# Patient Record
Sex: Female | Born: 1980 | Race: White | Hispanic: No | Smoking: Former smoker
Health system: Southern US, Community
[De-identification: ages and names within clinical notes are randomized; demographics above are authoritative.]

## PROBLEM LIST (undated history)

## (undated) DIAGNOSIS — F1911 Other psychoactive substance abuse, in remission: Secondary | ICD-10-CM

## (undated) DIAGNOSIS — F1910 Other psychoactive substance abuse, uncomplicated: Secondary | ICD-10-CM

## (undated) DIAGNOSIS — R001 Bradycardia, unspecified: Secondary | ICD-10-CM

## (undated) DIAGNOSIS — R55 Syncope and collapse: Secondary | ICD-10-CM

## (undated) DIAGNOSIS — F329 Major depressive disorder, single episode, unspecified: Secondary | ICD-10-CM

## (undated) DIAGNOSIS — F32A Depression, unspecified: Secondary | ICD-10-CM

## (undated) DIAGNOSIS — H9190 Unspecified hearing loss, unspecified ear: Secondary | ICD-10-CM

## (undated) DIAGNOSIS — R45851 Suicidal ideations: Secondary | ICD-10-CM

## (undated) DIAGNOSIS — E039 Hypothyroidism, unspecified: Secondary | ICD-10-CM

## (undated) DIAGNOSIS — S0990XA Unspecified injury of head, initial encounter: Secondary | ICD-10-CM

## (undated) DIAGNOSIS — T7840XA Allergy, unspecified, initial encounter: Secondary | ICD-10-CM

## (undated) DIAGNOSIS — N390 Urinary tract infection, site not specified: Secondary | ICD-10-CM

## (undated) DIAGNOSIS — F410 Panic disorder [episodic paroxysmal anxiety] without agoraphobia: Secondary | ICD-10-CM

## (undated) DIAGNOSIS — F319 Bipolar disorder, unspecified: Secondary | ICD-10-CM

## (undated) DIAGNOSIS — A6000 Herpesviral infection of urogenital system, unspecified: Secondary | ICD-10-CM

## (undated) DIAGNOSIS — I619 Nontraumatic intracerebral hemorrhage, unspecified: Secondary | ICD-10-CM

## (undated) DIAGNOSIS — G43909 Migraine, unspecified, not intractable, without status migrainosus: Secondary | ICD-10-CM

## (undated) DIAGNOSIS — F4310 Post-traumatic stress disorder, unspecified: Secondary | ICD-10-CM

## (undated) DIAGNOSIS — R0789 Other chest pain: Secondary | ICD-10-CM

## (undated) DIAGNOSIS — R768 Other specified abnormal immunological findings in serum: Secondary | ICD-10-CM

## (undated) DIAGNOSIS — G40909 Epilepsy, unspecified, not intractable, without status epilepticus: Secondary | ICD-10-CM

## (undated) DIAGNOSIS — K219 Gastro-esophageal reflux disease without esophagitis: Secondary | ICD-10-CM

## (undated) HISTORY — DX: Unspecified hearing loss, unspecified ear: H91.90

## (undated) HISTORY — DX: Unspecified injury of head, initial encounter: S09.90XA

## (undated) HISTORY — DX: Epilepsy, unspecified, not intractable, without status epilepticus: G40.909

## (undated) HISTORY — DX: Migraine, unspecified, not intractable, without status migrainosus: G43.909

## (undated) HISTORY — PX: OTHER SURGICAL HISTORY: SHX169

## (undated) HISTORY — DX: Bipolar disorder, unspecified: F31.9

## (undated) HISTORY — DX: Other chest pain: R07.89

## (undated) HISTORY — DX: Other psychoactive substance abuse, in remission: F19.11

## (undated) HISTORY — DX: Syncope and collapse: R55

## (undated) HISTORY — PX: WISDOM TOOTH EXTRACTION: SHX21

## (undated) HISTORY — DX: Post-traumatic stress disorder, unspecified: F43.10

## (undated) HISTORY — PX: HYSTEROSCOPY W/ ENDOMETRIAL ABLATION: SUR665

## (undated) HISTORY — PX: TONSILLECTOMY: SUR1361

## (undated) HISTORY — PX: REDUCTION MAMMAPLASTY: SUR839

## (undated) HISTORY — DX: Bradycardia, unspecified: R00.1

## (undated) HISTORY — DX: Herpesviral infection of urogenital system, unspecified: A60.00

## (undated) HISTORY — DX: Urinary tract infection, site not specified: N39.0

## (undated) HISTORY — DX: Allergy, unspecified, initial encounter: T78.40XA

---

## 2003-02-02 DIAGNOSIS — S0990XA Unspecified injury of head, initial encounter: Secondary | ICD-10-CM

## 2003-02-02 DIAGNOSIS — H9190 Unspecified hearing loss, unspecified ear: Secondary | ICD-10-CM

## 2003-02-02 HISTORY — DX: Unspecified hearing loss, unspecified ear: H91.90

## 2003-02-02 HISTORY — DX: Unspecified injury of head, initial encounter: S09.90XA

## 2003-11-16 ENCOUNTER — Emergency Department: Payer: Self-pay | Admitting: Emergency Medicine

## 2004-02-18 ENCOUNTER — Emergency Department: Payer: Self-pay | Admitting: Emergency Medicine

## 2005-04-08 ENCOUNTER — Emergency Department: Payer: Self-pay | Admitting: Emergency Medicine

## 2005-08-19 ENCOUNTER — Inpatient Hospital Stay (HOSPITAL_COMMUNITY): Admission: AD | Admit: 2005-08-19 | Discharge: 2005-08-19 | Payer: Self-pay | Admitting: Family Medicine

## 2005-10-21 ENCOUNTER — Ambulatory Visit: Payer: Self-pay | Admitting: Family Medicine

## 2005-10-28 ENCOUNTER — Ambulatory Visit: Admission: RE | Admit: 2005-10-28 | Discharge: 2005-10-28 | Payer: Self-pay | Admitting: Family Medicine

## 2005-11-09 ENCOUNTER — Ambulatory Visit (HOSPITAL_COMMUNITY): Admission: RE | Admit: 2005-11-09 | Discharge: 2005-11-09 | Payer: Self-pay | Admitting: Obstetrics & Gynecology

## 2006-02-08 ENCOUNTER — Ambulatory Visit (HOSPITAL_COMMUNITY): Admission: RE | Admit: 2006-02-08 | Discharge: 2006-02-08 | Payer: Self-pay | Admitting: Obstetrics & Gynecology

## 2006-03-10 ENCOUNTER — Ambulatory Visit: Payer: Self-pay | Admitting: *Deleted

## 2006-03-10 ENCOUNTER — Inpatient Hospital Stay (HOSPITAL_COMMUNITY): Admission: AD | Admit: 2006-03-10 | Discharge: 2006-03-10 | Payer: Self-pay | Admitting: Family Medicine

## 2006-03-23 ENCOUNTER — Inpatient Hospital Stay (HOSPITAL_COMMUNITY): Admission: RE | Admit: 2006-03-23 | Discharge: 2006-03-26 | Payer: Self-pay | Admitting: Obstetrics & Gynecology

## 2006-03-23 ENCOUNTER — Ambulatory Visit: Payer: Self-pay | Admitting: Obstetrics & Gynecology

## 2007-06-02 ENCOUNTER — Ambulatory Visit: Payer: Self-pay

## 2008-12-04 ENCOUNTER — Ambulatory Visit: Payer: Self-pay | Admitting: Family Medicine

## 2008-12-19 ENCOUNTER — Ambulatory Visit: Payer: Self-pay | Admitting: Obstetrics & Gynecology

## 2008-12-19 LAB — CONVERTED CEMR LAB: GC Probe Amp, Genital: NEGATIVE

## 2009-02-06 ENCOUNTER — Other Ambulatory Visit
Admission: RE | Admit: 2009-02-06 | Discharge: 2009-02-06 | Payer: Self-pay | Source: Home / Self Care | Admitting: Obstetrics & Gynecology

## 2009-02-06 ENCOUNTER — Ambulatory Visit: Payer: Self-pay | Admitting: Obstetrics and Gynecology

## 2009-02-14 ENCOUNTER — Emergency Department: Payer: Self-pay | Admitting: Emergency Medicine

## 2009-02-18 ENCOUNTER — Ambulatory Visit: Payer: Self-pay | Admitting: Obstetrics & Gynecology

## 2009-05-20 ENCOUNTER — Ambulatory Visit: Payer: Self-pay | Admitting: Obstetrics & Gynecology

## 2009-07-03 ENCOUNTER — Inpatient Hospital Stay (HOSPITAL_COMMUNITY): Admission: EM | Admit: 2009-07-03 | Discharge: 2009-07-05 | Payer: Self-pay

## 2009-07-04 ENCOUNTER — Encounter (INDEPENDENT_AMBULATORY_CARE_PROVIDER_SITE_OTHER): Payer: Self-pay | Admitting: Interventional Cardiology

## 2009-09-09 ENCOUNTER — Ambulatory Visit: Payer: Self-pay | Admitting: Family Medicine

## 2009-09-09 LAB — CONVERTED CEMR LAB
Chlamydia, DNA Probe: NEGATIVE
GC Probe Amp, Genital: NEGATIVE
WBC, Wet Prep HPF POC: NONE SEEN

## 2010-01-12 ENCOUNTER — Encounter: Payer: Self-pay | Admitting: Obstetrics and Gynecology

## 2010-01-12 ENCOUNTER — Ambulatory Visit: Payer: Self-pay | Admitting: Family Medicine

## 2010-01-12 LAB — CONVERTED CEMR LAB
Antibody Screen: NEGATIVE
Basophils Relative: 0 % (ref 0–1)
HCT: 44.4 % (ref 36.0–46.0)
HIV: NONREACTIVE
Hepatitis B Surface Ag: NEGATIVE
Lymphocytes Relative: 27 % (ref 12–46)
Lymphs Abs: 2.6 10*3/uL (ref 0.7–4.0)
Monocytes Absolute: 0.5 10*3/uL (ref 0.1–1.0)
Monocytes Relative: 5 % (ref 3–12)
RBC: 4.77 M/uL (ref 3.87–5.11)
RDW: 13.5 % (ref 11.5–15.5)
Rubella: 61.7 intl units/mL — ABNORMAL HIGH
WBC: 9.8 10*3/uL (ref 4.0–10.5)

## 2010-01-13 ENCOUNTER — Ambulatory Visit: Payer: Self-pay | Admitting: Nurse Practitioner

## 2010-01-20 ENCOUNTER — Ambulatory Visit: Payer: Self-pay | Admitting: Nurse Practitioner

## 2010-01-27 ENCOUNTER — Ambulatory Visit (HOSPITAL_COMMUNITY)
Admission: RE | Admit: 2010-01-27 | Discharge: 2010-01-27 | Payer: Self-pay | Source: Home / Self Care | Attending: Family Medicine | Admitting: Family Medicine

## 2010-01-27 ENCOUNTER — Ambulatory Visit: Payer: Self-pay | Admitting: Nurse Practitioner

## 2010-02-10 ENCOUNTER — Ambulatory Visit
Admission: RE | Admit: 2010-02-10 | Discharge: 2010-02-10 | Payer: Self-pay | Source: Home / Self Care | Attending: Family Medicine | Admitting: Family Medicine

## 2010-02-10 ENCOUNTER — Other Ambulatory Visit
Admission: RE | Admit: 2010-02-10 | Discharge: 2010-02-10 | Payer: Self-pay | Source: Home / Self Care | Admitting: Family Medicine

## 2010-02-20 ENCOUNTER — Encounter
Admission: RE | Admit: 2010-02-20 | Discharge: 2010-03-03 | Payer: Self-pay | Source: Home / Self Care | Attending: Family Medicine | Admitting: Family Medicine

## 2010-02-21 ENCOUNTER — Other Ambulatory Visit: Payer: Self-pay | Admitting: Family Medicine

## 2010-02-21 DIAGNOSIS — Z3682 Encounter for antenatal screening for nuchal translucency: Secondary | ICD-10-CM

## 2010-03-05 ENCOUNTER — Ambulatory Visit (HOSPITAL_COMMUNITY)
Admission: RE | Admit: 2010-03-05 | Discharge: 2010-03-05 | Disposition: A | Discharge status: Home / Self Care | Payer: Self-pay | Source: Ambulatory Visit | Attending: Family Medicine | Admitting: Family Medicine

## 2010-03-05 ENCOUNTER — Ambulatory Visit (HOSPITAL_COMMUNITY)
Admission: RE | Admit: 2010-03-05 | Discharge: 2010-03-05 | Disposition: A | Discharge status: Home / Self Care | Payer: Medicaid Other | Source: Ambulatory Visit | Attending: Family Medicine | Admitting: Family Medicine

## 2010-03-05 ENCOUNTER — Other Ambulatory Visit (HOSPITAL_COMMUNITY): Payer: Self-pay | Admitting: Maternal and Fetal Medicine

## 2010-03-05 DIAGNOSIS — Z3689 Encounter for other specified antenatal screening: Secondary | ICD-10-CM | POA: Insufficient documentation

## 2010-03-05 DIAGNOSIS — O3510X0 Maternal care for (suspected) chromosomal abnormality in fetus, unspecified, not applicable or unspecified: Secondary | ICD-10-CM | POA: Insufficient documentation

## 2010-03-05 DIAGNOSIS — O34219 Maternal care for unspecified type scar from previous cesarean delivery: Secondary | ICD-10-CM

## 2010-03-05 DIAGNOSIS — O351XX0 Maternal care for (suspected) chromosomal abnormality in fetus, not applicable or unspecified: Secondary | ICD-10-CM | POA: Insufficient documentation

## 2010-03-05 DIAGNOSIS — Z3682 Encounter for antenatal screening for nuchal translucency: Secondary | ICD-10-CM

## 2010-03-10 ENCOUNTER — Other Ambulatory Visit: Payer: Self-pay | Admitting: Family Medicine

## 2010-03-10 ENCOUNTER — Encounter: Payer: Medicaid Other | Admitting: Obstetrics and Gynecology

## 2010-03-10 DIAGNOSIS — Z3480 Encounter for supervision of other normal pregnancy, unspecified trimester: Secondary | ICD-10-CM

## 2010-03-10 DIAGNOSIS — Z3689 Encounter for other specified antenatal screening: Secondary | ICD-10-CM

## 2010-03-26 ENCOUNTER — Ambulatory Visit (HOSPITAL_COMMUNITY)
Admission: RE | Admit: 2010-03-26 | Discharge: 2010-03-26 | Disposition: A | Discharge status: Home / Self Care | Payer: Medicaid Other | Source: Ambulatory Visit | Attending: Family Medicine | Admitting: Family Medicine

## 2010-03-26 DIAGNOSIS — O351XX0 Maternal care for (suspected) chromosomal abnormality in fetus, not applicable or unspecified: Secondary | ICD-10-CM | POA: Insufficient documentation

## 2010-03-26 DIAGNOSIS — O3510X0 Maternal care for (suspected) chromosomal abnormality in fetus, unspecified, not applicable or unspecified: Secondary | ICD-10-CM | POA: Insufficient documentation

## 2010-03-26 DIAGNOSIS — Z3689 Encounter for other specified antenatal screening: Secondary | ICD-10-CM | POA: Insufficient documentation

## 2010-04-06 ENCOUNTER — Emergency Department (HOSPITAL_COMMUNITY)
Admission: EM | Admit: 2010-04-06 | Discharge: 2010-04-06 | Disposition: A | Discharge status: Home / Self Care | Payer: Medicaid Other | Attending: Emergency Medicine | Admitting: Emergency Medicine

## 2010-04-06 ENCOUNTER — Inpatient Hospital Stay (HOSPITAL_COMMUNITY)
Admission: AD | Admit: 2010-04-06 | Discharge: 2010-04-06 | Disposition: A | Discharge status: Home / Self Care | Payer: Medicaid Other | Source: Ambulatory Visit | Attending: Obstetrics & Gynecology | Admitting: Obstetrics & Gynecology

## 2010-04-06 DIAGNOSIS — O99340 Other mental disorders complicating pregnancy, unspecified trimester: Secondary | ICD-10-CM | POA: Insufficient documentation

## 2010-04-06 DIAGNOSIS — F3130 Bipolar disorder, current episode depressed, mild or moderate severity, unspecified: Secondary | ICD-10-CM | POA: Insufficient documentation

## 2010-04-06 DIAGNOSIS — E039 Hypothyroidism, unspecified: Secondary | ICD-10-CM | POA: Insufficient documentation

## 2010-04-07 ENCOUNTER — Encounter: Payer: Medicaid Other | Admitting: Obstetrics and Gynecology

## 2010-04-07 DIAGNOSIS — O34219 Maternal care for unspecified type scar from previous cesarean delivery: Secondary | ICD-10-CM

## 2010-04-07 DIAGNOSIS — Z3480 Encounter for supervision of other normal pregnancy, unspecified trimester: Secondary | ICD-10-CM

## 2010-04-13 ENCOUNTER — Other Ambulatory Visit: Payer: Self-pay | Admitting: Family Medicine

## 2010-04-13 ENCOUNTER — Other Ambulatory Visit (HOSPITAL_COMMUNITY): Payer: Self-pay | Admitting: Maternal and Fetal Medicine

## 2010-04-13 ENCOUNTER — Encounter (HOSPITAL_COMMUNITY): Payer: Self-pay

## 2010-04-13 ENCOUNTER — Ambulatory Visit (HOSPITAL_COMMUNITY)
Admission: RE | Admit: 2010-04-13 | Payer: Medicaid Other | Source: Ambulatory Visit | Attending: Maternal and Fetal Medicine | Admitting: Maternal and Fetal Medicine

## 2010-04-13 ENCOUNTER — Ambulatory Visit (HOSPITAL_COMMUNITY)
Admission: RE | Admit: 2010-04-13 | Discharge: 2010-04-13 | Disposition: A | Discharge status: Home / Self Care | Payer: Medicaid Other | Source: Ambulatory Visit | Attending: Family Medicine | Admitting: Family Medicine

## 2010-04-13 ENCOUNTER — Ambulatory Visit (HOSPITAL_COMMUNITY): Payer: Medicaid Other

## 2010-04-13 DIAGNOSIS — Z3689 Encounter for other specified antenatal screening: Secondary | ICD-10-CM

## 2010-04-13 DIAGNOSIS — O34219 Maternal care for unspecified type scar from previous cesarean delivery: Secondary | ICD-10-CM

## 2010-04-13 DIAGNOSIS — O99330 Smoking (tobacco) complicating pregnancy, unspecified trimester: Secondary | ICD-10-CM | POA: Insufficient documentation

## 2010-04-13 DIAGNOSIS — E079 Disorder of thyroid, unspecified: Secondary | ICD-10-CM | POA: Insufficient documentation

## 2010-04-13 DIAGNOSIS — IMO0002 Reserved for concepts with insufficient information to code with codable children: Secondary | ICD-10-CM

## 2010-04-13 DIAGNOSIS — O99340 Other mental disorders complicating pregnancy, unspecified trimester: Secondary | ICD-10-CM

## 2010-04-13 DIAGNOSIS — E039 Hypothyroidism, unspecified: Secondary | ICD-10-CM | POA: Insufficient documentation

## 2010-04-13 DIAGNOSIS — O358XX0 Maternal care for other (suspected) fetal abnormality and damage, not applicable or unspecified: Secondary | ICD-10-CM | POA: Insufficient documentation

## 2010-04-13 DIAGNOSIS — Z0489 Encounter for examination and observation for other specified reasons: Secondary | ICD-10-CM

## 2010-04-13 DIAGNOSIS — O99280 Endocrine, nutritional and metabolic diseases complicating pregnancy, unspecified trimester: Secondary | ICD-10-CM | POA: Insufficient documentation

## 2010-04-14 ENCOUNTER — Ambulatory Visit (HOSPITAL_COMMUNITY): Payer: Medicaid Other

## 2010-04-20 LAB — DIFFERENTIAL
Basophils Relative: 0 % (ref 0–1)
Eosinophils Relative: 1 % (ref 0–5)
Lymphocytes Relative: 22 % (ref 12–46)
Neutro Abs: 7.7 10*3/uL (ref 1.7–7.7)

## 2010-04-20 LAB — CBC
Hemoglobin: 13.6 g/dL (ref 12.0–15.0)
Hemoglobin: 14.7 g/dL (ref 12.0–15.0)
MCHC: 35.6 g/dL (ref 30.0–36.0)
MCV: 93.5 fL (ref 78.0–100.0)
MCV: 94.1 fL (ref 78.0–100.0)
Platelets: 186 10*3/uL (ref 150–400)
RBC: 4.5 MIL/uL (ref 3.87–5.11)
RDW: 12.8 % (ref 11.5–15.5)

## 2010-04-20 LAB — URINE MICROSCOPIC-ADD ON

## 2010-04-20 LAB — URINALYSIS, ROUTINE W REFLEX MICROSCOPIC
Leukocytes, UA: NEGATIVE
Nitrite: NEGATIVE
Urobilinogen, UA: 1 mg/dL (ref 0.0–1.0)

## 2010-04-20 LAB — PROTIME-INR: Prothrombin Time: 14 seconds (ref 11.6–15.2)

## 2010-04-20 LAB — SAMPLE TO BLOOD BANK

## 2010-04-20 LAB — POCT I-STAT, CHEM 8
Calcium, Ion: 1.04 mmol/L — ABNORMAL LOW (ref 1.12–1.32)
Glucose, Bld: 170 mg/dL — ABNORMAL HIGH (ref 70–99)
HCT: 44 % (ref 36.0–46.0)
TCO2: 21 mmol/L (ref 0–100)

## 2010-04-20 LAB — TSH: TSH: 0.859 u[IU]/mL (ref 0.350–4.500)

## 2010-04-20 LAB — RAPID URINE DRUG SCREEN, HOSP PERFORMED
Barbiturates: NOT DETECTED
Cocaine: POSITIVE — AB
Tetrahydrocannabinol: POSITIVE — AB

## 2010-04-20 LAB — ETHANOL: Alcohol, Ethyl (B): <5 mg/dL (ref 0–10)

## 2010-04-20 LAB — BASIC METABOLIC PANEL
CO2: 26 mEq/L (ref 19–32)
Calcium: 7.9 mg/dL — ABNORMAL LOW (ref 8.4–10.5)
Chloride: 113 mEq/L — ABNORMAL HIGH (ref 96–112)
Creatinine, Ser: 0.57 mg/dL (ref 0.4–1.2)
Glucose, Bld: 119 mg/dL — ABNORMAL HIGH (ref 70–99)
Sodium: 143 mEq/L (ref 135–145)

## 2010-04-20 LAB — CARDIAC PANEL(CRET KIN+CKTOT+MB+TROPI): Total CK: 2100 U/L — ABNORMAL HIGH (ref 7–177)

## 2010-04-21 ENCOUNTER — Encounter: Payer: Medicaid Other | Admitting: Obstetrics and Gynecology

## 2010-04-21 ENCOUNTER — Encounter: Payer: Self-pay | Admitting: Obstetrics & Gynecology

## 2010-04-21 DIAGNOSIS — E079 Disorder of thyroid, unspecified: Secondary | ICD-10-CM

## 2010-04-21 DIAGNOSIS — Z3480 Encounter for supervision of other normal pregnancy, unspecified trimester: Secondary | ICD-10-CM

## 2010-04-21 DIAGNOSIS — O99280 Endocrine, nutritional and metabolic diseases complicating pregnancy, unspecified trimester: Secondary | ICD-10-CM

## 2010-04-21 DIAGNOSIS — O99340 Other mental disorders complicating pregnancy, unspecified trimester: Secondary | ICD-10-CM

## 2010-04-21 DIAGNOSIS — O34219 Maternal care for unspecified type scar from previous cesarean delivery: Secondary | ICD-10-CM

## 2010-04-21 LAB — CONVERTED CEMR LAB
T3, Free: 2.9 pg/mL (ref 2.3–4.2)
TSH: 0.748 microintl units/mL (ref 0.350–4.500)

## 2010-05-11 ENCOUNTER — Ambulatory Visit (HOSPITAL_COMMUNITY)
Admission: RE | Admit: 2010-05-11 | Discharge: 2010-05-11 | Disposition: A | Discharge status: Home / Self Care | Payer: Medicaid Other | Source: Ambulatory Visit | Attending: Family Medicine | Admitting: Family Medicine

## 2010-05-11 ENCOUNTER — Other Ambulatory Visit: Payer: Self-pay | Admitting: Family Medicine

## 2010-05-11 DIAGNOSIS — Z3689 Encounter for other specified antenatal screening: Secondary | ICD-10-CM

## 2010-05-11 DIAGNOSIS — Z1389 Encounter for screening for other disorder: Secondary | ICD-10-CM | POA: Insufficient documentation

## 2010-05-11 DIAGNOSIS — Z363 Encounter for antenatal screening for malformations: Secondary | ICD-10-CM | POA: Insufficient documentation

## 2010-05-11 DIAGNOSIS — E079 Disorder of thyroid, unspecified: Secondary | ICD-10-CM

## 2010-05-11 DIAGNOSIS — O99280 Endocrine, nutritional and metabolic diseases complicating pregnancy, unspecified trimester: Secondary | ICD-10-CM | POA: Insufficient documentation

## 2010-05-11 DIAGNOSIS — O99340 Other mental disorders complicating pregnancy, unspecified trimester: Secondary | ICD-10-CM

## 2010-05-11 DIAGNOSIS — O2690 Pregnancy related conditions, unspecified, unspecified trimester: Secondary | ICD-10-CM

## 2010-05-11 DIAGNOSIS — E039 Hypothyroidism, unspecified: Secondary | ICD-10-CM | POA: Insufficient documentation

## 2010-05-11 DIAGNOSIS — O99330 Smoking (tobacco) complicating pregnancy, unspecified trimester: Secondary | ICD-10-CM | POA: Insufficient documentation

## 2010-05-11 DIAGNOSIS — O358XX0 Maternal care for other (suspected) fetal abnormality and damage, not applicable or unspecified: Secondary | ICD-10-CM | POA: Insufficient documentation

## 2010-05-19 ENCOUNTER — Encounter: Payer: Medicaid Other | Admitting: Obstetrics and Gynecology

## 2010-05-19 DIAGNOSIS — Z3480 Encounter for supervision of other normal pregnancy, unspecified trimester: Secondary | ICD-10-CM

## 2010-06-16 ENCOUNTER — Encounter (INDEPENDENT_AMBULATORY_CARE_PROVIDER_SITE_OTHER): Payer: Medicaid Other | Admitting: Obstetrics & Gynecology

## 2010-06-16 DIAGNOSIS — E079 Disorder of thyroid, unspecified: Secondary | ICD-10-CM

## 2010-06-16 DIAGNOSIS — Z3480 Encounter for supervision of other normal pregnancy, unspecified trimester: Secondary | ICD-10-CM

## 2010-06-16 DIAGNOSIS — O99340 Other mental disorders complicating pregnancy, unspecified trimester: Secondary | ICD-10-CM

## 2010-06-16 DIAGNOSIS — O34219 Maternal care for unspecified type scar from previous cesarean delivery: Secondary | ICD-10-CM

## 2010-06-16 NOTE — Assessment & Plan Note (Signed)
NAME:  Lindsey Davenport, Lindsey Davenport NO.:  [id number]   MEDICAL RECORD NO.:  [id number]          PATIENT TYPE:  POB   LOCATION:  [facility] at [facility]         FACILITY:  [facility]   PHYSICIAN:  Catalina Antigua, MD     DATE OF BIRTH:  20-May-1980   DATE OF SERVICE:  02/06/2009                                  CLINIC NOTE   This is a 30 year old para 3 who presents for annual exam.  The patient  was without any complaints.  She denies any abnormal bleeding or  discharge or pelvic pain since IUD placement in November 2010.  She  reports that her menses are now reduced to occasional spotting.  The  patient is satisfied with her current birth control method.   PAST MEDICAL HISTORY:  Migraines, hypothyroidism, bipolar disorder, and  attention deficit disorder.   PAST SURGICAL HISTORY:  She is C-section x3 and tonsillectomy.   PAST OBSTETRIC HISTORY:  She has had C-section x3 as well as 3  miscarriages.   PAST GYNECOLOGIC HISTORY:  She denies any history of cyst or fibroid or  any sexually transmitted disease.  She does have a history of abnormal  Pap smear followed by colposcopy and has been normal since.   ALLERGIES:  She denies any drug allergies.   MEDICATIONS:  Abilify, levothyroxine, alprazolam, topiramate, and  Vyvanse.   FAMILY HISTORY:  Significant for her grandfather with colon cancer.  Both grandmothers with breast cancer, heart disease, hypertension and  diabetes.   SOCIAL HISTORY:  She is a current smoker, she denies any alcohol abuse,  and she denies any use of illicit drugs over the past 6 years.  She has  a previous cocaine abuse.   REVIEW OF SYSTEMS:  Otherwise within normal limits.   PHYSICAL EXAMINATION:  VITAL SIGNS:  Blood pressure 111/76, pulse of 88,  weight of 137 pounds, height of 5 feet 3 inches.  LUNGS:  Clear to auscultation bilaterally.  HEART:  Regular rate rhythm.  BREASTS:  Equal in size.  No nipple discharge.  No palpable masses.  No  palpable  lymphadenopathy.  No skin dimpling.  ABDOMEN:  Soft, nontender, nondistended.  PELVIC:  She had normal vaginal mucosa.  No abnormal discharge or  bleeding.  Normal-appearing cervix with IUD strings visualized at the  external os.  Bimanual exam, she has small anteverted uterus.  No  palpable adnexal masses or tenderness.   ASSESSMENT AND PLAN:  This is a 30 year old para 3 who is here for  annual exam.  Pap smear was performed.  The patient was advised to  continue monthly string checks.  The patient will return in a year for  repeat annual exam or p.r.n.  The patient will be contacted with any  abnormal results.            ______________________________  Catalina Antigua, MD     PC/MEDQ  D:  02/06/2009  T:  02/07/2009  Job:  [id number]

## 2010-06-16 NOTE — Assessment & Plan Note (Signed)
NAME:  Lindsey Davenport, Lindsey Davenport NO.:  [id number]   MEDICAL RECORD NO.:  [id number]          PATIENT TYPE:  POB   LOCATION:  [facility]         FACILITY:  [facility]   PHYSICIAN:  Tinnie Gens, MD        DATE OF BIRTH:  [birth year]   DATE OF SERVICE:  09/09/2009                                  CLINIC NOTE   CHIEF COMPLAINT:  Vaginal discharge.   HISTORY OF PRESENT ILLNESS:  The patient is a 30 year old para 3 who has  a new partner.  She complains of a 1- to 2-week history of a white  vaginal discharge.  She has no dysuria.  She complains of abdominal pain  and tenderness.  She denies fevers, chills, nausea or vomiting.  She is  not currently using birth control and is not using consistent condoms.   PHYSICAL EXAMINATION:  VITAL SIGNS:  Blood pressure is 106/71, pulse 71.  GENERAL:  She is a well-developed, well-nourished female in no acute  distress.  ABDOMEN:  Soft, nontender, nondistended.  BACK:  No CVA tenderness.  GU:  Normal external female genitalia.  BUS is normal.  Vagina is pink  and rugated.  There is white discharge noted.  Cervix is nulliparous  without lesions.  There is no specific cervical motion tenderness, but  she has tenderness of the uterus and adnexa bilaterally.  No significant  masses are noted.   IMPRESSION:  Vaginal discharge, unclear etiology.  The patient denies  irritation or signs and symptoms of yeast.   PLAN:  Wet prep and GC and Chlamydia today.  We will call and treat her  based on the results.           ______________________________  Tinnie Gens, MD     TP/MEDQ  D:  09/09/2009  T:  09/10/2009  Job:  [id number]
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
## 2010-06-16 NOTE — Assessment & Plan Note (Signed)
NAME:  Lindsey Davenport, GAGAN NO.:  [id number]   MEDICAL RECORD NO.:  [id number]          PATIENT TYPE:  POB   LOCATION:  [facility]         FACILITY:  [facility]   PHYSICIAN:  Remonia Richter, NP   DATE OF BIRTH:  [birth year]   DATE OF SERVICE:  01/20/2010                                  CLINIC NOTE   The patient comes to office today for followup on her migraine  headaches.  The patient was seen 1 week ago for a new migraine headache  consultation.  She was 4 weeks' pregnant at that time.  She had come off  all of her medications including Topamax, Abilify, Xanax, and Vyvanse.  She had developed a headache that had not ended.  We did trigger point  injections.  Following the trigger point injection, she had been  headache free up until yesterday.  As of yesterday, the headache came  back, it was about a 9 on a scale of 1-10.  She took 2 doses of Flexeril  and 2 doses of Phenergan.  She would like to have a repeat of the  trigger point injections today.   Procedure note for trigger point injections.   ASSESSMENT:  Migraine in pregnancy.   PLAN:  The patient is given trigger point injections and occipital nerve  blocks.  She is given a prescription for a TENS unit for her neck and  back.  She will return in 1 week or sooner as need be.      Remonia Richter, NP     LR/MEDQ  D:  01/20/2010  T:  01/21/2010  Job:  [id number]
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
## 2010-06-16 NOTE — Assessment & Plan Note (Signed)
NAME:  Lindsey Davenport, KISER NO.:  [id number]   MEDICAL RECORD NO.:  [id number]          PATIENT TYPE:  POB   LOCATION:  [facility]         FACILITY:  [facility]   PHYSICIAN:  Allie Bossier, MD        DATE OF BIRTH:  10-24-1980   DATE OF SERVICE:  01/27/2010                                  CLINIC NOTE   The patient comes to the office today for followup on her migraine  headaches.  The patient is currently 7 weeks and 1 day pregnant by her  ultrasound.  She has a due date of August 14th.  She was last seen for  headaches on January 20, 2010.  She had trigger point injections on  that day.  Since then, she has not had any headache.  She does have some  muscle injection site pain only.  She was unable to get her TENS unit,  Medicaid is stating that she will have to have some 1 month delay and  some paperwork filled out.  She has sent that paperwork to this office.  She did take some Flexeril yesterday and that was helpful with pressure  sensation that she had in her head, but most importantly she has not had  any headache since her trigger point injections.   PHYSICAL EXAMINATION:  VITAL SIGNS:  Blood pressure is 118/71, pulse is  91, weight is 162, height is 5 feet 3 inches.  NECK:  The patient does have some tenderness bilaterally in her traps.   ASSESSMENT:  Migraine.  The patient is clearly improved.  We will give  her some lidocaine patch 12 hours on, 12 hours off, one box with two  refills to use on an as-needed basis.  She will also consider  integrative therapy for discomfort throughout her pregnancy, this may be  very good alternative for her.  She will return on an as-needed basis.      Remonia Richter, NP    ______________________________  Allie Bossier, MD    LR/MEDQ  D:  01/27/2010  T:  01/28/2010  Job:  [id number]

## 2010-06-16 NOTE — Assessment & Plan Note (Signed)
NAME:  Lindsey Davenport, Lindsey Davenport NO.:  [id number]   MEDICAL RECORD NO.:  [id number]          PATIENT TYPE:  POB   LOCATION:  [facility]         FACILITY:  [facility]   PHYSICIAN:  Elsie Lincoln, MD      DATE OF BIRTH:  Jan 25, 1981   DATE OF SERVICE:  12/19/2008                                  CLINIC NOTE   The patient is a 30 year old, para 3 female, who presents for IUD  insertion.  The patient has had 3 prior C-sections would like long-term  birth control.  The patient understands the risk of this procedure is  bleeding, infection, and damage to the back of the uterus, and  the IUD  to be placed into her intraperitoneal cavity.  The patient understands  that the present pregnancy is greater than 99% of the time, but then  will occur if that does not occur that it could be an ectopic pregnancy.   PAST MEDICAL HISTORY:  Migraines, hypothyroid, ADD, and bipolar.   PAST SURGICAL HISTORY:  C-section x3 and tonsillectomy.   GYNECOLOGIC HISTORY:  No history of ovarian cyst, fibroid tumor, and  sexually-transmitted diseases.  The patient has had abnormal Pap smear  in the past and all she had done was a rePap.  She does not know exactly  when her last Pap smear was done, but we are getting records from  [facility] and also [facility] of  her last Pap smears, so we can determine when she is due for her next  Pap smear and what is the interval.   MEDICATIONS:  Abilify, levothyroxine, alprazolam, Topiramate, vitamin A.   ALLERGIES:  Denies.   FAMILY HISTORY:  Both grandmothers have had clots one in the leg, one in  the lung.  Grandfather has had colon cancer.  Both grandmothers have  breast cancer, but first-degree relatives has had breast cancer.  Grandfathers have had heart disease and high blood pressure and  grandmother has diabetes.   REVIEW OF SYSTEMS:  Today is negative for any problems.   SOCIAL HISTORY:  She does smoke.   She does not drink alcohol.  She had  been addicted to cocaine in the past, but has been clean for 6 years.   PHYSICAL EXAMINATION:  GENERAL:  Well-nourished, well-developed, in no  apparent distress.  VITAL SIGNS:  Pulse 93, blood pressure 114/73, weight 133.5, height 5  feet 3 inches.  ABDOMEN:  Soft, nontender.  No rebound or guarding.  GENITALIA:  Tanner V.  Vagina pink, normal rugae.  Uterus anteverted.  Cervix nulliparous, small amount of blood in the vault.  The patient is  on her menses.   PROCEDURE:  Informed consent was obtained as dictated above.  Bivalve  speculum was placed into the patient's vagina and the anterior lip of  the cervix was grasped with single-tooth tenaculum.  The cervix clean  with Betadine.  The uterus sounded to approximately 8-1/2 cm and that  Mirena IUD was placed successfully.  Strings were cut to 3 cm.   ASSESSMENT AND PLAN:  A 30 year old para 3 female for intrauterine  device insertion.  1. Intrauterine device inserted without problem.  2.  The patient needs yearly exam and Pap smear in 6 weeks.  3. String track in 6 weeks.           ______________________________  Elsie Lincoln, MD     KL/MEDQ  D:  12/19/2008  T:  12/20/2008  Job:  [id number]

## 2010-06-16 NOTE — Assessment & Plan Note (Signed)
NAME:  Lindsey Davenport, Lindsey Davenport NO.:  [id number]   MEDICAL RECORD NO.:  [id number]          PATIENT TYPE:  POB   LOCATION:  [facility]         FACILITY:  [facility]   PHYSICIAN:  Scheryl Darter, MD       DATE OF BIRTH:  Jun 07, 1980   DATE OF SERVICE:                                  CLINIC NOTE   The patient returns today to discuss removal of her Mirena, which was  placed in November.  She has had very frequent bleeding since the  insertion, but her concern is that she felt quite irritable since it was  placed and she thinks that this symptom is being caused by the Mirena.  She discussed this with her psychiatrist who suggested that she should  try to have it removed to see if her symptoms resolve.  She understands  that the side effects she describes would not be common, and I cannot  guarantee her that removal would alleviate symptoms.  Her Abilify dose  was increased in the last week and she says that she has not noticed any  improvement.  She says that she would consider going back on oral  contraceptives, which she has tried before and did not cause this  particular side effect.  She cannot recall what type of pill she has  taken before.   PHYSICAL EXAMINATION:  GENERAL:  The patient is in no acute distress.  Her affect appears normal.  PELVIC:  External genitalia, vagina, and cervix showed some dark blood  and the string is visible about 2 cm.   She gave consent for removal and I grasped the string and removed the  IUD intact without any difficulty.  She has tolerated this well.  I gave  a prescription for Zarah, which is drospirenone and ethinyl estradiol  oral contraceptive pill.  She will notify us if she has any problems.  Recommend that she will return in 3 months to review her progress.      Scheryl Darter, MD     JA/MEDQ  D:  02/18/2009  T:  02/19/2009  Job:  [id number]
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
## 2010-06-19 NOTE — Op Note (Signed)
NAMESHARRONDA, Davenport NO.:  [id number]   MEDICAL RECORD NO.:  [id number]          PATIENT TYPE:  INP   LOCATION:  9141                          FACILITY:  [facility]   PHYSICIAN:  Lesly Dukes, M.D. DATE OF BIRTH:  1980-12-15   DATE OF PROCEDURE:  03/23/2006  DATE OF DISCHARGE:                               OPERATIVE REPORT   PREOPERATIVE DIAGNOSES:  1. Intrauterine pregnancy at term.  2. Previous cesarean sections x2.  3. History of herpes simplex virus.   POSTOPERATIVE DIAGNOSES:  1. Intrauterine pregnancy at term.  2. Previous cesarean sections x2.  3. History of herpes simplex virus.   PROCEDURE:  Repeat low transverse cesarean section.   SURGEON:  Lesly Dukes, M.D.   ASSISTANT:  Paticia Stack, MD.   ANESTHESIA:  Regional.   SPECIMEN:  Placenta sent to L&D.   ESTIMATED BLOOD LOSS:  500 mL.   COMPLICATIONS:  None.   FINDINGS:  Viable female infant, Apgars 9 and 9 at 1 and 5 minutes,  respectively.  Birth weight 9 pounds 2 ounces.   PROCEDURE:  The patient was taken to the operating room where her  regional anesthesia was found to be adequate.  She was then prepped and  draped in the normal sterile fashion in dorsal supine position with a  leftward tilt.  The Pfannenstiel skin incision was then made with a  scalpel and carried through to the underlying layer of fascia.  The  fascia was incised in the midline and the incision extended laterally  with the Mayo scissors.  The superior aspect of the fascial incision was  then grasped with the Kocher clamps, elevated and the underlying rectus  muscles dissected off bluntly.  Attention was then turned to the  inferior aspect of the incision which in similar fashion was grasped,  tented up with Kocher clamps and the rectus muscles dissected off  bluntly.  The rectus muscles were then separated in midline and the  peritoneum tented up and entered sharply with the Metzenbaum scissors.  The peritoneal  incision was then extended with good visualization of the  bladder.  The bladder blade was inserted and the sacrouterine peritoneum  identified, grasped with the pickups and entered sharply with Metzenbaum  scissors.  This incision was then extended laterally and a bladder flap  created digitally.  Once the bladder flap was created,  it was noted  there was a large window in the lower uterine segment.  It appears that  the bladder was holding the baby in place.  The bladder blade was then  reinserted and the lower uterine segment incised in a transverse fashion  with a scalpel.  The bladder blade was removed and the infant's head  delivered atraumatically.  The nose and mouth were suctioned and the  cord clamped and cut.  The infant was handed off to the awaiting  pediatricians.  The placenta was then removed manually.  The uterus  cleared of all clots and debris.  The uterine incision was repaired with  1-0 chromic in a running locked fashion with excellent hemostasis noted.  The  incision was reinspected multiple times with excellent hemostasis  noted.  The gutters were cleared of all clots.  The abdomen irrigated  and the uterine incision once again reinspected with good hemostasis  noted. The fascia was reapproximated with 0 Vicryl in a running fashion.  The skin was closed with staples.  The patient tolerated the procedure  well.  Sponge, lap and needle counts were correct x2.  We gave 1 gram of  Ancef at cord clamp.  The patient was taken to the recovery room in  stable condition.     ______________________________  Paticia Stack, MD    ______________________________  Lesly Dukes, M.D.    LNJ/MEDQ  D:  03/23/2006  T:  03/23/2006  Job:  [id number]

## 2010-06-19 NOTE — Discharge Summary (Signed)
NAMEMOLLEY, HOUSER NO.:  [id number]   MEDICAL RECORD NO.:  [id number]          PATIENT TYPE:  INP   LOCATION:  9141                          [facility]ACILITY:  [facility]   PHYSICIAN:  Lesly Dukes, M.D. DATE OF BIRTH:  April 21, 1980   DATE OF ADMISSION:  03/23/2006  DATE OF DISCHARGE:  03/26/2006                               DISCHARGE SUMMARY   DISCHARGE DIAGNOSES:  1. Status post repeat lower transverse cesarean section.  2. Term pregnancy, delivered.  3. Rubella nonimmune status, status post repeat rubella vaccine prior      to discharge.   DISCHARGE MEDICATIONS:  1. Ibuprofen 600 mg p.o. every six hours as needed for pain.  2. Percocet 5/325 mg, one to two tabs every four to six hours as      needed for pain.  3. Micronor one tab p.o. at the same time daily.  4. Colace 100 mg p.o. b.i.d. as needed for constipation.  5. Prenatal vitamins p.o. daily for six weeks postpartum or while      breastfeeding.   BRIEF HOSPITAL COURSE:  This is a 30 year old, G6, P3-0-3-3, who  presented at 39-3/7th's weeks' gestational age for a scheduled elective  repeat lower transverse cesarean section with a history of HSV and two  prior lower transverse cesarean sections.  The patient underwent repeat  lower transverse cesarean section without complication and delivered a  viable female infant with Apgar's of 9 at one minute and 9 at five  minutes.  The infant was circumcised prior to discharge.  Three-vessel  cord placenta was also delivered intact.   The patient is breast feeding at the time of discharge and desires a  postpartum tubal ligation.  She will be discharged on Micronor for the  interim.   PERTINENT LABORATORY DATA:  The patient's OB panel revealed O positive  blood type, antibody negative, RPR and HIV nonreactive, Hep B surface  antigen negative, Group B strep negative, and rubella nonimmune.  The  patient was given a rubella vaccine prior to discharge.  Hemoglobin is  11 on post-op day, March 24, 2006.   NEWBORN DATA:  Female infant, circumcised prior to discharge, weighing 9  pounds 2 ounces at birth, and measuring 19 and 1/2 inches in length.  Discharged home with mother with no complications.   DISCHARGE INSTRUCTIONS:  1. The patient is to avoid heavy lifting.  2. Nothing per vagina for 6 weeks.  3. The patient may follow a routine diet.  4. The patient is to follow up with the health department in 6 weeks      for her postpartum check.  5. The patient's Baby Love Nurse is to remove her staples on Monday or      Tuesday next week, which would be post-op day 6 or 7.   The patient was discharged home in stable condition with her husband and  infant.     ______________________________  Drue Dun, M.D.    ______________________________  Lesly Dukes, M.D.    EE/MEDQ  D:  03/26/2006  T:  03/26/2006  Job:  [id number]

## 2010-06-22 ENCOUNTER — Ambulatory Visit (HOSPITAL_COMMUNITY)
Admission: RE | Admit: 2010-06-22 | Discharge: 2010-06-22 | Disposition: A | Discharge status: Home / Self Care | Payer: Medicaid Other | Source: Ambulatory Visit | Attending: Family Medicine | Admitting: Family Medicine

## 2010-06-22 DIAGNOSIS — E039 Hypothyroidism, unspecified: Secondary | ICD-10-CM | POA: Insufficient documentation

## 2010-06-22 DIAGNOSIS — E079 Disorder of thyroid, unspecified: Secondary | ICD-10-CM | POA: Insufficient documentation

## 2010-06-22 DIAGNOSIS — O99330 Smoking (tobacco) complicating pregnancy, unspecified trimester: Secondary | ICD-10-CM | POA: Insufficient documentation

## 2010-06-22 DIAGNOSIS — O99340 Other mental disorders complicating pregnancy, unspecified trimester: Secondary | ICD-10-CM

## 2010-06-22 DIAGNOSIS — O99280 Endocrine, nutritional and metabolic diseases complicating pregnancy, unspecified trimester: Secondary | ICD-10-CM | POA: Insufficient documentation

## 2010-06-22 DIAGNOSIS — O2690 Pregnancy related conditions, unspecified, unspecified trimester: Secondary | ICD-10-CM

## 2010-06-30 ENCOUNTER — Encounter (INDEPENDENT_AMBULATORY_CARE_PROVIDER_SITE_OTHER): Payer: Medicaid Other | Admitting: Obstetrics & Gynecology

## 2010-06-30 DIAGNOSIS — E079 Disorder of thyroid, unspecified: Secondary | ICD-10-CM

## 2010-06-30 DIAGNOSIS — O99280 Endocrine, nutritional and metabolic diseases complicating pregnancy, unspecified trimester: Secondary | ICD-10-CM

## 2010-06-30 DIAGNOSIS — O34219 Maternal care for unspecified type scar from previous cesarean delivery: Secondary | ICD-10-CM

## 2010-06-30 DIAGNOSIS — Z3480 Encounter for supervision of other normal pregnancy, unspecified trimester: Secondary | ICD-10-CM

## 2010-06-30 DIAGNOSIS — O99340 Other mental disorders complicating pregnancy, unspecified trimester: Secondary | ICD-10-CM

## 2010-07-14 ENCOUNTER — Encounter (INDEPENDENT_AMBULATORY_CARE_PROVIDER_SITE_OTHER): Payer: Medicaid Other | Admitting: Family Medicine

## 2010-07-14 ENCOUNTER — Encounter: Payer: Medicaid Other | Admitting: Family Medicine

## 2010-07-14 DIAGNOSIS — O34219 Maternal care for unspecified type scar from previous cesarean delivery: Secondary | ICD-10-CM

## 2010-07-14 DIAGNOSIS — Z3480 Encounter for supervision of other normal pregnancy, unspecified trimester: Secondary | ICD-10-CM

## 2010-07-28 ENCOUNTER — Encounter (INDEPENDENT_AMBULATORY_CARE_PROVIDER_SITE_OTHER): Payer: Medicaid Other | Admitting: Family Medicine

## 2010-07-28 DIAGNOSIS — Z3480 Encounter for supervision of other normal pregnancy, unspecified trimester: Secondary | ICD-10-CM

## 2010-08-10 ENCOUNTER — Ambulatory Visit (INDEPENDENT_AMBULATORY_CARE_PROVIDER_SITE_OTHER): Payer: Medicaid Other | Admitting: Obstetrics and Gynecology

## 2010-08-10 ENCOUNTER — Encounter: Payer: Self-pay | Admitting: Obstetrics and Gynecology

## 2010-08-10 VITALS — BP 116/78 | Wt 185.0 lb

## 2010-08-10 DIAGNOSIS — Z0000 Encounter for general adult medical examination without abnormal findings: Secondary | ICD-10-CM

## 2010-08-10 NOTE — Progress Notes (Signed)
Pelvic pressure. Vag. Cultures next visit.

## 2010-08-11 ENCOUNTER — Encounter: Payer: Medicaid Other | Admitting: Obstetrics & Gynecology

## 2010-08-17 ENCOUNTER — Encounter (INDEPENDENT_AMBULATORY_CARE_PROVIDER_SITE_OTHER): Payer: Medicaid Other | Admitting: Obstetrics and Gynecology

## 2010-08-17 ENCOUNTER — Other Ambulatory Visit: Payer: Self-pay | Admitting: Obstetrics and Gynecology

## 2010-08-17 DIAGNOSIS — Z3480 Encounter for supervision of other normal pregnancy, unspecified trimester: Secondary | ICD-10-CM

## 2010-08-17 DIAGNOSIS — O34219 Maternal care for unspecified type scar from previous cesarean delivery: Secondary | ICD-10-CM

## 2010-08-18 LAB — GC/CHLAMYDIA PROBE AMP, GENITAL
Chlamydia, DNA Probe: NEGATIVE
GC Probe Amp, Genital: NEGATIVE

## 2010-08-24 ENCOUNTER — Other Ambulatory Visit: Payer: Self-pay | Admitting: Obstetrics and Gynecology

## 2010-08-24 ENCOUNTER — Encounter: Payer: Self-pay | Admitting: Obstetrics and Gynecology

## 2010-08-24 ENCOUNTER — Ambulatory Visit (INDEPENDENT_AMBULATORY_CARE_PROVIDER_SITE_OTHER): Payer: Medicaid Other | Admitting: Obstetrics and Gynecology

## 2010-08-24 DIAGNOSIS — F1920 Other psychoactive substance dependence, uncomplicated: Secondary | ICD-10-CM

## 2010-08-24 DIAGNOSIS — E079 Disorder of thyroid, unspecified: Secondary | ICD-10-CM

## 2010-08-24 DIAGNOSIS — Z3480 Encounter for supervision of other normal pregnancy, unspecified trimester: Secondary | ICD-10-CM

## 2010-08-24 DIAGNOSIS — O34219 Maternal care for unspecified type scar from previous cesarean delivery: Secondary | ICD-10-CM

## 2010-08-30 ENCOUNTER — Encounter (HOSPITAL_COMMUNITY)
Admission: AD | Disposition: A | Discharge status: Home / Self Care | Payer: Self-pay | Source: Ambulatory Visit | Attending: Obstetrics & Gynecology

## 2010-08-30 ENCOUNTER — Encounter (HOSPITAL_COMMUNITY): Payer: Self-pay | Admitting: Anesthesiology

## 2010-08-30 ENCOUNTER — Inpatient Hospital Stay (HOSPITAL_COMMUNITY)
Admission: AD | Admit: 2010-08-30 | Discharge: 2010-09-01 | DRG: 766 | Disposition: A | Discharge status: Home / Self Care | Payer: Medicaid Other | Source: Ambulatory Visit | Attending: Obstetrics & Gynecology | Admitting: Obstetrics & Gynecology

## 2010-08-30 ENCOUNTER — Other Ambulatory Visit: Payer: Self-pay | Admitting: Obstetrics & Gynecology

## 2010-08-30 ENCOUNTER — Encounter (HOSPITAL_COMMUNITY): Payer: Self-pay | Admitting: *Deleted

## 2010-08-30 ENCOUNTER — Inpatient Hospital Stay (HOSPITAL_COMMUNITY): Payer: Medicaid Other | Admitting: Anesthesiology

## 2010-08-30 DIAGNOSIS — Z302 Encounter for sterilization: Secondary | ICD-10-CM

## 2010-08-30 DIAGNOSIS — O34219 Maternal care for unspecified type scar from previous cesarean delivery: Secondary | ICD-10-CM

## 2010-08-30 DIAGNOSIS — F32A Depression, unspecified: Secondary | ICD-10-CM

## 2010-08-30 DIAGNOSIS — F329 Major depressive disorder, single episode, unspecified: Secondary | ICD-10-CM

## 2010-08-30 HISTORY — DX: Other psychoactive substance abuse, uncomplicated: F19.10

## 2010-08-30 LAB — CBC
MCH: 31.8 pg (ref 26.0–34.0)
MCHC: 34.3 g/dL (ref 30.0–36.0)
MCV: 92.7 fL (ref 78.0–100.0)
Platelets: 172 10*3/uL (ref 150–400)
RBC: 3.96 MIL/uL (ref 3.87–5.11)
RDW: 14.2 % (ref 11.5–15.5)

## 2010-08-30 LAB — TYPE AND SCREEN

## 2010-08-30 LAB — ABO/RH: ABO/RH(D): O POS

## 2010-08-30 SURGERY — Surgical Case
Anesthesia: Regional

## 2010-08-30 SURGERY — Surgical Case
Anesthesia: Spinal | Site: Abdomen | Laterality: Bilateral | Wound class: Clean Contaminated

## 2010-08-30 MED ORDER — LACTATED RINGERS IV SOLN
INTRAVENOUS | Status: DC
  Administered 2010-08-30 (×3): via INTRAVENOUS

## 2010-08-30 MED ORDER — FAMOTIDINE IN NACL 20-0.9 MG/50ML-% IV SOLN
INTRAVENOUS | Status: AC
  Administered 2010-08-30: 23:00:00
  Filled 2010-08-30: qty 50

## 2010-08-30 MED ORDER — BUPIVACAINE HCL 0.75 % IJ SOLN
INTRAMUSCULAR | Status: DC | PRN
  Administered 2010-08-30: 1.5 mL via INTRATHECAL

## 2010-08-30 MED ORDER — CITRIC ACID-SODIUM CITRATE 334-500 MG/5ML PO SOLN
ORAL | Status: AC
  Administered 2010-08-30: 30 mL via GASTROSTOMY
  Filled 2010-08-30: qty 15

## 2010-08-30 MED ORDER — CEFAZOLIN SODIUM 1-5 GM-% IV SOLN
INTRAVENOUS | Status: DC | PRN
  Administered 2010-08-30: 1 g via INTRAVENOUS

## 2010-08-30 MED ORDER — FENTANYL CITRATE 0.05 MG/ML IJ SOLN
INTRAMUSCULAR | Status: DC | PRN
  Administered 2010-08-30: 15 ug via INTRATHECAL

## 2010-08-30 MED ORDER — FENTANYL CITRATE 0.05 MG/ML IJ SOLN
INTRAMUSCULAR | Status: DC | PRN
  Administered 2010-08-30: 85 ug via INTRAVENOUS

## 2010-08-30 MED ORDER — ONDANSETRON HCL 4 MG/2ML IJ SOLN
INTRAMUSCULAR | Status: DC | PRN
  Administered 2010-08-30: 4 mg via INTRAVENOUS

## 2010-08-30 MED ORDER — SODIUM CHLORIDE 0.9 % IR SOLN
Status: DC | PRN
  Administered 2010-08-30: 1000 mL

## 2010-08-30 MED ORDER — OXYTOCIN 10 UNIT/ML IJ SOLN: INTRAMUSCULAR | Status: DC | PRN

## 2010-08-30 MED ORDER — MORPHINE SULFATE (PF) 0.5 MG/ML IJ SOLN
INTRAMUSCULAR | Status: DC | PRN
  Administered 2010-08-30 (×2): 1000 ug via INTRAVENOUS
  Administered 2010-08-30: 900 ug via INTRAVENOUS
  Administered 2010-08-30 (×2): 1000 ug via INTRAVENOUS

## 2010-08-30 MED ORDER — EPHEDRINE SULFATE 50 MG/ML IJ SOLN
INTRAMUSCULAR | Status: DC | PRN
  Administered 2010-08-30: 5 mg via INTRAVENOUS
  Administered 2010-08-30 (×2): 10 mg via INTRAVENOUS
  Administered 2010-08-30: 5 mg via INTRAVENOUS

## 2010-08-30 MED ORDER — MORPHINE SULFATE (PF) 0.5 MG/ML IJ SOLN
INTRAMUSCULAR | Status: DC | PRN
  Administered 2010-08-30: 100 ug via INTRATHECAL

## 2010-08-30 SURGICAL SUPPLY — 34 items
BLADE SURG CLIPPER 3M 9600 (MISCELLANEOUS) ×2 IMPLANT
CLOTH BEACON ORANGE TIMEOUT ST (SAFETY) ×2 IMPLANT
DERMABOND ADVANCED (GAUZE/BANDAGES/DRESSINGS) ×4 IMPLANT
DRAPE UTILITY XL STRL (DRAPES) ×2 IMPLANT
DURAPREP 26ML APPLICATOR (WOUND CARE) ×4 IMPLANT
ELECT REM PT RETURN 9FT ADLT (ELECTROSURGICAL) ×2
ELECTRODE REM PT RTRN 9FT ADLT (ELECTROSURGICAL) ×1 IMPLANT
EXTRACTOR VACUUM BELL STYLE (SUCTIONS) IMPLANT
GLOVE BIO SURGEON STRL SZ7 (GLOVE) ×2 IMPLANT
GLOVE BIOGEL PI IND STRL 7.5 (GLOVE) ×1 IMPLANT
GLOVE BIOGEL PI IND STRL 8 (GLOVE) ×2 IMPLANT
GLOVE BIOGEL PI INDICATOR 7.5 (GLOVE) ×1
GLOVE BIOGEL PI INDICATOR 8 (GLOVE) ×2
GLOVE ECLIPSE 8.0 STRL XLNG CF (GLOVE) ×2 IMPLANT
GOWN STRL REIN XL XLG (GOWN DISPOSABLE) ×4 IMPLANT
KIT ABG SYR 3ML LUER SLIP (SYRINGE) ×2 IMPLANT
NEEDLE HYPO 25X5/8 SAFETYGLIDE (NEEDLE) ×2 IMPLANT
NS IRRIG 1000ML POUR BTL (IV SOLUTION) ×4 IMPLANT
PACK C SECTION WH (CUSTOM PROCEDURE TRAY) ×2 IMPLANT
RTRCTR C-SECT PINK 25CM LRG (MISCELLANEOUS) IMPLANT
SLEEVE SCD COMPRESS KNEE MED (MISCELLANEOUS) ×2 IMPLANT
STAPLER VISISTAT 35W (STAPLE) IMPLANT
SUT CHROMIC 0 CT 1 (SUTURE) ×2 IMPLANT
SUT MNCRL 0 VIOLET CTX 36 (SUTURE) ×4 IMPLANT
SUT MONOCRYL 0 CTX 36 (SUTURE) ×4
SUT PLAIN 2 0 (SUTURE) ×2
SUT PLAIN 2 0 XLH (SUTURE) IMPLANT
SUT PLAIN ABS 2-0 CT1 27XMFL (SUTURE) ×2 IMPLANT
SUT VIC AB 0 CTX 36 (SUTURE) ×1
SUT VIC AB 0 CTX36XBRD ANBCTRL (SUTURE) ×1 IMPLANT
SUT VIC AB 4-0 KS 27 (SUTURE) ×2 IMPLANT
TOWEL OR 17X24 6PK STRL BLUE (TOWEL DISPOSABLE) ×4 IMPLANT
TRAY FOLEY CATH 14FR (SET/KITS/TRAYS/PACK) ×2 IMPLANT
WATER STERILE IRR 1000ML POUR (IV SOLUTION) ×2 IMPLANT

## 2010-08-30 SURGICAL SUPPLY — 29 items
CLOTH BEACON ORANGE TIMEOUT ST (SAFETY) ×2 IMPLANT
DERMABOND ADVANCED (GAUZE/BANDAGES/DRESSINGS) ×4 IMPLANT
DURAPREP 26ML APPLICATOR (WOUND CARE) ×4 IMPLANT
ELECT REM PT RETURN 9FT ADLT (ELECTROSURGICAL) ×2
ELECTRODE REM PT RTRN 9FT ADLT (ELECTROSURGICAL) ×1 IMPLANT
EXTRACTOR VACUUM BELL STYLE (SUCTIONS) IMPLANT
GLOVE BIOGEL PI IND STRL 8 (GLOVE) ×2 IMPLANT
GLOVE BIOGEL PI INDICATOR 8 (GLOVE) ×2
GLOVE ECLIPSE 8.0 STRL XLNG CF (GLOVE) ×2 IMPLANT
GOWN STRL REIN XL XLG (GOWN DISPOSABLE) ×6 IMPLANT
KIT ABG SYR 3ML LUER SLIP (SYRINGE) ×2 IMPLANT
NEEDLE HYPO 25X5/8 SAFETYGLIDE (NEEDLE) ×2 IMPLANT
NS IRRIG 1000ML POUR BTL (IV SOLUTION) ×2 IMPLANT
PACK C SECTION WH (CUSTOM PROCEDURE TRAY) ×2 IMPLANT
RTRCTR C-SECT PINK 25CM LRG (MISCELLANEOUS) IMPLANT
SLEEVE SCD COMPRESS KNEE MED (MISCELLANEOUS) IMPLANT
STAPLER VISISTAT 35W (STAPLE) IMPLANT
SUT CHROMIC 0 CT 1 (SUTURE) ×2 IMPLANT
SUT MNCRL 0 VIOLET CTX 36 (SUTURE) ×2 IMPLANT
SUT MONOCRYL 0 CTX 36 (SUTURE) ×2
SUT PLAIN 2 0 (SUTURE)
SUT PLAIN 2 0 XLH (SUTURE) IMPLANT
SUT PLAIN ABS 2-0 CT1 27XMFL (SUTURE) IMPLANT
SUT VIC AB 0 CTX 36 (SUTURE) ×1
SUT VIC AB 0 CTX36XBRD ANBCTRL (SUTURE) ×1 IMPLANT
SUT VIC AB 4-0 KS 27 (SUTURE) IMPLANT
TOWEL OR 17X24 6PK STRL BLUE (TOWEL DISPOSABLE) ×4 IMPLANT
TRAY FOLEY CATH 14FR (SET/KITS/TRAYS/PACK) IMPLANT
WATER STERILE IRR 1000ML POUR (IV SOLUTION) ×2 IMPLANT

## 2010-08-30 NOTE — Anesthesia Preprocedure Evaluation (Addendum)
Anesthesia Evaluation  Name, MR# and DOB Patient awake  General Assessment Comment  Reviewed: Allergy & Precautions, H&P  and Patient's Chart, lab work & pertinent test results  History of Anesthesia Complications Negative for: history of anesthetic complications  Airway Mallampati: II TM Distance: <3 FB Neck ROM: full    Dental No notable dental hx    Pulmonary  pneumonia  clear to auscultation    Cardiovascular Exercise Tolerance: Good regular Normal   Neuro/Psych (+) {AN ROS/MED HX NEURO HEADACHES (+) PSYCHIATRIC DISORDERS, Bipolar Disorder,  History of cocaine abuse - reports last use 2/12 GI/Hepatic/Renal negative Liver ROS, and negative Renal ROS (+)  GERD Medicated     Endo/Other   (+)  Hypothyroidism,  Abdominal   Musculoskeletal negative musculoskeletal ROS (+)  Hematology negative hematology ROS (+)   Peds  Reproductive/Obstetrics (+) Pregnancy   Anesthesia Other Findings Smoker/ cocaine use- last time in Feb.            Anesthesia Physical Anesthesia Plan  ASA: III and Emergent  Anesthesia Plan: Spinal   Post-op Pain Management:    Induction:   Airway Management Planned:   Additional Equipment:   Intra-op Plan:   Post-operative Plan:   Informed Consent: I have reviewed the patients History and Physical, chart, labs and discussed the procedure including the risks, benefits and alternatives for the proposed anesthesia with the patient or authorized representative who has indicated his/her understanding and acceptance.     Plan Discussed with: Surgeon and CRNA  Anesthesia Plan Comments:         Anesthesia Quick Evaluation

## 2010-08-30 NOTE — Progress Notes (Signed)
Pt taken to OR at this time.

## 2010-08-30 NOTE — H&P (Signed)
Lindsey Davenport is a 30 y.o. [id number] at  [redacted]w[redacted]d   presenting for rupture of membranes this evening with regular uterine contractions.  [id number]  [redacted]w[redacted]d  She has had 3 previous caesarean sections and also desires a tubal ligation.  EFM is reassuring, reactive.  She is having regular painful contractions.   History OB History    Grav Para Term Preterm Abortions TAB SAB Ect Mult Living   8 3 3  0 3  2 1  3      Past Medical History  Diagnosis Date  . Pneumonia   . Thyroid disorder   . Bipolar 1 disorder   . Hyperthyroidism   . Drug abuse     history of   Past Surgical History  Procedure Date  . Cesarean section     3 TIMES  . Tonsillectomy    Family History: family history is not on file. Social History:  reports that she has been smoking Cigarettes.  She does not have any smokeless tobacco history on file. She reports that she uses illicit drugs (Cocaine). She reports that she does not drink alcohol.  Review of Systems  Constitutional: Negative.   HENT: Negative.   Eyes: Negative.   Respiratory: Negative.   Cardiovascular: Negative.   Gastrointestinal: Negative.   Musculoskeletal: Negative.   Skin: Negative.   Neurological: Negative.   Endo/Heme/Allergies: Negative.     Dilation: 2 Effacement (%): 50 Station: -3 Exam by:: Dr Despina Hidden Blood pressure 125/73, pulse 67, temperature 98.3 F (36.8 C), temperature source Oral, resp. rate 20, height 5\' 3"  (1.6 m), weight 84.823 kg (187 lb), unknown if currently breastfeeding. Maternal Exam:  Uterine Assessment: Contraction strength is moderate.  Contraction duration is 45 seconds. Contraction frequency is regular.   Abdomen: Patient reports no abdominal tenderness. Surgical scars: low transverse.   Fundal height is 37 cm.   Estimated fetal weight is 7 lbs.   Fetal presentation: vertex  Introitus: Ferning test: positive.  Amniotic fluid character: clear.  Cervix: Cervix evaluated by digital exam.     Physical Exam    Constitutional: She is oriented to person, place, and time. She appears well-developed and well-nourished.  Neck: Normal range of motion.  Cardiovascular: Normal rate and regular rhythm.   Respiratory: Effort normal and breath sounds normal.  GI: Soft.  Neurological: She is alert and oriented to person, place, and time.  Skin: Skin is warm and dry.  Psychiatric: She has a normal mood and affect. Her behavior is normal.    Prenatal labs: ABO, Rh:   Antibody: NEG (12/12 2110) Rubella:   RPR: NON REAC (12/12 2110)  HBsAg: NEGATIVE (12/12 2110)  HIV: NON REACTIVE (12/12 2110)  GBS: NEGATIVE (07/23 1721)   Results for orders placed during the hospital encounter of 08/30/10 (from the past 24 hour(s))  CBC     Status: Normal   Collection Time   08/30/10  9:45 PM      Component Value Range   WBC 8.5  4.0 - 10.5 (K/uL)   RBC 3.96  3.87 - 5.11 (MIL/uL)   Hemoglobin 12.6  12.0 - 15.0 (g/dL)   HCT 65.7  84.6 - 96.2 (%)   MCV 92.7  78.0 - 100.0 (fL)   MCH 31.8  26.0 - 34.0 (pg)   MCHC 34.3  30.0 - 36.0 (g/dL)   RDW 95.2  84.1 - 32.4 (%)   Platelets 172  150 - 400 (K/uL)   Assessment/Plan: 1.. 37 weeks 5 days gestation 2.  SROM clear 3.  Early labor 4.  Previous caesarean section x 3  5.  Desires permanent sterilization  Plan:  Proceed with repeat caesarean section and btl. Pt understands the risks of surgery including but not limited t  excessive bleeding requiring transfusion or reoperation, post-operative infection requiring prolonged hospitalization or re-hospitalization and antibiotic therapy, and damage to other organs including bladder, bowel, ureters and major vessels.  The patient also understands the alternative treatment options which were discussed in full.  All questions were answered.   Lazaro Arms 08/30/2010 10:47 PM

## 2010-08-30 NOTE — Anesthesia Procedure Notes (Signed)
Spinal Block  Patient location during procedure: OR Start time: 08/30/2010 11:11 PM Staffing Anesthesiologist: CASSIDY, AMY L. Performed by: anesthesiologist  Preanesthetic Checklist Completed: patient identified, site marked, surgical consent, pre-op evaluation, timeout performed, IV checked, risks and benefits discussed and monitors and equipment checked Spinal Block Patient position: sitting Prep: DuraPrep Patient monitoring: heart rate, cardiac monitor, continuous pulse ox and blood pressure Approach: midline Location: L3-4 Injection technique: single-shot Needle Needle type: Sprotte  Needle gauge: 24 G Needle length: 5 cm Assessment Sensory level: T4 Additional Notes Patient crying out in pain with palpation of landmarks, very emotional/crying with injection of skin local.  Denied pain or paresthesia on insertion of spinal needle, aspiration or dosing.  Jasmine December, MD

## 2010-08-30 NOTE — Progress Notes (Signed)
Off monitor to move to OR-FHT 150 and reactive

## 2010-08-30 NOTE — Progress Notes (Signed)
?  ROM @ 1945, clear fluid.  Scheduled for repeat c/s 09-08-10, G7p3

## 2010-08-31 ENCOUNTER — Encounter (HOSPITAL_COMMUNITY): Payer: Self-pay | Admitting: *Deleted

## 2010-08-31 ENCOUNTER — Encounter: Payer: Medicaid Other | Admitting: Obstetrics and Gynecology

## 2010-08-31 LAB — HEMOGLOBIN: Hemoglobin: 11.6 g/dL — ABNORMAL LOW (ref 12.0–15.0)

## 2010-08-31 LAB — RPR: RPR Ser Ql: NONREACTIVE

## 2010-08-31 MED ORDER — SENNOSIDES-DOCUSATE SODIUM 8.6-50 MG PO TABS
1.0000 | ORAL_TABLET | Freq: Every day | ORAL | Status: DC
  Administered 2010-08-31: 2 via ORAL

## 2010-08-31 MED ORDER — KETOROLAC TROMETHAMINE 60 MG/2ML IM SOLN
INTRAMUSCULAR | Status: AC
  Administered 2010-08-31: 60 mg via INTRAMUSCULAR
  Filled 2010-08-31: qty 2

## 2010-08-31 MED ORDER — ZOLPIDEM TARTRATE 5 MG PO TABS: 5.0000 mg | ORAL_TABLET | Freq: Every evening | ORAL | Status: DC | PRN

## 2010-08-31 MED ORDER — DIPHENHYDRAMINE HCL 25 MG PO CAPS: 25.0000 mg | ORAL_CAPSULE | ORAL | Status: DC | PRN

## 2010-08-31 MED ORDER — MENTHOL 3 MG MT LOZG: 1.0000 | LOZENGE | OROMUCOSAL | Status: DC | PRN

## 2010-08-31 MED ORDER — LANOLIN HYDROUS EX OINT: 1.0000 "application " | TOPICAL_OINTMENT | CUTANEOUS | Status: DC | PRN

## 2010-08-31 MED ORDER — LEVOTHYROXINE SODIUM 75 MCG PO TABS
75.0000 ug | ORAL_TABLET | Freq: Every day | ORAL | Status: DC
  Administered 2010-08-31 – 2010-09-01 (×2): 75 ug via ORAL
  Filled 2010-08-31 (×3): qty 1

## 2010-08-31 MED ORDER — SERTRALINE HCL 50 MG PO TABS
50.0000 mg | ORAL_TABLET | Freq: Every day | ORAL | Status: DC
  Filled 2010-08-31: qty 1

## 2010-08-31 MED ORDER — SCOPOLAMINE 1 MG/3DAYS TD PT72
MEDICATED_PATCH | TRANSDERMAL | Status: AC
  Administered 2010-08-31: 1.5 mg via TRANSDERMAL
  Filled 2010-08-31: qty 1

## 2010-08-31 MED ORDER — NALBUPHINE HCL 10 MG/ML IJ SOLN
5.0000 mg | INTRAMUSCULAR | Status: DC | PRN
  Filled 2010-08-31: qty 1

## 2010-08-31 MED ORDER — SIMETHICONE 80 MG PO CHEW: 80.0000 mg | CHEWABLE_TABLET | ORAL | Status: DC | PRN

## 2010-08-31 MED ORDER — HYDROMORPHONE HCL 1 MG/ML IJ SOLN
0.5000 mg | INTRAMUSCULAR | Status: DC | PRN
  Administered 2010-08-31: 0.5 mg via INTRAVENOUS

## 2010-08-31 MED ORDER — DIPHENHYDRAMINE HCL 25 MG PO CAPS: 25.0000 mg | ORAL_CAPSULE | Freq: Four times a day (QID) | ORAL | Status: DC | PRN

## 2010-08-31 MED ORDER — SERTRALINE HCL 25 MG PO TABS
25.0000 mg | ORAL_TABLET | Freq: Once | ORAL | Status: AC
  Administered 2010-08-31: 25 mg via ORAL
  Filled 2010-08-31: qty 1

## 2010-08-31 MED ORDER — ONDANSETRON HCL 4 MG PO TABS: 4.0000 mg | ORAL_TABLET | ORAL | Status: DC | PRN

## 2010-08-31 MED ORDER — HYDROMORPHONE HCL 1 MG/ML IJ SOLN
1.0000 mg | Freq: Once | INTRAMUSCULAR | Status: AC
  Administered 2010-08-31: 1 mg via INTRAVENOUS
  Filled 2010-08-31: qty 1

## 2010-08-31 MED ORDER — PRENATAL PLUS 27-1 MG PO TABS
1.0000 | ORAL_TABLET | Freq: Every day | ORAL | Status: DC
  Administered 2010-08-31 – 2010-09-01 (×2): 1 via ORAL
  Filled 2010-08-31 (×2): qty 1

## 2010-08-31 MED ORDER — METOCLOPRAMIDE HCL 5 MG/ML IJ SOLN: 10.0000 mg | Freq: Three times a day (TID) | INTRAMUSCULAR | Status: DC | PRN

## 2010-08-31 MED ORDER — SCOPOLAMINE 1 MG/3DAYS TD PT72
1.0000 | MEDICATED_PATCH | Freq: Once | TRANSDERMAL | Status: DC
  Administered 2010-08-31: 1.5 mg via TRANSDERMAL

## 2010-08-31 MED ORDER — PRENATAL PLUS 27-1 MG PO TABS: 1.0000 | ORAL_TABLET | Freq: Every day | ORAL | Status: DC

## 2010-08-31 MED ORDER — KETOROLAC TROMETHAMINE 30 MG/ML IJ SOLN: 30.0000 mg | Freq: Four times a day (QID) | INTRAMUSCULAR | Status: AC | PRN

## 2010-08-31 MED ORDER — ONDANSETRON HCL 4 MG/2ML IJ SOLN: 4.0000 mg | Freq: Three times a day (TID) | INTRAMUSCULAR | Status: DC | PRN

## 2010-08-31 MED ORDER — ONDANSETRON HCL 4 MG/2ML IJ SOLN: 4.0000 mg | INTRAMUSCULAR | Status: DC | PRN

## 2010-08-31 MED ORDER — HYDROMORPHONE BOLUS VIA INFUSION: 1.0000 mg | Freq: Once | INTRAVENOUS | Status: DC

## 2010-08-31 MED ORDER — SODIUM CHLORIDE 0.9 % IJ SOLN: 3.0000 mL | INTRAMUSCULAR | Status: DC | PRN

## 2010-08-31 MED ORDER — LAMOTRIGINE 25 MG PO TABS
25.0000 mg | ORAL_TABLET | Freq: Every day | ORAL | Status: DC
  Filled 2010-08-31 (×3): qty 1

## 2010-08-31 MED ORDER — SIMETHICONE 80 MG PO CHEW
80.0000 mg | CHEWABLE_TABLET | Freq: Three times a day (TID) | ORAL | Status: DC
  Administered 2010-08-31 – 2010-09-01 (×6): 80 mg via ORAL

## 2010-08-31 MED ORDER — SODIUM CHLORIDE 0.9 % IV SOLN
1.0000 ug/kg/h | INTRAVENOUS | Status: DC | PRN
  Filled 2010-08-31: qty 2.5

## 2010-08-31 MED ORDER — HYDROMORPHONE HCL 1 MG/ML IJ SOLN
INTRAMUSCULAR | Status: AC
  Administered 2010-08-31: 0.5 mg via INTRAVENOUS
  Filled 2010-08-31: qty 1

## 2010-08-31 MED ORDER — CEFAZOLIN SODIUM 1-5 GM-% IV SOLN: 1.0000 g | INTRAVENOUS | Status: DC

## 2010-08-31 MED ORDER — OXYCODONE-ACETAMINOPHEN 5-325 MG PO TABS
1.0000 | ORAL_TABLET | ORAL | Status: DC | PRN
  Administered 2010-08-31: 1 via ORAL
  Administered 2010-08-31 – 2010-09-01 (×3): 2 via ORAL
  Filled 2010-08-31: qty 1
  Filled 2010-08-31 (×3): qty 2

## 2010-08-31 MED ORDER — MEPERIDINE HCL 25 MG/ML IJ SOLN: 6.2500 mg | INTRAMUSCULAR | Status: DC | PRN

## 2010-08-31 MED ORDER — WITCH HAZEL-GLYCERIN EX PADS: MEDICATED_PAD | CUTANEOUS | Status: DC | PRN

## 2010-08-31 MED ORDER — KETOROLAC TROMETHAMINE 60 MG/2ML IM SOLN
60.0000 mg | Freq: Once | INTRAMUSCULAR | Status: AC | PRN
  Administered 2010-08-31: 60 mg via INTRAMUSCULAR

## 2010-08-31 MED ORDER — TETANUS-DIPHTH-ACELL PERTUSSIS 5-2.5-18.5 LF-MCG/0.5 IM SUSP
0.5000 mL | Freq: Once | INTRAMUSCULAR | Status: AC
Start: 2010-09-01 — End: 2010-08-31
  Administered 2010-08-31: 0.5 mL via INTRAMUSCULAR
  Filled 2010-08-31: qty 0.5

## 2010-08-31 MED ORDER — NALOXONE HCL 0.4 MG/ML IJ SOLN: 0.4000 mg | INTRAMUSCULAR | Status: DC | PRN

## 2010-08-31 MED ORDER — OXYTOCIN 20 UNITS IN LACTATED RINGERS INFUSION - SIMPLE
125.0000 mL/h | INTRAVENOUS | Status: AC
  Administered 2010-08-31: 125 mL/h via INTRAVENOUS
  Filled 2010-08-31: qty 1000

## 2010-08-31 MED ORDER — IBUPROFEN 600 MG PO TABS
600.0000 mg | ORAL_TABLET | Freq: Four times a day (QID) | ORAL | Status: DC | PRN
  Administered 2010-08-31: 600 mg via ORAL
  Filled 2010-08-31 (×3): qty 1

## 2010-08-31 MED ORDER — IBUPROFEN 600 MG PO TABS
600.0000 mg | ORAL_TABLET | Freq: Four times a day (QID) | ORAL | Status: DC
  Administered 2010-08-31 – 2010-09-01 (×5): 600 mg via ORAL
  Filled 2010-08-31 (×3): qty 1

## 2010-08-31 MED ORDER — DIPHENHYDRAMINE HCL 50 MG/ML IJ SOLN: 25.0000 mg | INTRAMUSCULAR | Status: DC | PRN

## 2010-08-31 MED ORDER — SERTRALINE HCL 50 MG PO TABS
50.0000 mg | ORAL_TABLET | Freq: Every day | ORAL | Status: DC
  Administered 2010-09-01: 50 mg via ORAL
  Filled 2010-08-31 (×2): qty 1

## 2010-08-31 MED ORDER — DIPHENHYDRAMINE HCL 50 MG/ML IJ SOLN: 12.5000 mg | INTRAMUSCULAR | Status: DC | PRN

## 2010-08-31 NOTE — Anesthesia Procedure Notes (Deleted)
Procedures

## 2010-08-31 NOTE — Transfer of Care (Signed)
Immediate Anesthesia Transfer of Care Note  Patient: Lindsey Davenport  Procedure(s) Performed:  CESAREAN SECTION  Patient Location: PACU  Anesthesia Type: Spinal  Level of Consciousness: awake, alert  and oriented  Airway & Oxygen Therapy: Patient Spontanous Breathing  Post-op Assessment: Report given to PACU RN and Post -op Vital signs reviewed and stable  Post vital signs: stable  Complications: No apparent anesthesia complications

## 2010-08-31 NOTE — Consult Note (Signed)
Per mom "I really don't care for the football position" , Assisted mom in the cross-cradle ,challenging latch at first ,on and off pattern . Instructed and demostrated sandwiching the aerolo for a deeper latch . Obtained . Instructed mom on use of breast shells .

## 2010-08-31 NOTE — Progress Notes (Signed)
Infant presently in nursery for hearing screen. Encouraged mom to call for latch check .

## 2010-08-31 NOTE — Op Note (Signed)
Preoperative diagnosis:  1.  Intrauterine pregnancy at 37 weeks 5 days gestation                                         2.  previous cesarean section                                         3.  declines trial of labor                                         4.  Previous caesarean section x 3                                         5.  Desires permanent sterilization  Postoperative diagnosis:  Same  Procedure:  Repeat cesarean section with modified Pomeroy bilaterl tubal ligation  Surgeon:  Lazaro Arms MD  Assistant:   Anesthesia: Spinal  Findings:  Over a low transverse incision was delivered a viable female with Apgars of 8 and 9 with a weight to be determined nursery. Uterus tubes and ovaries were all normal.  Description of operation:  Patient was taken to the operating room and placed in the sitting position where she underwent a spinal anesthetic. She was then placed in the supine position with tilt to the left side. When adequate anesthetic level was obtained she was prepped and draped in usual sterile fashion and a Foley catheter was placed. A Pfannenstiel skin incision was made and carried down sharply to the rectus fascia which was scored in the midline extended laterally. The fascia was taken off the muscles both superiorly and without difficulty. The muscles were divided.  The peritoneal cavity was entered. The bladder flap was created because of a high riding bladder. Bladder blade was placed.  A low transverse hysterotomy incision was made over this incision was delivered a viable female infant at 2331 with Apgars of 8 and 9 with weight be determined in nursery.  Cord pH was obtained and 7.29. The uterus was exteriorized and wiped clean. It was closed in 2 layers, the first being a running interlocking layer and the second being an imbricating layer.  Several other interrupted 0 monocryl sutures were placed for hemostasis. There was good hemostasis and the uterus tubes and ovaries  were all normal. Both fallopian tubes were identified and a 3+ cm segment was removed using a modified Pomeroy technique.Peritoneal cavity was irrigated vigorously. The muscles and peritoneum were reapproximated loosely. The fascia was closed using 0 Vicryl in running fashion. Subcutaneous tissue was made hemostatic and irrigated. The skin was closed using 4-0 Vicryl on a Keith needle in a subcuticular fashion and Dermabond. Blood loss for the procedure was 700 cc. The patient received a gram of Ancef prophylactically. The patient was taken to the recovery room in good stable condition with all counts being correct x3.

## 2010-08-31 NOTE — Progress Notes (Signed)
Subjective: Postpartum Day 1: Cesarean Delivery Patient reports No complaints.   Objective: Vital signs in last 24 hours: Temp:  [97.8 F (36.6 C)-98.4 F (36.9 C)] 98.3 F (36.8 C) (07/30 0535) Pulse Rate:  [55-71] 58  (07/30 0535) Resp:  [16-22] 17  (07/30 0535) BP: (95-132)/(41-78) 107/62 mmHg (07/30 0535) SpO2:  [94 %-98 %] 97 % (07/30 0535) Weight:  [84.823 kg (187 lb)] 187 lb (84.823 kg) (07/29 2026)  Physical Exam:  General: alert, cooperative, appears stated age and no distress Lochia: appropriate Uterine Fundus: firm, appropriately tender Incision: healing well, sutures C/D/I DVT Evaluation: No evidence of DVT seen on physical exam.   Basename 08/31/10 0515 08/30/10 2145  HGB 11.6* 12.6  HCT -- 36.7    Assessment/Plan: Status post Cesarean section. Doing well postoperatively.  Continue current care.  [place] Dube E. 08/31/2010, 6:18 AM

## 2010-08-31 NOTE — Progress Notes (Signed)
UR chart review completed.  

## 2010-08-31 NOTE — Anesthesia Postprocedure Evaluation (Signed)
  Anesthesia Post-op Note  Patient: Lindsey Davenport  Procedure(s) Performed:  CESAREAN SECTION  No anesthesia complications.  Level of consciousness: alert. Cardiopulmonary status stable.  No follow-up care or observation required.  Alaiya Martindelcampo L. Rodman Pickle, MD

## 2010-09-01 MED ORDER — OXYCODONE-ACETAMINOPHEN 5-325 MG PO TABS: 1.0000 | ORAL_TABLET | ORAL | Status: AC | PRN

## 2010-09-01 MED ORDER — SENNOSIDES-DOCUSATE SODIUM 8.6-50 MG PO TABS: 1.0000 | ORAL_TABLET | Freq: Every day | ORAL | Status: DC

## 2010-09-01 MED ORDER — IBUPROFEN 600 MG PO TABS: 600.0000 mg | ORAL_TABLET | Freq: Four times a day (QID) | ORAL | Status: AC | PRN

## 2010-09-01 NOTE — Discharge Summary (Signed)
Obstetric Discharge Summary Reason for Admission: Labor, repeat c/s Prenatal Procedures: ultrasound Intrapartum Procedures: cesarean: low cervical, transverse Postpartum Procedures: P.P. tubal ligation Complications-Operative and Postpartum: none Hemoglobin  Date Value Range Status  08/31/2010 11.6* 12.0-15.0 (g/dL) Final     HCT  Date Value Range Status  08/30/2010 36.7  36.0-46.0 (%) Final    Discharge Diagnoses: Term Pregnancy-delivered  Discharge Information: Date: 09/01/2010 Activity: pelvic rest and no heavy lifting Diet: routine Medications: Ibuprophen, Colace, Iron and Percocet Condition: stable Instructions: refer to practice specific booklet Discharge to: home Follow-up Information    Follow up with [facility] in 6 weeks. (post partum visit)    Contact information:   [address] [place] [address]          Newborn Data: Live born female  Birth Weight: 6 lb 10.5 oz (3020 g) APGAR: 8, 9  Home with mother.  Marena Chancy 09/01/2010, 4:14 PM

## 2010-09-01 NOTE — Discharge Summary (Signed)
Physician Discharge Summary  Patient ID: Lindsey Davenport MRN: [id number] DOB/AGE: [birth year] 30 y.o.  Admit date: 08/30/2010 Discharge date: 09/01/2010  Admission Diagnoses: 1. IUP at 37.5 wk, 2. SROM, 3. Prev c/s, multiparity desired permanent sterilization  Discharge Diagnoses: Same , s/p RLTCS w/BTL  Discharged Condition: stable  Consults: none  Significant Diagnostic Studies:  Lab Results  Component Value Date   WBC 8.5 08/30/2010   HGB 11.6* 08/31/2010   HCT 36.7 08/30/2010   MCV 92.7 08/30/2010   PLT 172 08/30/2010     Significant procedures:  Same   Delivery Note At 11:31 PM a viable female was delivered via C-Section, Low Transverse (Presentation: ;  ).  APGAR: 8, 9; weight 6 lb 10.5 oz (3020 g).   Placenta status: Intact, Pathology.  Cord: 3 vessels with the following complications: .  Cord pH: n/a  Hospital Course: s/p RLTCS w/BTL.  Stable postop course and hemoglobin 11.6.  Pt stable and afebrile at time of discharge. Pt d/c'd home on postop day 2.   Disposition: Home or Self Care  Discharge Orders    Future Appointments: Provider: Department: Dept Phone: Center:   09/03/2010 9:15 AM [facility] [phone] None     Future Orders Please Complete By Expires   Diet general      Activity as tolerated      Lifting restrictions      Comments:   Weight restriction of 5-10 lbs for 2-3 weeks.   Sexual acrtivity      Comments:   Pelvic rest for 6 weeks.   Call MD for:  temperature >100.4      Call MD for:  persistant nausea and vomiting      Call MD for:  severe uncontrolled pain      Call MD for:  redness, tenderness, or signs of infection (pain, swelling, redness, odor or green/yellow discharge around incision site)      Call MD for:  difficulty breathing, headache or visual disturbances      Call MD for:  persistant dizziness or light-headedness        Current Discharge Medication List    START taking these medications   Details  ibuprofen  (ADVIL,MOTRIN) 600 MG tablet Take 1 tablet (600 mg total) by mouth every 6 (six) hours as needed for pain. Qty: 30 tablet, Refills: 1    oxyCODONE-acetaminophen (PERCOCET) 5-325 MG per tablet Take 1-2 tablets by mouth every 3 (three) hours as needed (moderate - severe pain). Qty: 45 tablet, Refills: 0    senna-docusate (SENOKOT-S) 8.6-50 MG per tablet Take 1-2 tablets by mouth at bedtime. Qty: 60 tablet, Refills: 0      CONTINUE these medications which have NOT CHANGED   Details  folic acid (FOLVITE) 1 MG tablet Take 1 mg by mouth daily.     levothyroxine (SYNTHROID, LEVOTHROID) 75 MCG tablet Take 75 mcg by mouth daily.     Omeprazole (PRILOSEC PO) Take 1 tablet by mouth at bedtime as needed. For heartburn      prenatal vitamin w/FE, FA (PRENATAL 1 + 1) 27-1 MG TABS Take 1 tablet by mouth daily.      promethazine (PHENERGAN) 25 MG tablet Take 25 mg by mouth every 6 (six) hours as needed. For nausea     sertraline (ZOLOFT) 50 MG tablet Take 50 mg by mouth daily.        STOP taking these medications     lamoTRIgine (LAMICTAL) 25 MG tablet  Prenat w/o A-FE-DSS-Methfol-FA (PRENATAL MULTIVITAMIN) 90-600-400 MG-MCG-MCG tablet        Follow-up Information    Follow up with [facility] in 6 weeks. (post partum visit)    Contact information:   [address] [place] [address]          Signed: Jalena Vanderlinden 09/01/2010, 4:25 PM

## 2010-09-01 NOTE — Progress Notes (Signed)
Subjective: Postpartum Day 2: Cesarean Delivery/BTL Patient reports tolerating PO, + flatus and no problems voiding.  Postop pain well controlled with medications.    Objective: Vital signs in last 24 hours: Temp:  [97.4 F (36.3 C)-98.2 F (36.8 C)] 98.2 F (36.8 C) (07/31 0610) Pulse Rate:  [51-87] 87  (07/31 0610) Resp:  [16-18] 16  (07/31 0610) BP: (97-114)/(62-77) 97/64 mmHg (07/31 0610) SpO2:  [93 %-98 %] 98 % (07/31 0610)  Physical Exam:  General: alert and cooperative Lochia: appropriate Uterine Fundus: firm; 1 below umbilicus  Incision: healing well, no significant drainage, no dehiscence, no significant erythema DVT Evaluation: No evidence of DVT seen on physical exam. Negative Homan's sign.   Basename 08/31/10 0515 08/30/10 2145  HGB 11.6* 12.6  HCT -- 36.7    Assessment/Plan: Status post Cesarean section. Doing well postoperatively.  Continue current care.  [facility] 09/01/2010, 7:17 AM

## 2010-09-01 NOTE — Consult Note (Signed)
Per mom and assessment sore left nipple with small scab . Review breast massage, hand expression and importance of using expressed breastmilk to nipples . Instructed on use of comfort gels after feedings and shells .

## 2010-09-03 ENCOUNTER — Other Ambulatory Visit (HOSPITAL_COMMUNITY): Payer: Medicaid Other

## 2010-09-08 ENCOUNTER — Inpatient Hospital Stay (HOSPITAL_COMMUNITY)
Admission: RE | Admit: 2010-09-08 | Payer: Medicaid Other | Source: Ambulatory Visit | Admitting: Obstetrics & Gynecology

## 2010-09-08 ENCOUNTER — Encounter (HOSPITAL_COMMUNITY): Admission: RE | Payer: Self-pay | Source: Ambulatory Visit

## 2010-09-08 SURGERY — Surgical Case
Anesthesia: Spinal

## 2010-09-24 ENCOUNTER — Other Ambulatory Visit: Payer: Medicaid Other

## 2010-09-24 NOTE — Progress Notes (Signed)
Patient feels her incision is having an increased odor and discomfort.  On exam excision and surrounding area has no redness, there is a small amount of discharge with odor.  The only problem that I can see is that her incision was closed with glue, it has been four weeks and the glue is still very much adhering to her incision.  I wet the area with peroxcide and loosened the glue, removing it with tweezers and scissors.  This was very uncomfortable for the patient, and under the glue the area was very irritated.  The actual incision line however looks great.  She is going to apply gauze to the area or use small pads to absorb any drainage and call me back if there continues to be an odor.  I feel the odor was coming from the glue not allowing the drainage to pass through.  She has 6 week pp check in two weeks.  She will call sooner if symptoms change or worsen.

## 2010-10-13 ENCOUNTER — Ambulatory Visit (INDEPENDENT_AMBULATORY_CARE_PROVIDER_SITE_OTHER): Payer: Medicaid Other | Admitting: Family Medicine

## 2010-10-13 ENCOUNTER — Encounter: Payer: Self-pay | Admitting: Family Medicine

## 2010-10-13 DIAGNOSIS — E039 Hypothyroidism, unspecified: Secondary | ICD-10-CM

## 2010-10-13 MED ORDER — DOXYCYCLINE HYCLATE 100 MG PO CAPS: 100.0000 mg | ORAL_CAPSULE | Freq: Two times a day (BID) | ORAL | Status: AC

## 2010-10-13 NOTE — Patient Instructions (Signed)
Hypothyroidism The thyroid is a large gland located in the lower front of your neck. The thyroid gland helps control metabolism. Metabolism is how your body handles food. It controls metabolism with the hormone thyroxine. When this gland is underactive (hypothyroid), it produces too little hormone.  SYMPTOMS OF HYPOTHYROIDISM  Lethargy (feeling as though you have no energy)   Cold intolerance   Weight gain (in spite of normal food intake)   Dry skin   Coarse hair   Menstrual irregularity (if severe, may lead to infertility)   Slowing of thought processes  Cardiac problems are also caused by insufficient amounts of thyroid hormone. Hypothyroidism in the newborn is cretinism, and is an extreme form. It is important that this form be treated adequately and immediately or it will lead rapidly to retarded physical and mental development. CAUSES OF HYPOTHYROIDISM These include:   Absence or destruction of thyroid tissue.  Goiter due to iodine deficiency.   Goiter due to medications.   Congenital defects (since birth).  Problems with the pituitary. This causes a lack of TSH (thyroid stimulating hormone). This hormone tells the thyroid to turn out more hormone.   DIAGNOSIS To prove hypothyroidism, your caregiver may do blood tests and ultrasound tests. Sometimes the signs are hidden. It may be necessary for your caregiver to watch this illness with blood tests either before or after diagnosis and treatment. TREATMENT  Low levels of thyroid hormone are increased by using synthetic thyroid hormone. This is a safe, effective treatment. It usually takes about four weeks to gain the full effects of the medication. After you have the full effect of the medication, it will generally take another four weeks for problems to leave. Your caregiver may start you on low doses. If you have had heart problems the dose may be gradually increased. It is generally not an emergency to get rapidly to  normal. HOME CARE INSTRUCTIONS  Take your medications as your caregiver suggests. Let your caregiver know of any medications you are taking or start taking. Your caregiver will help you with dosage schedules.   As your condition improves, your dosage needs may increase. It will be necessary to have continuing blood tests as suggested by your caregiver.   Report all suspected medication side effects to your caregiver.  SEEK MEDICAL CARE IF YOU DEVELOP:  Sweating.  Tremulousness (tremors).   Anxiety.   Rapid weight loss.   Heat intolerance.  Emotional swings.   Diarrhea.   Weakness.   SEEK IMMEDIATE MEDICAL CARE IF: You develop chest pain, an irregular heart beat (palpitations), or a rapid heart beat. MAKE SURE YOU:   Understand these instructions.   Will watch your condition.   Will get help right away if you are not doing well or get worse.  Document Released: 01/18/2005 Document Re-Released: 01/01/2008 [facility] Patient Information 2011 [place], [place].Endometritis Endometritis is an infection of the lining of the womb (uterus). This infection can extend to the muscle of the uterus and the tissues surrounding the uterus. This is usually an infection that follows delivery. It can follow a normal delivery, but the rate of infection is 5 to 10 times higher following cesarean delivery. Endometritis also rarely follows a D & C. This is a procedure done to scrape out the lining of the uterus. It is sometimes done following pregnancy, and when there is placenta which has been retained (did not come out) in the uterus. CAUSES  Infection from bacteria.   Too much handling of the uterus (  open incision with cesarean section) and manual removal of the placenta.   Reaction from the dissolving sutures in the uterus incision with a cesarean section.   Hematoma (collection of blood) in the uterus incision.  RISK FACTORS IN DEVELOPING ENDOMETRITIS:  Too many vaginal exams during  labor.  Premature rupture of membranes.   Prolonged labor, 24 hours or more.   Older age mothers.  Internal fetal monitoring.   Poor health of the mother.   Low red cell blood count (anemia).   SYMPTOMS  Fever, with or without chills.   Pain over the uterus area.   Vaginal discharge with a bad odor.  DIAGNOSIS  Culture of the discharge.  Blood tests.   Ultrasound or X-rays may be needed.   Examination of the patient with:  Fever.   Tender uterus.   Foul smelling vaginal discharge.   TREATMENT  Intravenous and later oral antibiotics.   Bed rest.  Usually antibiotics are given during cesarean section to prevent endometritis. HOME CARE INSTRUCTIONS  Only take over-the-counter or prescription medicines for pain, discomfort, or fever as directed by your caregiver.   You may resume normal diet and activities as directed or as tolerated. Do not douche or engage in intercourse until your caregiver has given permission to do so.   Take all medications as directed. Finish all antibiotics even if you feel better after a couple days.  SEEK IMMEDIATE MEDICAL CARE IF:  There is swelling or increasing pain in the abdomen.   An unexplained oral temperature above 101, or as your caregiver suggests.   You notice a bad smell or increase in the discharge coming from the vagina, or increased bleeding.   There is a breaking open of a wound after sutures have been removed, if your baby was delivered by cesarean delivery.   Pus like discharge coming from the cesarean section incision.   Your pain is severe and is not responding to pain medication.   You develop severe nausea and vomiting or cannot keep foods down.   Your pain localizes to the right lower part of your abdomen (possible appendicitis).   You see blood in your stool.  MAKE SURE YOU:   Understand these instructions.   Will watch your condition.   Will get help right away if you are not doing well or get  worse.  Document Released: 01/12/2001 Document Re-Released: 01/01/2008 [facility] Patient Information 2011 [place], [place].

## 2010-10-13 NOTE — Progress Notes (Signed)
  Subjective:     Lindsey Davenport is a 30 y.o. female who presents for a postpartum visit. She is 6 weeks postpartum following a low cervical transverse Cesarean section. I have fully reviewed the prenatal and intrapartum course. The delivery was at 37 gestational weeks. Outcome: primary cesarean section, low transverse incision. Anesthesia: spinal. Postpartum course has been uneventful. Baby's course has been milk allergy. Baby is feeding by breast. Bleeding moderate lochia. Bowel function is normal. Bladder function is normal. Patient is not sexually active. Contraception method is tubal ligation. Postpartum depression screening: negative.Is bleeding and tender, denies fever.  The following portions of the patient's history were reviewed and updated as appropriate: allergies, current medications, past family history, past medical history, past social history, past surgical history and problem list.  Review of Systems Pertinent items are noted in HPI.   Objective:    BP 129/85  Pulse 82  Wt 163 lb (73.936 kg)  Breastfeeding? Yes  General:  alert and cooperative           Abdomen: soft, non-tender; bowel sounds normal; no masses,  no organomegaly   Vulva:  normal  Vagina: normal vagina  Cervix:  anteverted  Corpus: enlarged, 8 weeks size, regular contour and tender  Adnexa:  normal adnexa           Assessment:    6 wk postpartum exam. Pap smear not done at today's visit. ? Endometritis Plan:    1. Contraception: tubal ligation 2. Check TSH 3.  Doxycycline 3. Follow up in: 4 months or as needed.

## 2010-10-14 LAB — TSH: TSH: 1.31 u[IU]/mL (ref 0.350–4.500)

## 2010-10-27 ENCOUNTER — Encounter (HOSPITAL_COMMUNITY): Payer: Self-pay | Admitting: Obstetrics & Gynecology

## 2011-02-02 HISTORY — PX: TUBAL LIGATION: SHX77

## 2011-03-31 ENCOUNTER — Inpatient Hospital Stay (HOSPITAL_COMMUNITY)
Admission: AD | Admit: 2011-03-31 | Discharge: 2011-03-31 | Disposition: A | Discharge status: Home / Self Care | Payer: Medicaid Other | Source: Ambulatory Visit | Attending: Obstetrics & Gynecology | Admitting: Obstetrics & Gynecology

## 2011-03-31 ENCOUNTER — Encounter (HOSPITAL_COMMUNITY): Payer: Self-pay | Admitting: *Deleted

## 2011-03-31 ENCOUNTER — Inpatient Hospital Stay (HOSPITAL_COMMUNITY): Payer: Medicaid Other

## 2011-03-31 DIAGNOSIS — J45909 Unspecified asthma, uncomplicated: Secondary | ICD-10-CM | POA: Insufficient documentation

## 2011-03-31 DIAGNOSIS — R109 Unspecified abdominal pain: Secondary | ICD-10-CM | POA: Insufficient documentation

## 2011-03-31 DIAGNOSIS — B009 Herpesviral infection, unspecified: Secondary | ICD-10-CM | POA: Insufficient documentation

## 2011-03-31 DIAGNOSIS — N949 Unspecified condition associated with female genital organs and menstrual cycle: Secondary | ICD-10-CM | POA: Insufficient documentation

## 2011-03-31 DIAGNOSIS — J3489 Other specified disorders of nose and nasal sinuses: Secondary | ICD-10-CM | POA: Insufficient documentation

## 2011-03-31 DIAGNOSIS — R51 Headache: Secondary | ICD-10-CM | POA: Insufficient documentation

## 2011-03-31 DIAGNOSIS — N920 Excessive and frequent menstruation with regular cycle: Secondary | ICD-10-CM

## 2011-03-31 DIAGNOSIS — F319 Bipolar disorder, unspecified: Secondary | ICD-10-CM | POA: Insufficient documentation

## 2011-03-31 DIAGNOSIS — F17200 Nicotine dependence, unspecified, uncomplicated: Secondary | ICD-10-CM | POA: Insufficient documentation

## 2011-03-31 DIAGNOSIS — Z8701 Personal history of pneumonia (recurrent): Secondary | ICD-10-CM | POA: Insufficient documentation

## 2011-03-31 HISTORY — DX: Panic disorder (episodic paroxysmal anxiety): F41.0

## 2011-03-31 HISTORY — DX: Depression, unspecified: F32.A

## 2011-03-31 HISTORY — DX: Major depressive disorder, single episode, unspecified: F32.9

## 2011-03-31 LAB — WET PREP, GENITAL
Clue Cells Wet Prep HPF POC: NONE SEEN
Trich, Wet Prep: NONE SEEN
Yeast Wet Prep HPF POC: NONE SEEN

## 2011-03-31 LAB — CBC
Hemoglobin: 13.5 g/dL (ref 12.0–15.0)
Platelets: 262 10*3/uL (ref 150–400)
RBC: 4.44 MIL/uL (ref 3.87–5.11)
WBC: 9.9 10*3/uL (ref 4.0–10.5)

## 2011-03-31 MED ORDER — MEDROXYPROGESTERONE ACETATE 10 MG PO TABS: 10.0000 mg | ORAL_TABLET | Freq: Every day | ORAL | Status: DC

## 2011-03-31 MED ORDER — KETOROLAC TROMETHAMINE 60 MG/2ML IM SOLN
60.0000 mg | Freq: Once | INTRAMUSCULAR | Status: AC
  Administered 2011-03-31: 60 mg via INTRAMUSCULAR
  Filled 2011-03-31: qty 2

## 2011-03-31 MED ORDER — NAPROXEN SODIUM 550 MG PO TABS: 550.0000 mg | ORAL_TABLET | Freq: Two times a day (BID) | ORAL | Status: DC

## 2011-03-31 NOTE — ED Provider Notes (Signed)
History     CSN: [id number]  Arrival date & time 03/31/11  1156   None     Chief Complaint  Patient presents with  . Vaginal Bleeding    HPI Lindsey Davenport is a 31 y.o. female who presents to [facility]U for vaginal bleeding that started 10 days ago. Goes to [facility] for [employer]. Called the office and told to come to MAU. Current sex partner x 3 years. The history was provided by the patient.  Last Vitals:  Filed Vitals:   03/31/11 1257  BP: 99/64  Pulse: 55  Temp: 98.4 F (36.9 C)  Resp: 16     Past Medical History  Diagnosis Date  . Pneumonia   . Thyroid disorder   . Bipolar 1 disorder   . Hyperthyroidism   . Drug abuse     history of  . Headache   . Depression   . Anxiety   . Panic attack   . Asthma   . Herpes     Past Surgical History  Procedure Date  . Tonsillectomy   . Cesarean section     4 TIMES  . Cesarean section 08/30/2010    Procedure: CESAREAN SECTION;  Surgeon: Lazaro Arms, MD;  Location: [facility];  Service: Gynecology;  Laterality: N/A;    Family History  Problem Relation Age of Onset  . Cancer Paternal Grandmother     breast  . Cancer Maternal Grandmother     breast  . Cancer Maternal Grandfather     colon    History  Substance Use Topics  . Smoking status: Current Some Day Smoker -- 0.2 packs/day    Types: Cigarettes  . Smokeless tobacco: Not on file   Comment: 1/2 PACK DAILY  . Alcohol Use: No    OB History    Grav Para Term Preterm Abortions TAB SAB Ect Mult Living   8 4 4  0 3  2 1  4       Review of Systems  Constitutional: Negative for fever, chills, diaphoresis and fatigue.  HENT: Positive for congestion. Negative for ear pain, sore throat, facial swelling, neck pain, neck stiffness, dental problem and sinus pressure.   Eyes: Negative for photophobia, pain and discharge.  Respiratory: Negative for cough, chest tightness and wheezing.   Cardiovascular: Negative.   Gastrointestinal: Positive for abdominal pain.  Negative for nausea, vomiting, diarrhea, constipation and abdominal distention.  Genitourinary: Positive for vaginal bleeding and pelvic pain. Negative for dysuria, urgency, frequency, flank pain, vaginal discharge and difficulty urinating.  Musculoskeletal: Negative for myalgias, back pain and gait problem.  Skin: Negative for color change and rash.  Neurological: Positive for headaches. Negative for dizziness, speech difficulty, weakness, light-headedness and numbness.  Psychiatric/Behavioral: Negative for confusion and agitation.       Bipolar    Allergies  Review of patient's allergies indicates no known allergies.  Home Medications  No current outpatient prescriptions on file.  BP 99/64  Pulse 55  Temp(Src) 98.4 F (36.9 C) (Oral)  Resp 16  Ht 5\' 3"  (1.6 m)  Wt 157 lb 3.2 oz (71.305 kg)  BMI 27.85 kg/m2  SpO2 100%  LMP 03/21/2011  Physical Exam  Nursing note and vitals reviewed. Constitutional: She is oriented to person, place, and time. She appears well-developed and well-nourished. No distress.  HENT:  Head: Normocephalic.  Eyes: EOM are normal.  Neck: Neck supple.  Cardiovascular: Normal rate.   Pulmonary/Chest: Effort normal.  Abdominal: Soft. There is no tenderness.  Genitourinary:       External genitalia without lesions. Moderate blood vaginal vault. CMT, bilateral adnexal tenderness. Uterus without palpable enlargement.  Musculoskeletal: Normal range of motion.  Neurological: She is alert and oriented to person, place, and time. No cranial nerve deficit.  Skin: Skin is warm and dry.  Psychiatric: She has a normal mood and affect. Her behavior is normal. Judgment and thought content normal.   Results for orders placed during the hospital encounter of 03/31/11 (from the past 24 hour(s))  CBC     Status: Normal   Collection Time   03/31/11 11:50 AM      Component Value Range   WBC 9.9  4.0 - 10.5 (K/uL)   RBC 4.44  3.87 - 5.11 (MIL/uL)   Hemoglobin 13.5   12.0 - 15.0 (g/dL)   HCT 16.1  09.6 - 04.5 (%)   MCV 90.3  78.0 - 100.0 (fL)   MCH 30.4  26.0 - 34.0 (pg)   MCHC 33.7  30.0 - 36.0 (g/dL)   RDW 40.9  81.1 - 91.4 (%)   Platelets 262  150 - 400 (K/uL)  WET PREP, GENITAL     Status: Abnormal   Collection Time   03/31/11  1:45 PM      Component Value Range   Yeast Wet Prep HPF POC NONE SEEN  NONE SEEN    Trich, Wet Prep NONE SEEN  NONE SEEN    Clue Cells Wet Prep HPF POC NONE SEEN  NONE SEEN    WBC, Wet Prep HPF POC FEW (*) NONE SEEN   POCT PREGNANCY, URINE     Status: Normal   Collection Time   03/31/11  1:58 PM      Component Value Range   Preg Test, Ur NEGATIVE  NEGATIVE    US Transvaginal Non-ob  03/31/2011  *RADIOLOGY REPORT*  Clinical Data: Abnormal bleeding.  Menorrhagia for 10 days.  TRANSABDOMINAL AND TRANSVAGINAL ULTRASOUND OF PELVIS Technique:  Both transabdominal and transvaginal ultrasound examinations of the pelvis were performed. Transabdominal technique was performed for global imaging of the pelvis including uterus, ovaries, adnexal regions, and pelvic cul-de-sac.  Comparison: None.   It was necessary to proceed with endovaginal exam following the transabdominal exam to visualize the endometrium right ovary.  Findings:  Uterus: Measures 10.5 x 4.7 x 5.5 cm.  Normal in echogenicity.  No focal mass is identified.  Endometrium: Measures 5.5 mm in thickness and is homogeneous.  Right ovary:  Normal in appearance.  No ovarian or adnexal mass. Right ovary measures 2.1 x 1.5 x 1.4 cm.  Left ovary: Normal appearance.  No ovarian or adnexal mass. Measures 1.8 x 1.2 x 1.5 cm.  Other findings: No free fluid.  IMPRESSION: Normal study. No evidence of pelvic mass or other significant abnormality.  Original Report Authenticated By: Britta Mccreedy, M.D.   US Pelvis Complete  03/31/2011  *RADIOLOGY REPORT*  Clinical Data: Abnormal bleeding.  Menorrhagia for 10 days.  TRANSABDOMINAL AND TRANSVAGINAL ULTRASOUND OF PELVIS Technique:  Both  transabdominal and transvaginal ultrasound examinations of the pelvis were performed. Transabdominal technique was performed for global imaging of the pelvis including uterus, ovaries, adnexal regions, and pelvic cul-de-sac.  Comparison: None.   It was necessary to proceed with endovaginal exam following the transabdominal exam to visualize the endometrium right ovary.  Findings:  Uterus: Measures 10.5 x 4.7 x 5.5 cm.  Normal in echogenicity.  No focal mass is identified.  Endometrium: Measures 5.5 mm in thickness and is  homogeneous.  Right ovary:  Normal in appearance.  No ovarian or adnexal mass. Right ovary measures 2.1 x 1.5 x 1.4 cm.  Left ovary: Normal appearance.  No ovarian or adnexal mass. Measures 1.8 x 1.2 x 1.5 cm.  Other findings: No free fluid.  IMPRESSION: Normal study. No evidence of pelvic mass or other significant abnormality.  Original Report Authenticated By: Britta Mccreedy, M.D.   Assessment: Menorrhagia  Plan:  Toradol 60 mg IM now   Provera 10 mg po daily Rx   Anaprox DS Rx   Follow up in office  ED Course: Consult with Dr. Macon Large will start on Provera and she is to follow up in the [facility] office.  Procedures    MDM          Kerrie Buffalo, NP 03/31/11 1459

## 2011-03-31 NOTE — Discharge Instructions (Signed)
YOUR ULTRASOUND AND LAB WORK TODAY ARE NORMAL. FOLLOW UP IN THE OFFICE. RETURN HERE AS NEEDED.   Menorrhagia  Menorrhagia is when a menstrual period is heavier or longer than normal. HOME CARE  Only take medicine as told by your doctor.   Do not take aspirin 1 week before or during your period. Aspirin can make the bleeding worse.   Lay down for a while if you change your tampon or pad more than once in 2 hours. This may help lessen the bleeding.   Take any iron pills as told by your doctor. Heavy bleeding may cause you to lack iron in your body.   Eat a healthy diet and foods with iron. These foods include leafy green vegetables, meat, liver, eggs, and whole grain breads and cereals.   Eat foods that are high in vitamin C. These include oranges, orange juice, and grapefruits. Vitamin C can help your body take in more iron.   Do not try to lose weight. Wait until the heavy bleeding has stopped and your iron level is normal.  GET HELP RIGHT AWAY IF:  You get a fever.   You have trouble breathing.   You bleed even more heavily than usual and pass blood clots.   You feel dizzy, weak, or pass out (faint).   You need to change your tampon or pad more than once an hour.   You feel sick to your stomach (nauseous), throw up (vomit), or have watery poop (diarrhea).   You have problems from medicine.  MAKE SURE YOU:   Understand these instructions.   Will watch your condition.   Will get help right away if you are not doing well or get worse.  Document Released: 10/28/2007 Document Revised: 09/30/2010 Document Reviewed: 10/28/2007 [facility] Patient Information 2012 [place], [place].

## 2011-03-31 NOTE — Progress Notes (Signed)
Patient state she had a cesarean section on 08-30-10 and had her tubes tied. Breast fed until November and started having bleeding for 14 days on with only a few days not bleeding. Has been heavy and passing large clots. Having abdominal cramping. Was treated for endometritis in August. Feels tired and weak

## 2011-04-01 LAB — GC/CHLAMYDIA PROBE AMP, GENITAL
Chlamydia, DNA Probe: NEGATIVE
GC Probe Amp, Genital: NEGATIVE

## 2011-04-01 NOTE — ED Provider Notes (Signed)
Attestation of Attending Supervision of Advanced Practitioner: Evaluation and management procedures were performed by the PA/NP/CNM/OB Fellow under my supervision/collaboration. Chart reviewed, and agree with management and plan.  Simeon Vera, M.D. 04/01/2011 8:22 AM   

## 2011-04-05 ENCOUNTER — Ambulatory Visit: Payer: Medicaid Other | Admitting: Obstetrics & Gynecology

## 2011-05-13 ENCOUNTER — Ambulatory Visit: Payer: Medicaid Other | Admitting: Obstetrics & Gynecology

## 2011-05-19 ENCOUNTER — Other Ambulatory Visit (HOSPITAL_COMMUNITY)
Admission: RE | Admit: 2011-05-19 | Discharge: 2011-05-19 | Disposition: A | Discharge status: Home / Self Care | Payer: Medicaid Other | Source: Ambulatory Visit | Attending: Obstetrics & Gynecology | Admitting: Obstetrics & Gynecology

## 2011-05-19 ENCOUNTER — Encounter: Payer: Self-pay | Admitting: Obstetrics & Gynecology

## 2011-05-19 ENCOUNTER — Ambulatory Visit (INDEPENDENT_AMBULATORY_CARE_PROVIDER_SITE_OTHER): Payer: Medicaid Other | Admitting: Obstetrics & Gynecology

## 2011-05-19 VITALS — BP 104/74 | HR 74 | Ht 63.0 in | Wt 152.0 lb

## 2011-05-19 DIAGNOSIS — N920 Excessive and frequent menstruation with regular cycle: Secondary | ICD-10-CM

## 2011-05-19 DIAGNOSIS — Z01812 Encounter for preprocedural laboratory examination: Secondary | ICD-10-CM

## 2011-05-19 DIAGNOSIS — N926 Irregular menstruation, unspecified: Secondary | ICD-10-CM | POA: Insufficient documentation

## 2011-05-19 DIAGNOSIS — Z01419 Encounter for gynecological examination (general) (routine) without abnormal findings: Secondary | ICD-10-CM | POA: Insufficient documentation

## 2011-05-19 DIAGNOSIS — N939 Abnormal uterine and vaginal bleeding, unspecified: Secondary | ICD-10-CM

## 2011-05-19 DIAGNOSIS — N390 Urinary tract infection, site not specified: Secondary | ICD-10-CM

## 2011-05-19 DIAGNOSIS — Z124 Encounter for screening for malignant neoplasm of cervix: Secondary | ICD-10-CM

## 2011-05-19 LAB — POCT URINALYSIS DIPSTICK
Bilirubin, UA: NEGATIVE
Ketones, UA: NEGATIVE
Protein, UA: NEGATIVE
pH, UA: 6.5

## 2011-05-19 MED ORDER — SULFAMETHOXAZOLE-TMP DS 800-160 MG PO TABS: 1.0000 | ORAL_TABLET | Freq: Two times a day (BID) | ORAL | Status: AC

## 2011-05-19 MED ORDER — MEDROXYPROGESTERONE ACETATE 10 MG PO TABS: 10.0000 mg | ORAL_TABLET | Freq: Every day | ORAL | Status: DC

## 2011-05-19 NOTE — Patient Instructions (Signed)

## 2011-05-19 NOTE — Progress Notes (Signed)
History:  31 y.o. [id number] here today for discussion of management of AUB.  Please refer to MAU note on 03/31/11 for further details. She had a normal ultrasound and was sent here for evaluation.  She reports having heavy bleeding after that visit helped by the Provera.  No bleeding in 21 days.  She reports feeling tired, weak and lightheaded for a few weeks.  She also reports dysuria for a few days.  Denies any fever, chills, nausea, vomiting or other systemic symptoms.  The following portions of the patient's history were reviewed and updated as appropriate: allergies, current medications, past family history, past medical history, past social history, past surgical history and problem list. Last pap smear was in 02/2010.  Review of Systems:  A comprehensive review of systems was negative.  Objective:  Physical Exam Blood pressure 104/74, pulse 74, height 5\' 3"  (1.6 m), weight 152 lb (68.947 kg), last menstrual period 04/18/2011. Gen: NAD Abd: Soft, nontender and nondistended. Well healed cesarean section scars. Pelvic: Normal appearing external genitalia; normal appearing vaginal mucosa and cervix; cervix is small and deviated to her left. Pap smear done.   Normal discharge.  Small uterus, no palpable masses or adnexal tenderness.  ENDOMETRIAL BIOPSY     The indications for endometrial biopsy were reviewed.   Risks of the biopsy including cramping, bleeding, infection, uterine perforation, inadequate specimen and need for additional procedures  were discussed. The patient states she understands and agrees to undergo procedure today. Consent was signed. Time out was performed. Urine HCG was negative. A sterile speculum was placed in the patient's vagina and the cervix was prepped with Betadine. A single-toothed tenaculum was placed on the anterior lip of the cervix to stabilize it. The 3 mm pipelle was introduced into the endometrial cavity without difficulty to a depth of 10 cm, and a moderate  amount of tissue was obtained and sent to pathology. The instruments were removed from the patient's vagina. Minimal bleeding from the cervix was noted. The patient tolerated the procedure well. Routine post-procedure instructions were given to the patient. The patient will follow up to review the results and for further management.    Labs and Imaging 03/31/11 TRANSABDOMINAL AND TRANSVAGINAL ULTRASOUND OF PELVIS  Findings: Uterus: Measures 10.5 x 4.7 x 5.5 cm. Normal in echogenicity. No focal mass is identified. Endometrium: Measures 5.5 mm in thickness and is homogeneous. Right ovary: Normal in appearance. No ovarian or adnexal mass. Right ovary measures 2.1 x 1.5 x 1.4 cm. Left ovary: Normal appearance. No ovarian or adnexal mass. Measures 1.8 x 1.2 x 1.5 cm. Other findings: No free fluid.  IMPRESSION: Normal study. No evidence of pelvic mass or other significant abnormality.  05/19/11  UA showed 3+ LE, no nitrites.  Cultures sent.  Assessment & Plan:  Discussed management options for AUB including oral Provera, Depo Provera, Mirena IUD, endometrial ablation (Novasure/Hydrothermal Ablation/Thermachoice) or hysterectomy as definitive surgical management.  Discussed risks and benefits of each method. Patient desires hysterectomy.  Will return to discuss robot-assisted hysterectomy with Dr. Marice Potter.  Bleeding precautions reviewed, will use Provera as needed.  Will follow up pap and endometrial biopsy results.  For her possible UTI, she was e-prescribed Bactrim DS bid for 3 days.

## 2011-05-20 LAB — CBC
HCT: 43.7 % (ref 36.0–46.0)
MCH: 31 pg (ref 26.0–34.0)
MCV: 93.6 fL (ref 78.0–100.0)
Platelets: 255 10*3/uL (ref 150–400)
RDW: 14.5 % (ref 11.5–15.5)

## 2011-05-22 LAB — URINE CULTURE

## 2011-06-03 ENCOUNTER — Encounter: Payer: Self-pay | Admitting: Obstetrics & Gynecology

## 2011-06-03 ENCOUNTER — Ambulatory Visit (INDEPENDENT_AMBULATORY_CARE_PROVIDER_SITE_OTHER): Payer: Medicaid Other | Admitting: Obstetrics & Gynecology

## 2011-06-03 VITALS — BP 107/72 | HR 57 | Ht 63.0 in | Wt 154.0 lb

## 2011-06-03 DIAGNOSIS — N926 Irregular menstruation, unspecified: Secondary | ICD-10-CM

## 2011-06-03 DIAGNOSIS — N939 Abnormal uterine and vaginal bleeding, unspecified: Secondary | ICD-10-CM

## 2011-06-03 DIAGNOSIS — N949 Unspecified condition associated with female genital organs and menstrual cycle: Secondary | ICD-10-CM

## 2011-06-03 DIAGNOSIS — N938 Other specified abnormal uterine and vaginal bleeding: Secondary | ICD-10-CM

## 2011-06-03 DIAGNOSIS — R102 Pelvic and perineal pain: Secondary | ICD-10-CM

## 2011-06-03 NOTE — Progress Notes (Signed)
  Subjective:    Patient ID: Lindsey Davenport, female    DOB: 1980-08-23, 31 y.o.   MRN: [id number]  HPI  Ms. Lindsey Davenport is a 31 yo engaged P4 (all Csections) who is wanting surgery for her DUB. She tried a [employer] fast streamer, but says the hormones made her "crazy" (She has bipolar disorder and says her psychiatrist says hormones might not be good for her). She also complains of terrible LBP with her periods and radiation of pain into her anterior thighs. She has dysparunia at times.  Review of Systems Pap smear and embx and u/s normal 4/13. Her Hbg is 14.    Objective:   Physical Exam  NSSA, mobile, NT, no adnexal masses or tenderness      Assessment & Plan:  DUB and dysmenorrhea- She is very interested in a hysterectomy, but I have suggested that she get an endometrial ablation and see if that will resolve her symptoms. If not, she would be a good candidate for a TLH and BSO if endometriosis is seen. She is aware that if she becomes surgically menopausal, she may want ERT.

## 2011-06-16 ENCOUNTER — Encounter (HOSPITAL_COMMUNITY): Payer: Self-pay | Admitting: *Deleted

## 2011-06-25 ENCOUNTER — Encounter (HOSPITAL_COMMUNITY): Payer: Self-pay

## 2011-07-12 ENCOUNTER — Ambulatory Visit (HOSPITAL_COMMUNITY)
Admission: RE | Admit: 2011-07-12 | Discharge: 2011-07-12 | Disposition: A | Discharge status: Home / Self Care | Payer: Medicaid Other | Source: Ambulatory Visit | Attending: Obstetrics & Gynecology | Admitting: Obstetrics & Gynecology

## 2011-07-12 ENCOUNTER — Ambulatory Visit (HOSPITAL_COMMUNITY): Payer: Medicaid Other | Admitting: Anesthesiology

## 2011-07-12 ENCOUNTER — Encounter (HOSPITAL_COMMUNITY): Payer: Self-pay | Admitting: Anesthesiology

## 2011-07-12 ENCOUNTER — Encounter (HOSPITAL_COMMUNITY): Payer: Self-pay | Admitting: *Deleted

## 2011-07-12 ENCOUNTER — Encounter (HOSPITAL_COMMUNITY)
Admission: RE | Disposition: A | Discharge status: Home / Self Care | Payer: Self-pay | Source: Ambulatory Visit | Attending: Obstetrics & Gynecology

## 2011-07-12 DIAGNOSIS — N949 Unspecified condition associated with female genital organs and menstrual cycle: Secondary | ICD-10-CM | POA: Insufficient documentation

## 2011-07-12 DIAGNOSIS — E039 Hypothyroidism, unspecified: Secondary | ICD-10-CM | POA: Insufficient documentation

## 2011-07-12 DIAGNOSIS — N938 Other specified abnormal uterine and vaginal bleeding: Secondary | ICD-10-CM

## 2011-07-12 DIAGNOSIS — IMO0002 Reserved for concepts with insufficient information to code with codable children: Secondary | ICD-10-CM | POA: Insufficient documentation

## 2011-07-12 DIAGNOSIS — N925 Other specified irregular menstruation: Secondary | ICD-10-CM

## 2011-07-12 DIAGNOSIS — N946 Dysmenorrhea, unspecified: Secondary | ICD-10-CM

## 2011-07-12 HISTORY — PX: HYSTEROSCOPY: SHX211

## 2011-07-12 HISTORY — DX: Hypothyroidism, unspecified: E03.9

## 2011-07-12 HISTORY — DX: Gastro-esophageal reflux disease without esophagitis: K21.9

## 2011-07-12 LAB — CBC
HCT: 39.5 % (ref 36.0–46.0)
Hemoglobin: 13.3 g/dL (ref 12.0–15.0)
MCH: 30.7 pg (ref 26.0–34.0)
MCHC: 33.7 g/dL (ref 30.0–36.0)
MCV: 91.2 fL (ref 78.0–100.0)
RDW: 13.5 % (ref 11.5–15.5)

## 2011-07-12 SURGERY — ABLATION, ENDOMETRIUM, HYSTEROSCOPIC
Anesthesia: General | Site: Vagina | Wound class: Clean Contaminated

## 2011-07-12 MED ORDER — BUPIVACAINE HCL 0.5 % IJ SOLN
INTRAMUSCULAR | Status: DC | PRN
  Administered 2011-07-12: 20 mL

## 2011-07-12 MED ORDER — ONDANSETRON HCL 4 MG/2ML IJ SOLN
INTRAMUSCULAR | Status: DC | PRN
  Administered 2011-07-12: 4 mg via INTRAVENOUS

## 2011-07-12 MED ORDER — ONDANSETRON HCL 4 MG/2ML IJ SOLN
INTRAMUSCULAR | Status: AC
  Filled 2011-07-12: qty 2

## 2011-07-12 MED ORDER — IBUPROFEN 600 MG PO TABS: 600.0000 mg | ORAL_TABLET | Freq: Four times a day (QID) | ORAL | Status: AC | PRN

## 2011-07-12 MED ORDER — LIDOCAINE HCL (CARDIAC) 20 MG/ML IV SOLN
INTRAVENOUS | Status: DC | PRN
  Administered 2011-07-12: 90 mg via INTRAVENOUS

## 2011-07-12 MED ORDER — SODIUM CHLORIDE 0.9 % IR SOLN
Status: DC | PRN
  Administered 2011-07-12: 1000 mL

## 2011-07-12 MED ORDER — FENTANYL CITRATE 0.05 MG/ML IJ SOLN
INTRAMUSCULAR | Status: AC
  Filled 2011-07-12: qty 2

## 2011-07-12 MED ORDER — KETOROLAC TROMETHAMINE 30 MG/ML IJ SOLN: 15.0000 mg | Freq: Once | INTRAMUSCULAR | Status: DC | PRN

## 2011-07-12 MED ORDER — KETOROLAC TROMETHAMINE 30 MG/ML IJ SOLN
INTRAMUSCULAR | Status: DC | PRN
  Administered 2011-07-12: 30 mg via INTRAVENOUS

## 2011-07-12 MED ORDER — SILVER NITRATE-POT NITRATE 75-25 % EX MISC
CUTANEOUS | Status: AC
  Filled 2011-07-12: qty 1

## 2011-07-12 MED ORDER — GLYCOPYRROLATE 0.2 MG/ML IJ SOLN
INTRAMUSCULAR | Status: AC
  Filled 2011-07-12: qty 1

## 2011-07-12 MED ORDER — LIDOCAINE HCL (CARDIAC) 20 MG/ML IV SOLN
INTRAVENOUS | Status: AC
  Filled 2011-07-12: qty 5

## 2011-07-12 MED ORDER — KETOROLAC TROMETHAMINE 30 MG/ML IJ SOLN
INTRAMUSCULAR | Status: AC
  Filled 2011-07-12: qty 1

## 2011-07-12 MED ORDER — PROPOFOL 10 MG/ML IV EMUL
INTRAVENOUS | Status: DC | PRN
  Administered 2011-07-12: 200 mg via INTRAVENOUS

## 2011-07-12 MED ORDER — CEFAZOLIN SODIUM 1-5 GM-% IV SOLN: 1.0000 g | INTRAVENOUS | Status: DC

## 2011-07-12 MED ORDER — FENTANYL CITRATE 0.05 MG/ML IJ SOLN
INTRAMUSCULAR | Status: DC | PRN
  Administered 2011-07-12: 100 ug via INTRAVENOUS

## 2011-07-12 MED ORDER — DEXAMETHASONE SODIUM PHOSPHATE 4 MG/ML IJ SOLN
INTRAMUSCULAR | Status: DC | PRN
  Administered 2011-07-12: 10 mg via INTRAVENOUS

## 2011-07-12 MED ORDER — OXYCODONE-ACETAMINOPHEN 5-325 MG PO TABS: 1.0000 | ORAL_TABLET | ORAL | Status: AC | PRN

## 2011-07-12 MED ORDER — BUPIVACAINE HCL (PF) 0.5 % IJ SOLN
INTRAMUSCULAR | Status: AC
  Filled 2011-07-12: qty 30

## 2011-07-12 MED ORDER — GLYCOPYRROLATE 0.2 MG/ML IJ SOLN
INTRAMUSCULAR | Status: DC | PRN
  Administered 2011-07-12: 0.2 mg via INTRAVENOUS

## 2011-07-12 MED ORDER — CEFAZOLIN SODIUM 1-5 GM-% IV SOLN
INTRAVENOUS | Status: AC
  Filled 2011-07-12: qty 50

## 2011-07-12 MED ORDER — PROPOFOL 10 MG/ML IV EMUL
INTRAVENOUS | Status: AC
  Filled 2011-07-12: qty 20

## 2011-07-12 MED ORDER — FENTANYL CITRATE 0.05 MG/ML IJ SOLN
25.0000 ug | INTRAMUSCULAR | Status: DC | PRN
  Administered 2011-07-12: 25 ug via INTRAVENOUS
  Administered 2011-07-12: 50 ug via INTRAVENOUS

## 2011-07-12 MED ORDER — DEXAMETHASONE SODIUM PHOSPHATE 10 MG/ML IJ SOLN
INTRAMUSCULAR | Status: AC
  Filled 2011-07-12: qty 1

## 2011-07-12 MED ORDER — MIDAZOLAM HCL 5 MG/5ML IJ SOLN
INTRAMUSCULAR | Status: DC | PRN
  Administered 2011-07-12: 2 mg via INTRAVENOUS

## 2011-07-12 MED ORDER — SILVER NITRATE-POT NITRATE 75-25 % EX MISC
CUTANEOUS | Status: DC | PRN
  Administered 2011-07-12: 1

## 2011-07-12 MED ORDER — MIDAZOLAM HCL 2 MG/2ML IJ SOLN
INTRAMUSCULAR | Status: AC
  Filled 2011-07-12: qty 2

## 2011-07-12 MED ORDER — LACTATED RINGERS IV SOLN
INTRAVENOUS | Status: DC
  Administered 2011-07-12 (×2): via INTRAVENOUS
  Administered 2011-07-12: 125 mL/h via INTRAVENOUS

## 2011-07-12 SURGICAL SUPPLY — 15 items
CANISTER SUCTION 2500CC (MISCELLANEOUS) ×2 IMPLANT
CATH ROBINSON RED A/P 16FR (CATHETERS) ×2 IMPLANT
CLOTH BEACON ORANGE TIMEOUT ST (SAFETY) ×2 IMPLANT
CONTAINER PREFILL 10% NBF 60ML (FORM) ×4 IMPLANT
DILATOR CANAL MILEX (MISCELLANEOUS) IMPLANT
DRAPE HYSTEROSCOPY (DRAPE) ×2 IMPLANT
ELECT REM PT RETURN 9FT ADLT (ELECTROSURGICAL)
ELECTRODE REM PT RTRN 9FT ADLT (ELECTROSURGICAL) IMPLANT
GLOVE BIO SURGEON STRL SZ 6.5 (GLOVE) ×4 IMPLANT
GOWN PREVENTION PLUS LG XLONG (DISPOSABLE) ×4 IMPLANT
GOWN STRL REIN XL XLG (GOWN DISPOSABLE) ×2 IMPLANT
PACK VAGINAL MINOR WOMEN LF (CUSTOM PROCEDURE TRAY) ×2 IMPLANT
SET GENESYS HTA PROCERVA (MISCELLANEOUS) ×2 IMPLANT
TOWEL OR 17X24 6PK STRL BLUE (TOWEL DISPOSABLE) ×4 IMPLANT
WATER STERILE IRR 1000ML POUR (IV SOLUTION) ×2 IMPLANT

## 2011-07-12 NOTE — Anesthesia Postprocedure Evaluation (Signed)
  Anesthesia Post-op Note  Patient: Lindsey Davenport  Procedure(s) Performed: Procedure(s) (LRB): HYSTEROSCOPY WITH HYDROTHERMAL ABLATION (N/A)  Patient is awake and responsive. Pain and nausea are reasonably well controlled. Vital signs are stable and clinically acceptable. Oxygen saturation is clinically acceptable. There are no apparent anesthetic complications at this time. Patient is ready for discharge.

## 2011-07-12 NOTE — H&P (Signed)
  HPI  61 engaged W G4P4 who had her 4th c/s 10 months ago who has had a long h/o DUB. Work up has included a normal ultrasound, cervical cultures, pap smear, EMBX. She tried the provera to no avail. Her periods last about 7-14 days and are very heavy with clots and severe cramping. She has occasional dysparunia, depending on her cycle relation to the coitus. She has cramps that radiate to the anterior thighs at time.  PMH- Bipolar disorder, depression, anxiety, hypothyroidism (Dr. Elyn Peers at [facility])  [facility]- 4 cesareans, BTL, tonsils, wisdom teeth extraction  NKDA  SH- quit smoking 1 month ago, social etoh  FH- breast cancer in her mother, pGM, mGM, colon cancer- mGF, unknown gyn cancer  Meds- zyrtec, synthroid, Vyvanse, prilosec, trileptal, topamax, sonate,abilify, xanax  Ros- monogamous for 3 years, homemaker  PE- [facility] NAD  Heart- rrr Lungs- CTAB Abd- benign  A/P. DUB- she is interested in a hysterectomy, but with a normal u/s, I think her symptoms may be relieved by a less invasive procedure. I have discussed the risks of surgery and she understands them.

## 2011-07-12 NOTE — Anesthesia Procedure Notes (Signed)
Procedure Name: LMA Insertion Date/Time: 07/12/2011 12:35 PM Performed by: Virginie Josten, Jannet Askew Pre-anesthesia Checklist: Patient identified, Patient being monitored, Emergency Drugs available, Timeout performed and Suction available Patient Re-evaluated:Patient Re-evaluated prior to inductionOxygen Delivery Method: Circle system utilized Preoxygenation: Pre-oxygenation with 100% oxygen Intubation Type: IV induction Ventilation: Mask ventilation without difficulty LMA: LMA inserted LMA Size: 4.0 Number of attempts: 1 Placement Confirmation: positive ETCO2 and breath sounds checked- equal and bilateral Tube secured with: Tape (taped at upper lip) Dental Injury: Teeth and Oropharynx as per pre-operative assessment

## 2011-07-12 NOTE — Discharge Instructions (Signed)
Hysteroscopy Hysteroscopy is a procedure used for looking inside the womb (uterus). It may be done for many different reasons, including:  To evaluate abnormal bleeding, fibroid (benign, noncancerous) tumors, polyps, scar tissue (adhesions), and possibly cancer of the uterus.   To look for lumps (tumors) and other uterine growths.   To look for causes of why a woman cannot get pregnant (infertility), causes of recurrent loss of pregnancy (miscarriages), or a lost intrauterine device (IUD).   To perform a sterilization by blocking the fallopian tubes from inside the uterus.  A hysteroscopy should be done right after a menstrual period to be sure you are not pregnant. LET YOUR CAREGIVER KNOW ABOUT:   Allergies.   Medicines taken, including herbs, eyedrops, over-the-counter medicines, and creams.   Use of steroids (by mouth or creams).   Previous problems with anesthetics or numbing medicines.   History of bleeding or blood problems.   History of blood clots.   Possibility of pregnancy, if this applies.   Previous surgery.   Other health problems.  RISKS AND COMPLICATIONS   Putting a hole in the uterus.   Excessive bleeding.   Infection.   Damage to the cervix.   Injury to other organs.   Allergic reaction to medicines.   Too much fluid used in the uterus for the procedure.  BEFORE THE PROCEDURE   Do not take aspirin or blood thinners for a week before the procedure, or as directed. It can cause bleeding.   Arrive at least 60 minutes before the procedure or as directed to read and sign the necessary forms.   Arrange for someone to take you home after the procedure.   If you smoke, do not smoke for 2 weeks before the procedure.  PROCEDURE   Your caregiver may give you medicine to relax you. He or she may also give you a medicine that numbs the area around the cervix (local anesthetic) or a medicine that makes you sleep (general anesthesia).   Sometimes, a  medicine is placed in the cervix the day before the procedure. This medicine makes the cervix have a larger opening (dilate). This makes it easier for the instrument to be inserted into the uterus.   A small instrument (hysteroscope) is inserted through the vagina into the uterus. This instrument is similar to a pencil-sized telescope with a light.   During the procedure, air or a liquid is put into the uterus, which allows the surgeon to see better.   Sometimes, tissue is gently scraped from inside the uterus. These tissue samples are sent to a specialist who looks at tissue samples (pathologist). The pathologist will give a report to your caregiver. This will help your caregiver decide if further treatment is necessary. The report will also help your caregiver decide on the best treatment if the test comes back abnormal.  AFTER THE PROCEDURE   If you had a general anesthetic, you may be groggy for a couple hours after the procedure.   If you had a local anesthetic, you will be advised to rest at the surgical center or caregiver's office until you are stable and feel ready to go home.   You may have some cramping for a couple days.   You may have bleeding, which varies from light spotting for a few days to menstrual-like bleeding for up to 3 to 7 days. This is normal.   Have someone take you home.  FINDING OUT THE RESULTS OF YOUR TEST Not all test results   are available during your visit. If your test results are not back during the visit, make an appointment with your caregiver to find out the results. Do not assume everything is normal if you have not heard from your caregiver or the medical facility. It is important for you to follow up on all of your test results. HOME CARE INSTRUCTIONS   Do not drive for 24 hours or as instructed.   Only take over-the-counter or prescription medicines for pain, discomfort, or fever as directed by your caregiver.   Do not take aspirin. It can cause or  aggravate bleeding.   Do not drive or drink alcohol while taking pain medicine.   You may resume your usual diet.   Do not use tampons, douche, or have sexual intercourse for 2 weeks, or as advised by your caregiver.   Rest and sleep for the first 24 to 48 hours.   Take your temperature twice a day for 4 to 5 days. Write it down. Give these temperatures to your caregiver if they are abnormal (above 98.6 F or 37.0 C).   Take medicines your caregiver has ordered as directed.   Follow your caregiver's advice regarding diet, exercise, lifting, driving, and general activities.   Take showers instead of baths for 2 weeks, or as recommended by your caregiver.   If you develop constipation:   Take a mild laxative with the advice of your caregiver.   Eat bran foods.   Drink enough water and fluids to keep your urine clear or pale yellow.   Try to have someone with you or available to you for the first 24 to 48 hours, especially if you had a general anesthetic.   Make sure you and your family understand everything about your operation and recovery.   Follow your caregiver's advice regarding follow-up appointments and Pap smears.  SEEK MEDICAL CARE IF:   You feel dizzy or lightheaded.   You feel sick to your stomach (nauseous).   You develop abnormal vaginal discharge.   You develop a rash.   You have an abnormal reaction or allergy to your medicine.   You need stronger pain medicine.  SEEK IMMEDIATE MEDICAL CARE IF:   Bleeding is heavier than a normal menstrual period or you have blood clots.   You have an oral temperature above 102 F (38.9 C), not controlled by medicine.   You have increasing cramps or pains not relieved with medicine.   You develop belly (abdominal) pain that does not seem to be related to the same area of earlier cramping and pain.   You pass out.   You develop pain in the tops of your shoulders (shoulder strap areas).   You develop shortness of  breath.  MAKE SURE YOU:   Understand these instructions.   Will watch your condition.   Will get help right away if you are not doing well or get worse.  Document Released: 04/26/2000 Document Revised: 01/07/2011 Document Reviewed: 08/19/2008 [employer] Patient Information 2012 [employer]. 

## 2011-07-12 NOTE — Transfer of Care (Signed)
Immediate Anesthesia Transfer of Care Note  Patient: Lindsey Davenport  Procedure(s) Performed: Procedure(s) (LRB): HYSTEROSCOPY WITH HYDROTHERMAL ABLATION (N/A)  Patient Location: PACU  Anesthesia Type: General  Level of Consciousness: awake and alert   Airway & Oxygen Therapy: Patient Spontanous Breathing and Patient connected to nasal cannula oxygen  Post-op Assessment: Report given to PACU RN and Post -op Vital signs reviewed and stable  Post vital signs: Reviewed and stable  Complications: No apparent anesthesia complications

## 2011-07-12 NOTE — Op Note (Signed)
07/12/2011  1:58 PM  PATIENT:  Lindsey Davenport  31 y.o. female  PRE-OPERATIVE DIAGNOSIS:  DUB  POST-OPERATIVE DIAGNOSIS:  DUB  PROCEDURE:  Procedure(s) (LRB): HYSTEROSCOPY WITH HYDROTHERMAL ABLATION (N/A)  SURGEON:  Surgeon(s) and Role:    * Allie Bossier, MD - Primary  PHYSICIAN ASSISTANT:   ASSISTANTS: none   ANESTHESIA:   general  EBL:  Total I/O In: 1200 [I.V.:1200] Out: 0   BLOOD ADMINISTERED:none  DRAINS: none   LOCAL MEDICATIONS USED:  MARCAINE     SPECIMEN:  No Specimen  DISPOSITION OF SPECIMEN:  N/A  COUNTS:  YES  TOURNIQUET:  * No tourniquets in log *  DICTATION: .Dragon Dictation  PLAN OF CARE: Discharge to home after PACU  PATIENT DISPOSITION:  PACU - hemodynamically stable.   Delay start of Pharmacological VTE agent (>24hrs) due to surgical blood loss or risk of bleeding: not applicable  The risks, benefits, and alternatives of surgery were explained, understood, and accepted. Consents were signed. All questions were answered. She was taken to the operating room, and general anesthesia was applied without complication. She was placed in the dorsal lithotomy position. Her vagina was prepped and draped in the usual sterile fashion. Her bladder was emptied with a Robinson catheter. A bimanual exam revealed nonenlarged adnexa and a mobile normal size and shape uterus. The speculum was placed. The anterior lip of the cervix was grasped with a single-tooth tenaculum. A paracervical block was done with 20 mL of 0.5% Marcaine. Her uterus sounded to 10 cm. Her cervix was gently and sequentially dilated to a 23 French Hegar dilator. Hysteroscopy showed a normal-appearing uterine interior. The HTA device was appropriately placed and the procedure proceeded without complications. After the appropriate cool down time, the HTA device was removed. Hemostasis was noted.she was extubated and taken into the recovery room in stable condition. She tolerated the procedure  well.

## 2011-07-12 NOTE — Anesthesia Preprocedure Evaluation (Signed)
Anesthesia Evaluation  Patient identified by MRN, date of birth, ID band Patient awake    Reviewed: Allergy & Precautions, H&P , NPO status , Patient's Chart, lab work & pertinent test results, reviewed documented beta blocker date and time   History of Anesthesia Complications Negative for: history of anesthetic complications  Airway Mallampati: II TM Distance: >3 FB Neck ROM: full    Dental  (+) Teeth Intact   Pulmonary former smoker (quit one month ago) breath sounds clear to auscultation  Pulmonary exam normal       Cardiovascular Exercise Tolerance: Good negative cardio ROS  Rhythm:regular Rate:Normal     Neuro/Psych  Headaches, PSYCHIATRIC DISORDERS (bipolar d/o, anxiety, depression on 6 meds)    GI/Hepatic GERD-  Medicated,(+)     substance abuse (cocaine - quit 2011)  cocaine use,   Endo/Other  Hypothyroidism (on synthroid)   Renal/GU negative Renal ROS  Female GU complaint     Musculoskeletal   Abdominal   Peds  Hematology negative hematology ROS (+)   Anesthesia Other Findings   Reproductive/Obstetrics negative OB ROS                           Anesthesia Physical Anesthesia Plan  ASA: II  Anesthesia Plan: General LMA   Post-op Pain Management:    Induction:   Airway Management Planned:   Additional Equipment:   Intra-op Plan:   Post-operative Plan:   Informed Consent: I have reviewed the patients History and Physical, chart, labs and discussed the procedure including the risks, benefits and alternatives for the proposed anesthesia with the patient or authorized representative who has indicated his/her understanding and acceptance.   Dental Advisory Given  Plan Discussed with: CRNA and Surgeon  Anesthesia Plan Comments:         Anesthesia Quick Evaluation

## 2011-07-13 ENCOUNTER — Encounter (HOSPITAL_COMMUNITY): Payer: Self-pay | Admitting: Obstetrics & Gynecology

## 2011-08-23 ENCOUNTER — Ambulatory Visit (INDEPENDENT_AMBULATORY_CARE_PROVIDER_SITE_OTHER): Payer: Medicaid Other | Admitting: Obstetrics & Gynecology

## 2011-08-23 VITALS — BP 129/82 | HR 76 | Ht 62.0 in | Wt 138.0 lb

## 2011-08-23 DIAGNOSIS — Z9889 Other specified postprocedural states: Secondary | ICD-10-CM

## 2011-08-23 MED ORDER — DICLOFENAC POTASSIUM 50 MG PO TABS: 50.0000 mg | ORAL_TABLET | Freq: Three times a day (TID) | ORAL | Status: DC

## 2011-08-23 NOTE — Progress Notes (Signed)
  Subjective:    Patient ID: Lindsey Davenport, female    DOB: 31-Dec-1980, 31 y.o.   MRN: [id number]  HPI  She is now 6 weeks post op status post HTA. She has not had a period (VB) this month but she did have a week of her usual back pain/leg pain.   Review of Systems     Objective:   Physical Exam        Assessment & Plan:  DUB- now status post HTA Dysmenorrhea- she may have endometriiosis, but with her bipolar d/o, she is not willing to try depo Lupron (supressing ovarian function). She is willing to try Cataflam prn.

## 2011-08-23 NOTE — Progress Notes (Signed)
Post op

## 2011-11-12 ENCOUNTER — Encounter: Payer: Self-pay | Admitting: Obstetrics and Gynecology

## 2011-11-12 ENCOUNTER — Ambulatory Visit (INDEPENDENT_AMBULATORY_CARE_PROVIDER_SITE_OTHER): Payer: Medicaid Other | Admitting: Obstetrics and Gynecology

## 2011-11-12 VITALS — BP 121/74 | HR 63 | Ht 63.0 in | Wt 129.0 lb

## 2011-11-12 DIAGNOSIS — N764 Abscess of vulva: Secondary | ICD-10-CM

## 2011-11-12 NOTE — Progress Notes (Signed)
Patient ID: Lindsey Davenport, female   DOB: [birth year], 31 y.o.   MRN: [id number] Patient presents today for evaluation of small bump on her upper left labia majora that she noticed a week ago. It appears to be getting larger and is slightly tender. Patient denies shaving this area.  Vulva: normal external genitalia with a 1 cm abscess on upper left labia majora. Tender to touch. Not ready to be drained yet  A/P 31 yo [id number] with vulva abscess - Patient advised to apply heat to the area as often as possible to help develop the abscess and allow for drainage - RTC in 2-3 weeks if not improvement is seen for possible I&D in office

## 2012-02-02 DIAGNOSIS — F1911 Other psychoactive substance abuse, in remission: Secondary | ICD-10-CM

## 2012-02-02 HISTORY — DX: Other psychoactive substance abuse, in remission: F19.11

## 2012-04-05 ENCOUNTER — Ambulatory Visit (INDEPENDENT_AMBULATORY_CARE_PROVIDER_SITE_OTHER): Payer: BC Managed Care – PPO | Admitting: Psychology

## 2012-04-05 DIAGNOSIS — F319 Bipolar disorder, unspecified: Secondary | ICD-10-CM

## 2012-04-26 ENCOUNTER — Ambulatory Visit (INDEPENDENT_AMBULATORY_CARE_PROVIDER_SITE_OTHER): Payer: BC Managed Care – PPO | Admitting: Psychology

## 2012-04-26 DIAGNOSIS — F319 Bipolar disorder, unspecified: Secondary | ICD-10-CM

## 2012-05-03 ENCOUNTER — Ambulatory Visit: Payer: BC Managed Care – PPO | Admitting: Psychology

## 2012-05-17 ENCOUNTER — Ambulatory Visit (INDEPENDENT_AMBULATORY_CARE_PROVIDER_SITE_OTHER): Payer: BC Managed Care – PPO | Admitting: Psychology

## 2012-05-17 DIAGNOSIS — F319 Bipolar disorder, unspecified: Secondary | ICD-10-CM

## 2012-05-18 ENCOUNTER — Encounter (HOSPITAL_COMMUNITY): Payer: Self-pay | Admitting: Emergency Medicine

## 2012-05-18 ENCOUNTER — Encounter (HOSPITAL_COMMUNITY): Payer: Self-pay

## 2012-05-18 ENCOUNTER — Inpatient Hospital Stay (HOSPITAL_COMMUNITY)
Admission: AD | Admit: 2012-05-18 | Discharge: 2012-05-22 | DRG: 430 | Disposition: A | Discharge status: Home / Self Care | Payer: BC Managed Care – PPO | Source: Intra-hospital | Attending: Psychiatry | Admitting: Psychiatry

## 2012-05-18 ENCOUNTER — Emergency Department (HOSPITAL_COMMUNITY)
Admission: EM | Admit: 2012-05-18 | Discharge: 2012-05-18 | Disposition: A | Discharge status: Other Acute Inpatient Hospital | Payer: BC Managed Care – PPO | Attending: Emergency Medicine | Admitting: Emergency Medicine

## 2012-05-18 DIAGNOSIS — F1910 Other psychoactive substance abuse, uncomplicated: Secondary | ICD-10-CM | POA: Insufficient documentation

## 2012-05-18 DIAGNOSIS — F411 Generalized anxiety disorder: Secondary | ICD-10-CM | POA: Diagnosis present

## 2012-05-18 DIAGNOSIS — R51 Headache: Secondary | ICD-10-CM | POA: Insufficient documentation

## 2012-05-18 DIAGNOSIS — Z8669 Personal history of other diseases of the nervous system and sense organs: Secondary | ICD-10-CM | POA: Insufficient documentation

## 2012-05-18 DIAGNOSIS — F3130 Bipolar disorder, current episode depressed, mild or moderate severity, unspecified: Principal | ICD-10-CM | POA: Diagnosis present

## 2012-05-18 DIAGNOSIS — F314 Bipolar disorder, current episode depressed, severe, without psychotic features: Secondary | ICD-10-CM

## 2012-05-18 DIAGNOSIS — F410 Panic disorder [episodic paroxysmal anxiety] without agoraphobia: Secondary | ICD-10-CM | POA: Insufficient documentation

## 2012-05-18 DIAGNOSIS — F17200 Nicotine dependence, unspecified, uncomplicated: Secondary | ICD-10-CM | POA: Insufficient documentation

## 2012-05-18 DIAGNOSIS — R52 Pain, unspecified: Secondary | ICD-10-CM | POA: Insufficient documentation

## 2012-05-18 DIAGNOSIS — F319 Bipolar disorder, unspecified: Secondary | ICD-10-CM | POA: Insufficient documentation

## 2012-05-18 DIAGNOSIS — Z79899 Other long term (current) drug therapy: Secondary | ICD-10-CM

## 2012-05-18 DIAGNOSIS — Z8719 Personal history of other diseases of the digestive system: Secondary | ICD-10-CM | POA: Insufficient documentation

## 2012-05-18 DIAGNOSIS — Z862 Personal history of diseases of the blood and blood-forming organs and certain disorders involving the immune mechanism: Secondary | ICD-10-CM | POA: Insufficient documentation

## 2012-05-18 DIAGNOSIS — R45851 Suicidal ideations: Secondary | ICD-10-CM

## 2012-05-18 DIAGNOSIS — Z8639 Personal history of other endocrine, nutritional and metabolic disease: Secondary | ICD-10-CM | POA: Insufficient documentation

## 2012-05-18 LAB — COMPREHENSIVE METABOLIC PANEL
AST: 12 U/L (ref 0–37)
Albumin: 3.7 g/dL (ref 3.5–5.2)
BUN: 14 mg/dL (ref 6–23)
CO2: 28 mEq/L (ref 19–32)
Calcium: 9.4 mg/dL (ref 8.4–10.5)
Chloride: 107 mEq/L (ref 96–112)
Creatinine, Ser: 0.82 mg/dL (ref 0.50–1.10)
GFR calc non Af Amer: >90 mL/min (ref >90)
Total Bilirubin: 0.2 mg/dL — ABNORMAL LOW (ref 0.3–1.2)

## 2012-05-18 LAB — CBC
HCT: 42 % (ref 36.0–46.0)
MCH: 31.8 pg (ref 26.0–34.0)
MCV: 89.7 fL (ref 78.0–100.0)
Platelets: 201 10*3/uL (ref 150–400)
RDW: 12 % (ref 11.5–15.5)
WBC: 7.1 10*3/uL (ref 4.0–10.5)

## 2012-05-18 LAB — RAPID URINE DRUG SCREEN, HOSP PERFORMED: Opiates: NOT DETECTED

## 2012-05-18 LAB — SALICYLATE LEVEL: Salicylate Lvl: <2 mg/dL — ABNORMAL LOW (ref 2.8–20.0)

## 2012-05-18 LAB — T3, FREE: T3, Free: 2.3 pg/mL (ref 2.3–4.2)

## 2012-05-18 LAB — POCT PREGNANCY, URINE: Preg Test, Ur: NEGATIVE

## 2012-05-18 LAB — T4, FREE: Free T4: 0.86 ng/dL (ref 0.80–1.80)

## 2012-05-18 LAB — ETHANOL: Alcohol, Ethyl (B): <11 mg/dL (ref 0–11)

## 2012-05-18 MED ORDER — TOPIRAMATE 100 MG PO TABS
100.0000 mg | ORAL_TABLET | Freq: Every day | ORAL | Status: DC
  Administered 2012-05-18 – 2012-05-21 (×4): 100 mg via ORAL
  Filled 2012-05-18 (×4): qty 1
  Filled 2012-05-18: qty 3
  Filled 2012-05-18 (×3): qty 1

## 2012-05-18 MED ORDER — ALUM & MAG HYDROXIDE-SIMETH 200-200-20 MG/5ML PO SUSP: 30.0000 mL | ORAL | Status: DC | PRN

## 2012-05-18 MED ORDER — ALPRAZOLAM 1 MG PO TABS
1.0000 mg | ORAL_TABLET | Freq: Once | ORAL | Status: AC
  Administered 2012-05-18: 1 mg via ORAL
  Filled 2012-05-18: qty 1

## 2012-05-18 MED ORDER — LEVOTHYROXINE SODIUM 50 MCG PO TABS
50.0000 ug | ORAL_TABLET | Freq: Every day | ORAL | Status: DC
  Administered 2012-05-19 – 2012-05-22 (×4): 50 ug via ORAL
  Filled 2012-05-18 (×5): qty 1
  Filled 2012-05-18: qty 3
  Filled 2012-05-18: qty 1

## 2012-05-18 MED ORDER — ACETAMINOPHEN 325 MG PO TABS: 650.0000 mg | ORAL_TABLET | Freq: Four times a day (QID) | ORAL | Status: DC | PRN

## 2012-05-18 MED ORDER — MAGNESIUM HYDROXIDE 400 MG/5ML PO SUSP: 30.0000 mL | Freq: Every day | ORAL | Status: DC | PRN

## 2012-05-18 NOTE — [facility] Counselor (Signed)
Telepsych in progress. 

## 2012-05-18 NOTE — ED Notes (Signed)
No acute distress noted.

## 2012-05-18 NOTE — ED Notes (Signed)
Pt is hard of hearing you must face her in order for her to hear you.

## 2012-05-18 NOTE — ED Notes (Signed)
Security in to wand patient and search belongings.

## 2012-05-18 NOTE — ED Notes (Signed)
Husband states that pt has been spiraling down with medication not helping the patient cope day to day. States that she has had manic phases was seen by her therapist and was recommended to come to the ER for inpatient counseling. States that she has had body aches, headache,and all she wants to do is sleep.

## 2012-05-18 NOTE — Progress Notes (Signed)
Pt is 32 year old female admitted with depression   She reports no suicidal ideation but very depressed and hopelesss  She reports sleeping or laying in bed most of the time  Reports no energy poor appetite and weight loss of 10 lbs    Pt reports her doctor has been doing medication changes and none of them has worked  She reports she is bipolar and had a manic episode about 2 weeks ago   She is calm and cooperative during the assessment   Her affect is depressed and sad   Pt was oriented to the unit  Offered nourishment  Medications administered and effectiveness monitored  Q 15 min checks  Pt safe at present

## 2012-05-18 NOTE — ED Provider Notes (Signed)
History     CSN: [id number]  Arrival date & time 05/18/12  1309   First MD Initiated Contact with Patient 05/18/12 1326      Chief Complaint  Patient presents with  . Medical Clearance    (Consider location/radiation/quality/duration/timing/severity/associated sxs/prior treatment) HPI Comments: Patient presents emergency department with chief complaints of medical clearance. She states that she has bipolar, and that she has been "spiraling downhill." She states that she has had her medications switched several times, but nothing seems to be working. She has had increasing manic phases, and was referred to the emergency department by her therapist. Patient would like to be evaluated for inpatient treatment. She states that she has generalized body aches, mild headache. She says that she is depressed, and just wants to sleep. He denies any other symptoms. Patient denies blurred vision, new hearing loss, sore throat, chest pain, shortness of breath, nausea, vomiting, diarrhea, constipation, dysuria, peripheral edema, back pain, numbness or tingling of the extremities.   The history is provided by the patient. No language interpreter was used.    Past Medical History  Diagnosis Date  . Thyroid disorder   . Bipolar 1 disorder   . Drug abuse     history of, not since 2 years ago  . Depression   . Anxiety   . Panic attack   . Herpes   . Headache     migraines  . GERD (gastroesophageal reflux disease)   . Hypothyroidism   . Hard of hearing     Past Surgical History  Procedure Laterality Date  . Cesarean section  08/30/2010 x 4    Procedure: CESAREAN SECTION;  Surgeon: Lazaro Arms, MD;  Location: [facility];  Service: Gynecology;  Laterality: N/A;  . Tonsillectomy  as child  . Tubal ligation    . Wisdom tooth extraction    . Hysteroscopy  07/12/2011    Procedure: HYSTEROSCOPY WITH HYDROTHERMAL ABLATION;  Surgeon: Allie Bossier, MD;  Location: [facility];  Service: Gynecology;  Laterality:  N/A;    Family History  Problem Relation Age of Onset  . Cancer Paternal Grandmother     breast  . Cancer Maternal Grandmother     breast  . Cancer Maternal Grandfather     colon    History  Substance Use Topics  . Smoking status: Current Every Day Smoker -- 0.50 packs/day for 18 years    Types: Cigarettes  . Smokeless tobacco: Never Used  . Alcohol Use: Yes     Comment: socially    OB History   Grav Para Term Preterm Abortions TAB SAB Ect Mult Living   8 4 4  0 3  2 1  4       Review of Systems  All other systems reviewed and are negative.    Allergies  Review of patient's allergies indicates no known allergies.  Home Medications   Current Outpatient Rx  Name  Route  Sig  Dispense  Refill  . ALPRAZolam (XANAX) 1 MG tablet   Oral   Take 1 mg by mouth 3 (three) times daily.         Marland Kitchen amoxicillin (AMOXIL) 500 MG tablet   Oral   Take 500 mg by mouth 2 (two) times daily.         Marland Kitchen levothyroxine (SYNTHROID, LEVOTHROID) 50 MCG tablet   Oral   Take 50 mcg by mouth daily.         Marland Kitchen lisdexamfetamine (VYVANSE) 60 MG capsule  Oral   Take 60 mg by mouth every morning.         . topiramate (TOPAMAX) 100 MG tablet   Oral   Take 100 mg by mouth at bedtime.            BP 112/67  Pulse 63  Temp(Src) 98.4 F (36.9 C) (Oral)  Resp 14  SpO2 100%  Physical Exam  Nursing note and vitals reviewed. Constitutional: She is oriented to person, place, and time. She appears well-developed and well-nourished.  HENT:  Head: Normocephalic and atraumatic.  Eyes: Conjunctivae and EOM are normal. Pupils are equal, round, and reactive to light.  Neck: Normal range of motion. Neck supple.  Cardiovascular: Normal rate and regular rhythm.  Exam reveals no gallop and no friction rub.   No murmur heard. Pulmonary/Chest: Effort normal and breath sounds normal. No respiratory distress. She has no wheezes. She has no rales. She exhibits no tenderness.  Abdominal: Soft.  Bowel sounds are normal. She exhibits no distension and no mass. There is no tenderness. There is no rebound and no guarding.  Musculoskeletal: Normal range of motion. She exhibits no edema and no tenderness.  Neurological: She is alert and oriented to person, place, and time.  Skin: Skin is warm and dry.  Psychiatric: She has a normal mood and affect. Her behavior is normal. Judgment and thought content normal.    ED Course  Procedures (including critical care time)  Labs Reviewed  COMPREHENSIVE METABOLIC PANEL - Abnormal; Notable for the following:    Total Bilirubin 0.2 (*)    All other components within normal limits  SALICYLATE LEVEL - Abnormal; Notable for the following:    Salicylate Lvl <2.0 (*)    All other components within normal limits  URINE RAPID DRUG SCREEN (HOSP PERFORMED) - Abnormal; Notable for the following:    Benzodiazepines POSITIVE (*)    All other components within normal limits  ACETAMINOPHEN LEVEL  CBC  ETHANOL  POCT PREGNANCY, URINE   No results found.   No diagnosis found.    MDM  Patient with bipolar, who wants to be evaluated for inpatient treatment. Psych hold orders have been placed. Patient moved to psych ED. Consulted with the ACT team.         Roxy Horseman, PA-C 05/18/12 1502

## 2012-05-18 NOTE — ED Provider Notes (Addendum)
Dr. Dan Humphreys has requested TSH, Free T3 and T4  Juliet Rude. Rubin Payor, MD 05/18/12 1929  Accepted at [facility]. Dr Isabel Caprice R. Rubin Payor, MD 05/18/12 2046

## 2012-05-18 NOTE — Progress Notes (Signed)
Adult Psychoeducational Group Note  Date:  05/18/2012 Time:  10:26 PM  Group Topic/Focus:  Wrap-Up Group:  Karaoke group    Additional Comments:  Pt. Attended and participated in [place] group  Meredith Staggers 05/18/2012, 10:26 PM

## 2012-05-18 NOTE — Tx Team (Addendum)
Initial Interdisciplinary Treatment Plan  PATIENT STRENGTHS: (choose at least two) Average or above average intelligence Capable of independent living General fund of knowledge  PATIENT STRESSORS: Marital or family conflict Medication change or noncompliance   PROBLEM LIST: Problem List/Patient Goals Date to be addressed Date deferred Reason deferred Estimated date of resolution  Mood Disorder      Depression                                                 DISCHARGE CRITERIA:  Improved stabilization in mood, thinking, and/or behavior Need for constant or close observation no longer present Verbal commitment to aftercare and medication compliance  PRELIMINARY DISCHARGE PLAN: Attend aftercare/continuing care group Outpatient therapy Return to previous living arrangement  PATIENT/FAMIILY INVOLVEMENT: This treatment plan has been presented to and reviewed with the patient, LATONA KRICHBAUM, and/or family member, .  The patient and family have been given the opportunity to ask questions and make suggestions.  Andrena Mews 05/18/2012, 11:20 PM

## 2012-05-18 NOTE — BH Assessment (Signed)
Assessment Note   Lindsey Davenport is an 32 y.o. female presenting to [facility] with history of depression and anxiety.  She states that she is also Bipolar. Patients spouse brought her the ED today. Patient explains that over the last 3 weeks she has "spiraled out of control". She is depressed with the associated symptoms: body aches, headaches, isolating self from others, loss of appetite, and crying spells. She also reports manic episodes, however; no symptoms of mania are present at this time. Patient is extremely calm and cooperative. Her affect is flat and sad. She has a depressed mood stating she wants to sleep all day. She states that she has had her medications were switched several times by her out patient provider Harle Stanford, MD) but nothing seems to work.  Patient denies current suicidal thoughts, however; reports suicidal ideations yesterday. She had no plan or intent yesterday; only thoughts. Today she contracts for safety. She denies history of suicide attempts. Patient hospitalized as a child due to depression. No hospitalizations since. She denies HI and AVH's. No alcohol or drug use reported.   Axis I: Bipolar, Depressed Axis II: Deferred Axis III:  Past Medical History  Diagnosis Date  . Thyroid disorder   . Bipolar 1 disorder   . Drug abuse     history of, not since 2 years ago  . Depression   . Anxiety   . Panic attack   . Herpes   . Headache     migraines  . GERD (gastroesophageal reflux disease)   . Hypothyroidism   . Hard of hearing    Axis IV: other psychosocial or environmental problems and problems related to social environment Axis V: 31-40 impairment in reality testing  Past Medical History:  Past Medical History  Diagnosis Date  . Thyroid disorder   . Bipolar 1 disorder   . Drug abuse     history of, not since 2 years ago  . Depression   . Anxiety   . Panic attack   . Herpes   . Headache     migraines  . GERD (gastroesophageal reflux disease)   .  Hypothyroidism   . Hard of hearing     Past Surgical History  Procedure Laterality Date  . Cesarean section  08/30/2010 x 4    Procedure: CESAREAN SECTION;  Surgeon: Lazaro Arms, MD;  Location: [facility];  Service: Gynecology;  Laterality: N/A;  . Tonsillectomy  as child  . Tubal ligation    . Wisdom tooth extraction    . Hysteroscopy  07/12/2011    Procedure: HYSTEROSCOPY WITH HYDROTHERMAL ABLATION;  Surgeon: Allie Bossier, MD;  Location: [facility];  Service: Gynecology;  Laterality: N/A;    Family History:  Family History  Problem Relation Age of Onset  . Cancer Paternal Grandmother     breast  . Cancer Maternal Grandmother     breast  . Cancer Maternal Grandfather     colon    Social History:  reports that she has been smoking Cigarettes.  She has a 9 pack-year smoking history. She has never used smokeless tobacco. She reports that  drinks alcohol. She reports that she uses illicit drugs (Cocaine).  Additional Social History:  Alcohol / Drug Use Pain Medications: SEE MAR Prescriptions: SEE MAR Over the Counter: SEE MAR History of alcohol / drug use?: No history of alcohol / drug abuse  CIWA: CIWA-Ar BP: 112/67 mmHg Pulse Rate: 63 COWS:    Allergies: No Known Allergies  Home Medications:  (Not in a hospital admission)  OB/GYN Status:  No LMP recorded. Patient has had an ablation.  General Assessment Data Location of Assessment: [facility] Living Arrangements: Spouse/significant other;Other (Comment) (spouse and 2 children (2 boys)) Can pt return to current living arrangement?: Yes Admission Status: Voluntary Is patient capable of signing voluntary admission?: Yes Transfer from: Acute Hospital Referral Source: Self/Family/Friend  Education Status Is patient currently in school?: No  Risk to self Suicidal Ideation: No Suicidal Intent: No Is patient at risk for suicide?: No Suicidal Plan?: No Access to Means: No What has been your use of drugs/alcohol within the last  12 months?:  (n/a) Previous Attempts/Gestures: No How many times?:  (0) Other Self Harm Risks:  (n/a) Triggers for Past Attempts:  (no previous attempts/gestures) Intentional Self Injurious Behavior: None Family Suicide History: No Recent stressful life event(s): Other (Comment) ("My medications are not working") Persecutory voices/beliefs?: No Depression: Yes Depression Symptoms: Feeling angry/irritable;Feeling worthless/self pity;Loss of interest in usual pleasures;Fatigue;Guilt;Isolating;Tearfulness;Despondent Substance abuse history and/or treatment for substance abuse?: No Suicide prevention information given to non-admitted patients: Not applicable  Risk to Others Homicidal Ideation: No Thoughts of Harm to Others: No Current Homicidal Intent: No Current Homicidal Plan: No Access to Homicidal Means: No Identified Victim:  (n/a) History of harm to others?: No Assessment of Violence: None Noted Violent Behavior Description:  (patient is calm and cooperative) Does patient have access to weapons?: No Criminal Charges Pending?: No Does patient have a court date: No  Psychosis Hallucinations: None noted Delusions: None noted  Mental Status Report Appear/Hygiene: Disheveled Eye Contact: Fair Motor Activity: Freedom of movement Speech: Logical/coherent Level of Consciousness: Alert Mood: Depressed;Sad Affect: Other (Comment) (extremely flat) Anxiety Level: Moderate Judgement: Unimpaired Orientation: Person;Place;Time;Situation Obsessive Compulsive Thoughts/Behaviors: None  Cognitive Functioning Concentration: Decreased Memory: Recent Intact;Remote Intact IQ: Average Insight: Fair Impulse Control: Fair Appetite: Poor Weight Loss:  (none reported) Weight Gain:  (none reported) Sleep: Increased Total Hours of Sleep:  ("I sleep all day on/off") Vegetative Symptoms: None  ADLScreening [facility] Services) Patient's cognitive ability adequate to safely complete  daily activities?: Yes Patient able to express need for assistance with ADLs?: Yes Independently performs ADLs?: Yes (appropriate for developmental age)  Abuse/Neglect [facility]) Physical Abuse: Denies Verbal Abuse: Denies Sexual Abuse: Denies  Prior Inpatient Therapy Prior Inpatient Therapy: Yes Prior Therapy Dates:  (childhood) Prior Therapy Facilty/Provider(s):  (patient unable to recall name of facility) Reason for Treatment:  (depression)  Prior Outpatient Therapy Prior Outpatient Therapy: Yes Prior Therapy Dates:  (currently) Prior Therapy Facilty/Provider(s):  [facility]) Reason for Treatment:  (medication managment for depression)  ADL Screening (condition at time of admission) Patient's cognitive ability adequate to safely complete daily activities?: Yes Patient able to express need for assistance with ADLs?: Yes Independently performs ADLs?: Yes (appropriate for developmental age) Weakness of Legs: None Weakness of Arms/Hands: None  Home Assistive Devices/Equipment Home Assistive Devices/Equipment: None    Abuse/Neglect Assessment (Assessment to be complete while patient is alone) Physical Abuse: Denies Verbal Abuse: Denies Sexual Abuse: Denies Exploitation of patient/patient's resources: Denies Self-Neglect: Denies Values / Beliefs Cultural Requests During Hospitalization: None Spiritual Requests During Hospitalization: None   Advance Directives (For Healthcare) Advance Directive: Patient does not have advance directive Nutrition Screen- [facility] Adult/[facility] Patient's home diet: Regular  Additional Information 1:1 In Past 12 Months?: No CIRT Risk: No Elopement Risk: No Does patient have medical clearance?: Yes     Disposition:  Disposition Initial Assessment Completed for  this Encounter: Yes Disposition of Patient: Other dispositions (Disposition pending telepsych consult) Other disposition(s): Other (Comment) (Telepsych initiated)  On  Site Evaluation by:   Reviewed with Physician:     Melynda Ripple [facility] 05/18/2012 5:22 PM

## 2012-05-18 NOTE — BHH Counselor (Signed)
Per Dr. Berlin Hun inpatient hospitalization is recommendation. Sts that patient is not suicidal however severely depressed. Dr. Berlin Hun also sts, "Patient has 4 boys and needs to be able to function in her household". Writer informed Dr. Berlin Hun that depression only is not criteria for [facility]. She sts, "I still would like patient hospitalized for her severe depression and start her on Latuda". Writer faxed admissions request form to [facility] requesting hospital admission.

## 2012-05-18 NOTE — BHH Counselor (Signed)
Telepsych initiated for recommendations regarding patients disposition.

## 2012-05-18 NOTE — BHH Counselor (Signed)
Lindsey Davenport, assessment counselor at [facility], submitted Pt for admission to [facility]. Rosey Bath, [facility] confirmed bed availability. Gave clinical report to Dr. Orson Aloe who said Pt needed to have additional labs including TSH, free T3 and free T4. Communicated this request to Lindsey Davenport who said labs have been requested but results will not be back today. Contacted Armandina Stammer, NP (Dr. Dan Humphreys was off call) and gave clinical report. She accepted Pt to the service of Dr. Henrietta Dine, room 503-1. Notified Lindsey Davenport that Pt has been accepted.  Harlin Rain Patsy Baltimore, LPC, [facility] Assessment Counselor

## 2012-05-19 ENCOUNTER — Encounter (HOSPITAL_COMMUNITY): Payer: Self-pay | Admitting: Psychiatry

## 2012-05-19 DIAGNOSIS — F3130 Bipolar disorder, current episode depressed, mild or moderate severity, unspecified: Principal | ICD-10-CM

## 2012-05-19 MED ORDER — TRAZODONE HCL 50 MG PO TABS
50.0000 mg | ORAL_TABLET | Freq: Every evening | ORAL | Status: DC | PRN
  Administered 2012-05-19 – 2012-05-21 (×5): 50 mg via ORAL
  Filled 2012-05-19: qty 1
  Filled 2012-05-19: qty 6
  Filled 2012-05-19 (×4): qty 1

## 2012-05-19 MED ORDER — RISPERIDONE 0.25 MG PO TABS
0.2500 mg | ORAL_TABLET | Freq: Two times a day (BID) | ORAL | Status: DC
  Administered 2012-05-19 – 2012-05-22 (×6): 0.25 mg via ORAL
  Filled 2012-05-19 (×4): qty 1
  Filled 2012-05-19 (×2): qty 6
  Filled 2012-05-19 (×4): qty 1

## 2012-05-19 MED ORDER — ENSURE COMPLETE PO LIQD
237.0000 mL | Freq: Two times a day (BID) | ORAL | Status: DC
  Administered 2012-05-19: 237 mL via ORAL

## 2012-05-19 MED ORDER — ADULT MULTIVITAMIN LIQUID CH: 5.0000 mL | Freq: Every day | ORAL | Status: DC

## 2012-05-19 MED ORDER — ADULT MULTIVITAMIN W/MINERALS CH
1.0000 | ORAL_TABLET | Freq: Every day | ORAL | Status: DC
  Administered 2012-05-19 – 2012-05-22 (×4): 1 via ORAL
  Filled 2012-05-19 (×6): qty 1

## 2012-05-19 NOTE — Progress Notes (Signed)
D.  Pleasant on approach, denies complaints at this time.  Interacting appropriately within milieu.  Denies SI/HI/hallucinations at this time.  A.  Support and encouragement offered  R.  Pt remains safe on unit, went to bed early, will continue to monitor.

## 2012-05-19 NOTE — Progress Notes (Signed)
Patient's coverage has been denied due to no reason for admission per UR/CM staff. Patient does have current aftercare in place with Valinda Hoar NP at [facility] office and Vanessa Kick at [facility] for therapy.  Appointments are unknown due to holiday. Patient has not attended any groups for the day and unknown her plan or where she will be staying at DC. Weekend staff made aware of situation and possible DC over weekend.  Will follow up if here on Monday.  Andres Shad, MSW Clinical Lead [phone]

## 2012-05-19 NOTE — Progress Notes (Signed)
Nutrition Brief Note  Patient identified on the Malnutrition Screening Tool (MST) Report  Body mass index is 21.09 kg/(m^2). Patient meets criteria for weight WNL based on current BMI.   Current diet order is regular, patient is consuming approximately very poor% of meals at this time. Labs and medications reviewed.   Patient sleeping.  Woke her. She would only nod head and not answer verbally.  Has not had breakfast or lunch today.  UBW was 130 labs.  Reports not eating prior to admit but would not say for how long.  Ht:  5'3", Wt:  119 lbs. Encouraged intake.  Will order Ensure Complete bid and MVI daily.  No futher nutrition interventions warranted at this time. If further nutrition issues arise, please consult RD.   Oran Rein, RD, LDN Clinical Inpatient Dietitian Pager:  [phone] Weekend and after hours pager:  [phone]

## 2012-05-19 NOTE — Progress Notes (Signed)
Adult Psychoeducational Group Note  Date:  05/19/2012 Time:  7:02 PM  Group Topic/Focus:  Stages of Change:   The focus of this group is to explain the stages of change and help patients identify changes they want to make upon discharge.  Participation Level:  Did Not Attend   Lindsey Davenport 05/19/2012, 7:02 PM

## 2012-05-19 NOTE — BHH Group Notes (Signed)
[facility] LCSW Group Therapy  05/19/2012 3:11 PM  Type of Therapy:  Group Therapy  Participation Level:  Did Not Attend   Nail, Catalina Gravel 05/19/2012, 3:11 PM

## 2012-05-19 NOTE — BHH Group Notes (Signed)
[facility] LCSW Aftercare Discharge Planning Group Note   05/19/2012 9:35 AM  Participation Quality:  DID NOT ATTEND    Nail, Catalina Gravel

## 2012-05-19 NOTE — Progress Notes (Signed)
D: Patient denies SI/HI and auditory and visual hallucinations. The patient has a depressed mood and affect. The patient has been isolating to her room for most of the day and states that she is "tired." The patient reports sleeping poorly and states that her appetite and energy level are low.  A: Patient given emotional support from RN. Patient encouraged to come to staff with concerns and/or questions. Patient's medication routine continued. Patient's orders and plan of care reviewed. Patient given encouragement to attend groups and to participate within the milieu.  R: Patient remains cooperative. Will continue to monitor patient q15 minutes for safety.
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
## 2012-05-19 NOTE — BHH Suicide Risk Assessment (Signed)
Suicide Risk Assessment  Admission Assessment     Nursing information obtained from:    Demographic factors:    Current Mental Status:   Alert and oriented to 4. Depressed mood, suicidal thoughts. Loss Factors:    Historical Factors:   Bipolar disorder Risk Reduction Factors:     CLINICAL FACTORS:   Bipolar Disorder:   Depressive phase  COGNITIVE FEATURES THAT CONTRIBUTE TO RISK:  Thought constriction (tunnel vision)    SUICIDE RISK:   Mild:  Suicidal ideation of limited frequency, intensity, duration, and specificity.  There are no identifiable plans, no associated intent, mild dysphoria and related symptoms, good self-control (both objective and subjective assessment), few other risk factors, and identifiable protective factors, including available and accessible social support.  PLAN OF CARE: Adjust medications as needed. Provide supportive counselling and education.   I certify that inpatient services furnished can reasonably be expected to improve the patient's condition.  Lindsey Davenport 05/19/2012, 11:00 AM
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
## 2012-05-19 NOTE — Tx Team (Signed)
  Interdisciplinary Treatment Plan Update   Date Reviewed:  05/19/2012  Time Reviewed:  11:30 AM  Progress in Treatment:   Attending groups: No Participating in groups: No Taking medication as prescribed: Yes  Tolerating medication: Yes Family/Significant other contact made: No  Patient understands diagnosis: Yes  As evidenced by asking for help with mood stabilization Discussing patient identified problems/goals with staff: Yes Medical problems stabilized or resolved: Yes Denies suicidal/homicidal ideation: Yes  In tx team Patient has not harmed self or others: Yes  For review of initial/current patient goals, please see plan of care.  Estimated Length of Stay:  3-5 days  Reason for Continuation of Hospitalization: Depression Medication stabilization  New Problems/Goals identified:  N/A  Discharge Plan or Barriers:   return home, follow up outpt  Additional Comments: Lindsey Davenport is an 32 y.o. female presenting to [facility] with history of depression and anxiety. She states that she is also Bipolar. Patients spouse brought her the ED today. Patient explains that over the last 3 weeks she has "spiraled out of control". She is depressed with the associated symptoms: body aches, headaches, isolating self from others, loss of appetite, and crying spells. She also reports manic episodes, however; no symptoms of mania are present at this time. Patient is extremely calm and cooperative. Her affect is flat and sad. She has a depressed mood stating she wants to sleep all day. She states that she has had her medications were switched several times by her out patient provider Harle Stanford, MD) but nothing seems to work. Patient denies current suicidal thoughts, however; reports suicidal ideations yesterday. She had no plan or intent yesterday; only thoughts. Today she contracts for safety. She denies history of suicide attempts. Patient hospitalized as a child due to depression. No  hospitalizations since. She denies HI and AVH's. No alcohol or drug use reported.   Attendees:  Signature: Patrick Jahnia Hewes, MD 05/19/2012 11:30 AM   Signature: Richelle Ito, LCSW 05/19/2012 11:30 AM  Signature: Fransisca Kaufmann, NP  05/19/2012 11:30 AM  Signature: Nestor Ramp, RN  05/19/2012 11:30 AM  Signature:  05/19/2012 11:30 AM  Signature:  05/19/2012 11:30 AM  Signature:   05/19/2012 11:30 AM  Signature:    Signature:    Signature:    Signature:    Signature:    Signature:      Scribe for Treatment Team:   Richelle Ito, LCSW  05/19/2012 11:30 AM

## 2012-05-20 DIAGNOSIS — F3130 Bipolar disorder, current episode depressed, mild or moderate severity, unspecified: Secondary | ICD-10-CM | POA: Diagnosis present

## 2012-05-20 DIAGNOSIS — F319 Bipolar disorder, unspecified: Secondary | ICD-10-CM | POA: Diagnosis present

## 2012-05-20 NOTE — ED Provider Notes (Signed)
Medical screening examination/treatment/procedure(s) were performed by non-physician practitioner and as supervising physician I was immediately available for consultation/collaboration.   Taishaun Levels, MD 05/20/12 0027 

## 2012-05-20 NOTE — Progress Notes (Signed)
Psychoeducational Group Note  Date:  05/19/2012 Time: 2000  Group Topic/Focus:  Wrap-Up Group:   The focus of this group is to help patients review their daily goal of treatment and discuss progress on daily workbooks.  Participation Level: Did Not Attend  Participation Quality:  Not Applicable  Affect:  Not Applicable  Cognitive:  Not Applicable  Insight:  Not Applicable  Engagement in Group: Not Applicable  Additional Comments:  The patient did not attend group this evening.   Lindsey Davenport S 05/20/2012, 12:11 AM

## 2012-05-20 NOTE — BHH Group Notes (Signed)
[facility] LCSW Group Therapy  05/20/2012 3:00-4:00pm  Summary of Progress/Problems:   The main focus of today's process group was for the patient to identify a coping technique that they use in their life which is not beneficial for them, and that they want to change.  The Stages of Change were explained to the group with an emphasis on how important it is to plan the change in their behavior deliberately.  They were then asked what is the single best coping skill that they have used to conquer anxiety or depression, as most of the patients identified those two issues as present in their lives.  The patient expressed that the most useful coping technique for her is prayer and that she could not possibly write down her prayers as suggested, because her mind is going too fast.  It was reiterated that this would be the design of writing them.  She was anxious and tearful throughout group, talked about the violent mood swings and how rapid they have become.  Type of Therapy:  Group Therapy  Participation Level:  Active  Participation Quality:  Appropriate, Attentive, Sharing and Supportive  Affect:  Anxious, Blunted, Depressed and Tearful  Cognitive:  Appropriate and Oriented  Insight:  Engaged  Engagement in Therapy:  Engaged  Modes of Intervention:  Discussion and Exploration and Socialization   Sarina Ser 05/20/2012, 6:24 PM

## 2012-05-20 NOTE — Progress Notes (Signed)
[facility] MD Progress Note  05/20/2012 9:14 AM Lindsey Davenport  MRN:  [id number] Subjective:  4/10 depression with no suicidal ideations, anxiety "little bit", really wants to go home-fixated on going home-difficult to redirect, tearful, wants to see her 2 and 33 yo boys--despite many explanations and discussion--remains sad and tearful, encouraged her to go to groups, attempted to refocus her Diagnosis:   Axis I: Bipolar, Depressed Axis II: Deferred Axis III:  Past Medical History  Diagnosis Date  . Thyroid disorder   . Bipolar 1 disorder   . Drug abuse     history of, not since 2 years ago  . Depression   . Anxiety   . Panic attack   . Herpes   . Headache     migraines  . GERD (gastroesophageal reflux disease)   . Hypothyroidism   . Hard of hearing    Axis IV: other psychosocial or environmental problems, problems related to social environment and problems with primary support group Axis V: 41-50 serious symptoms  ADL's:  Intact  Sleep: Fair  Appetite:  Fair  Suicidal Ideation:  Denies Homicidal Ideation:  Denies  Psychiatric Specialty Exam: Review of Systems  Constitutional: Negative.   HENT: Negative.   Eyes: Negative.   Respiratory: Negative.   Cardiovascular: Negative.   Gastrointestinal: Negative.   Genitourinary: Negative.   Musculoskeletal: Negative.   Skin: Negative.   Neurological: Negative.   Endo/Heme/Allergies: Negative.   Psychiatric/Behavioral: Positive for depression. The patient is nervous/anxious.     Blood pressure 91/61, pulse 68, temperature 98.3 F (36.8 C), temperature source Oral, resp. rate 16, height 5\' 3"  (1.6 m), weight 53.978 kg (119 lb).Body mass index is 21.09 kg/(m^2).  General Appearance: Casual  Eye Contact::  Fair  Speech:  Normal Rate  Volume:  Normal  Mood:  Anxious and Depressed  Affect:  Congruent  Thought Process:  Coherent  Orientation:  Full (Time, Place, and Person)  Thought Content:  WDL  Suicidal Thoughts:  No   Homicidal Thoughts:  No  Memory:  Immediate;   Fair Recent;   Fair Remote;   Fair  Judgement:  Fair  Insight:  Fair  Psychomotor Activity:  Decreased  Concentration:  Fair  Recall:  Fair  Akathisia:  No  Handed:  Right  AIMS (if indicated):     Assets:  Desire for Improvement Social Support  Sleep:  Number of Hours: 6.75   Current Medications: Current Facility-Administered Medications  Medication Dose Route Frequency Provider Last Rate Last Dose  . acetaminophen (TYLENOL) tablet 650 mg  650 mg Oral Q6H PRN Sanjuana Kava, NP      . alum & mag hydroxide-simeth (MAALOX/MYLANTA) 200-200-20 MG/5ML suspension 30 mL  30 mL Oral Q4H PRN Sanjuana Kava, NP      . feeding supplement (ENSURE COMPLETE) liquid 237 mL  237 mL Oral BID BM Jeoffrey Massed, RD   237 mL at 05/19/12 1421  . levothyroxine (SYNTHROID, LEVOTHROID) tablet 50 mcg  50 mcg Oral QAC breakfast Sanjuana Kava, NP   50 mcg at 05/20/12 0602  . magnesium hydroxide (MILK OF MAGNESIA) suspension 30 mL  30 mL Oral Daily PRN Sanjuana Kava, NP      . multivitamin with minerals tablet 1 tablet  1 tablet Oral Daily Himabindu Ravi, MD   1 tablet at 05/20/12 0906  . risperiDONE (RISPERDAL) tablet 0.25 mg  0.25 mg Oral BID AC Fransisca Kaufmann, NP   0.25 mg at 05/20/12 0601  .  topiramate (TOPAMAX) tablet 100 mg  100 mg Oral QHS Sanjuana Kava, NP   100 mg at 05/19/12 2144  . traZODone (DESYREL) tablet 50 mg  50 mg Oral QHS PRN,MR X 1 Fransisca Kaufmann, NP   50 mg at 05/19/12 2144    Lab Results:  Results for orders placed during the hospital encounter of 05/18/12 (from the past 48 hour(s))  URINE RAPID DRUG SCREEN (HOSP PERFORMED)     Status: Abnormal   Collection Time    05/18/12  1:47 PM      Result Value Range   Opiates NONE DETECTED  NONE DETECTED   Cocaine NONE DETECTED  NONE DETECTED   Benzodiazepines POSITIVE (*) NONE DETECTED   Amphetamines NONE DETECTED  NONE DETECTED   Tetrahydrocannabinol NONE DETECTED  NONE DETECTED   Barbiturates NONE  DETECTED  NONE DETECTED   Comment:            DRUG SCREEN FOR MEDICAL PURPOSES     ONLY.  IF CONFIRMATION IS NEEDED     FOR ANY PURPOSE, NOTIFY LAB     WITHIN 5 DAYS.                LOWEST DETECTABLE LIMITS     FOR URINE DRUG SCREEN     Drug Class       Cutoff (ng/mL)     Amphetamine      1000     Barbiturate      200     Benzodiazepine   200     Tricyclics       300     Opiates          300     Cocaine          300     THC              50  POCT PREGNANCY, URINE     Status: None   Collection Time    05/18/12  1:52 PM      Result Value Range   Preg Test, Ur NEGATIVE  NEGATIVE   Comment:            THE SENSITIVITY OF THIS     METHODOLOGY IS >24 mIU/mL  ACETAMINOPHEN LEVEL     Status: None   Collection Time    05/18/12  1:56 PM      Result Value Range   Acetaminophen (Tylenol), Serum <15.0  10 - 30 ug/mL   Comment:            THERAPEUTIC CONCENTRATIONS VARY     SIGNIFICANTLY. A RANGE OF 10-30     ug/mL MAY BE AN EFFECTIVE     CONCENTRATION FOR MANY PATIENTS.     HOWEVER, SOME ARE BEST TREATED     AT CONCENTRATIONS OUTSIDE THIS     RANGE.     ACETAMINOPHEN CONCENTRATIONS     >150 ug/mL AT 4 HOURS AFTER     INGESTION AND >50 ug/mL AT 12     HOURS AFTER INGESTION ARE     OFTEN ASSOCIATED WITH TOXIC     REACTIONS.  CBC     Status: None   Collection Time    05/18/12  1:56 PM      Result Value Range   WBC 7.1  4.0 - 10.5 K/uL   RBC 4.68  3.87 - 5.11 MIL/uL   Hemoglobin 14.9  12.0 - 15.0 g/dL   HCT 16.1  09.6 - 04.5 %  MCV 89.7  78.0 - 100.0 fL   MCH 31.8  26.0 - 34.0 pg   MCHC 35.5  30.0 - 36.0 g/dL   RDW 21.3  08.6 - 57.8 %   Platelets 201  150 - 400 K/uL  COMPREHENSIVE METABOLIC PANEL     Status: Abnormal   Collection Time    05/18/12  1:56 PM      Result Value Range   Sodium 141  135 - 145 mEq/L   Potassium 3.8  3.5 - 5.1 mEq/L   Chloride 107  96 - 112 mEq/L   CO2 28  19 - 32 mEq/L   Glucose, Bld 91  70 - 99 mg/dL   BUN 14  6 - 23 mg/dL   Creatinine, Ser  4.69  0.50 - 1.10 mg/dL   Calcium 9.4  8.4 - 62.9 mg/dL   Total Protein 6.4  6.0 - 8.3 g/dL   Albumin 3.7  3.5 - 5.2 g/dL   AST 12  0 - 37 U/L   ALT 11  0 - 35 U/L   Alkaline Phosphatase 60  39 - 117 U/L   Total Bilirubin 0.2 (*) 0.3 - 1.2 mg/dL   GFR calc non Af Amer >90  >90 mL/min   GFR calc Af Amer >90  >90 mL/min   Comment:            The eGFR has been calculated     using the CKD EPI equation.     This calculation has not been     validated in all clinical     situations.     eGFR's persistently     <90 mL/min signify     possible Chronic Kidney Disease.  ETHANOL     Status: None   Collection Time    05/18/12  1:56 PM      Result Value Range   Alcohol, Ethyl (B) <11  0 - 11 mg/dL   Comment:            LOWEST DETECTABLE LIMIT FOR     SERUM ALCOHOL IS 11 mg/dL     FOR MEDICAL PURPOSES ONLY  SALICYLATE LEVEL     Status: Abnormal   Collection Time    05/18/12  1:56 PM      Result Value Range   Salicylate Lvl <2.0 (*) 2.8 - 20.0 mg/dL  TSH     Status: None   Collection Time    05/18/12  2:00 PM      Result Value Range   TSH 0.829  0.350 - 4.500 uIU/mL  T3, FREE     Status: None   Collection Time    05/18/12  2:00 PM      Result Value Range   T3, Free 2.3  2.3 - 4.2 pg/mL  T4, FREE     Status: None   Collection Time    05/18/12  2:00 PM      Result Value Range   Free T4 0.86  0.80 - 1.80 ng/dL    Physical Findings: AIMS: Facial and Oral Movements Muscles of Facial Expression: None, normal Lips and Perioral Area: None, normal Jaw: None, normal Tongue: None, normal,Extremity Movements Upper (arms, wrists, hands, fingers): None, normal Lower (legs, knees, ankles, toes): None, normal, Trunk Movements Neck, shoulders, hips: None, normal, Overall Severity Severity of abnormal movements (highest score from questions above): None, normal Incapacitation due to abnormal movements: None, normal Patient's awareness of abnormal movements (rate only patient's report):  No  Awareness, Dental Status Current problems with teeth and/or dentures?: No Does patient usually wear dentures?: No  CIWA:    COWS:     Treatment Plan Summary: Daily contact with patient to assess and evaluate symptoms and progress in treatment Medication management  Plan:  Review of chart, vital signs, medications, and notes. 1-Individual and group therapy 2-Medication management for depression and anxiety:  Medications reviewed with the patient and she stated no untoward effects 3-Coping skills for depression, anxiety, and mood disorder 4-Continue crisis stabilization and management 5-Address health issues--monitoring vital signs, stable 6-Treatment plan in progress to prevent relapse of depression and mood instability  Medical Decision Making Problem Points:  Established problem, stable/improving (1) and Review of psycho-social stressors (1) Data Points:  Review of medication regiment & side effects (2)  I certify that inpatient services furnished can reasonably be expected to improve the patient's condition.   Nanine Means, PMH-NP 05/20/2012, 9:14 AM

## 2012-05-20 NOTE — Progress Notes (Signed)
D) Pt anxious and crying today. Wanting to leave because it is [place] and she wants to be with her children. Sad. Rates her depression and hopelessness both at a 4. Denies SI and HI. Is aware that she is not stable yet but is so upset that it is [place] and she cannot be with her children and make them [employer]. A) Talked with Pt's husband and Pt's children are young. Arranged with Pt's husband to bring the children to visit the Pt and for Pt to fix the baskets for her children with her husband on the unit. She can then give her children the [employer] here tomorrow.  R) Pt states that she knows that she is really not ready to go home, she was just having a very hard time dealing with the fact that she would be away from her children on Easter. States this is not perfect, but it is workable.

## 2012-05-20 NOTE — Progress Notes (Signed)
D.  Pt anxious this evening, some complaint of restless leg while trying to sleep.  Positive for evening group, interacting appropriately within the milieu.  Denies SI/HI/hallucinations at this time.  Pleased that she will at least be able to present her children with [employer].  A.  Support and encouragement offered, medication given as ordered to help Pt rest tonight.  R.  Pt in bed resting with eyes closed, respirations even and unlabored.  No sign of stress noted.  Will continue to monitor.

## 2012-05-21 DIAGNOSIS — F314 Bipolar disorder, current episode depressed, severe, without psychotic features: Secondary | ICD-10-CM

## 2012-05-21 DIAGNOSIS — F411 Generalized anxiety disorder: Secondary | ICD-10-CM

## 2012-05-21 MED ORDER — CLONAZEPAM 0.5 MG PO TABS
0.5000 mg | ORAL_TABLET | Freq: Once | ORAL | Status: AC
  Administered 2012-05-21: 0.5 mg via ORAL
  Filled 2012-05-21: qty 1

## 2012-05-21 NOTE — Progress Notes (Signed)
D) Pt was able to see her children today and given them their Iona Hansen which helped her a lot. States that she does not want to be here and wants to go home.  Pt was given education concerning her illness of Bipolar and how it might affect her children in the future. That allowing her children to come here and see her (they wanted to) it educates them.  A) Education given, Praise and reassurance given. Encouraged Pt to attend the groups R) Pt has attended the groups and states that she is ready to go home tomorrow.

## 2012-05-21 NOTE — Progress Notes (Addendum)
D.  Pt very much wishing to go home tomorrow as her husband has missed much work.  Pt states she had a decent day with her children here visiting, but believes she will feel overall better upon returning home.  She has already set up an appointment with her therapist for next Wednesday and plans to have appointment with psychiatrist set up before she is discharged.  She expects to discuss this with treatment team tomorrow.  Denies SI/HI/hallucinations at this time. Interacting appropriately within milieu. Still complaining of restless leg at night and would like something to prevent this.  Positive for evening group.  A.  Support and encouragement offered, doctor notified of restless leg complaint and orders received.  R.  Pt remains safe on unit, will continue to monitor.  0005  Pt still awake and complaining of restless leg issues.  Will discuss in AM with doctor.

## 2012-05-21 NOTE — Progress Notes (Signed)
[facility] MD Progress Note  05/21/2012 12:34 PM Lindsey Davenport  MRN:  [id number] Subjective:  Denies depression and suicidal ideations, anxiety "a little bit", appetite good, complained of restless legs at bedtime--will continue to monitor, first time for this occurrence, not present of assessment today.  Lindsey Davenport smiled when she talked about her husband and her two boys coming to visit at lunch.  She is not fixated on leaving like she was yesterday but would like to discharge tomorrow and worried about her husband having to care for her boys and his work.  Diagnosis:   Axis I: Anxiety Disorder NOS and Bipolar, Depressed Axis II: Deferred Axis III:  Past Medical History  Diagnosis Date  . Thyroid disorder   . Bipolar 1 disorder   . Drug abuse     history of, not since 2 years ago  . Depression   . Anxiety   . Panic attack   . Herpes   . Headache     migraines  . GERD (gastroesophageal reflux disease)   . Hypothyroidism   . Hard of hearing    Axis IV: other psychosocial or environmental problems, problems related to social environment and problems with primary support group Axis V: 41-50 serious symptoms  ADL's:  Intact  Sleep: Fair  Appetite:  Good  Suicidal Ideation:  Denies Homicidal Ideation:  Denies  Psychiatric Specialty Exam: Review of Systems  Constitutional: Negative.   HENT: Negative.   Eyes: Negative.   Respiratory: Negative.   Cardiovascular: Negative.   Gastrointestinal: Negative.   Genitourinary: Negative.   Musculoskeletal: Negative.   Skin: Negative.   Neurological: Negative.   Endo/Heme/Allergies: Negative.   Psychiatric/Behavioral: The patient is nervous/anxious.     Blood pressure 91/64, pulse 76, temperature 98 F (36.7 C), temperature source Oral, resp. rate 18, height 5\' 3"  (1.6 m), weight 53.978 kg (119 lb).Body mass index is 21.09 kg/(m^2).  General Appearance: Casual  Eye Contact::  Fair  Speech:  Normal Rate  Volume:  Normal  Mood:   Reports no depression   Affect:  Appears depressed  Thought Process:  Coherent  Orientation:  Full (Time, Place, and Person)  Thought Content:  WDL  Suicidal Thoughts:  No  Homicidal Thoughts:  No  Memory:  Immediate;   Fair Recent;   Fair Remote;   Fair  Judgement:  Fair  Insight:  Fair  Psychomotor Activity:  Normal  Concentration:  Fair  Recall:  Fair  Akathisia:  No  Handed:  Right  AIMS (if indicated):     Assets:  Physical Health Resilience Social Support  Sleep:  Number of Hours: 6.25   Current Medications: Current Facility-Administered Medications  Medication Dose Route Frequency Provider Last Rate Last Dose  . acetaminophen (TYLENOL) tablet 650 mg  650 mg Oral Q6H PRN Sanjuana Kava, NP      . alum & mag hydroxide-simeth (MAALOX/MYLANTA) 200-200-20 MG/5ML suspension 30 mL  30 mL Oral Q4H PRN Sanjuana Kava, NP      . feeding supplement (ENSURE COMPLETE) liquid 237 mL  237 mL Oral BID BM Jeoffrey Massed, RD   237 mL at 05/19/12 1421  . levothyroxine (SYNTHROID, LEVOTHROID) tablet 50 mcg  50 mcg Oral QAC breakfast Sanjuana Kava, NP   50 mcg at 05/21/12 0536  . magnesium hydroxide (MILK OF MAGNESIA) suspension 30 mL  30 mL Oral Daily PRN Sanjuana Kava, NP      . multivitamin with minerals tablet 1 tablet  1 tablet  Oral Daily Patrick North, MD   1 tablet at 05/21/12 0818  . risperiDONE (RISPERDAL) tablet 0.25 mg  0.25 mg Oral BID AC Fransisca Kaufmann, NP   0.25 mg at 05/21/12 0536  . topiramate (TOPAMAX) tablet 100 mg  100 mg Oral QHS Sanjuana Kava, NP   100 mg at 05/20/12 2103  . traZODone (DESYREL) tablet 50 mg  50 mg Oral QHS PRN,MR X 1 Fransisca Kaufmann, NP   50 mg at 05/20/12 2145    Lab Results: No results found for this or any previous visit (from the past 48 hour(s)).  Physical Findings: AIMS: Facial and Oral Movements Muscles of Facial Expression: None, normal Lips and Perioral Area: None, normal Jaw: None, normal Tongue: None, normal,Extremity Movements Upper (arms,  wrists, hands, fingers): None, normal Lower (legs, knees, ankles, toes): None, normal, Trunk Movements Neck, shoulders, hips: None, normal, Overall Severity Severity of abnormal movements (highest score from questions above): None, normal Incapacitation due to abnormal movements: None, normal Patient's awareness of abnormal movements (rate only patient's report): No Awareness, Dental Status Current problems with teeth and/or dentures?: No Does patient usually wear dentures?: No  CIWA:    COWS:     Treatment Plan Summary: Daily contact with patient to assess and evaluate symptoms and progress in treatment Medication management  Plan:  Review of chart, vital signs, medications, and notes. 1-Individual and group therapy 2-Medication management for depression and anxiety:  Medications reviewed with the patient and she stated restless legs last night but has never occurred before, monitoring continues 3-Coping skills for depression, anxiety, and mood disorder 4-Continue crisis stabilization and management 5-Address health issues--monitoring vital signs, stable 6-Treatment plan in progress to prevent relapse of depression, mood instability, and anxiety  Medical Decision Making Problem Points:  Established problem, stable/improving (1) and Review of psycho-social stressors (1) Data Points:  Review of medication regiment & side effects (2)  I certify that inpatient services furnished can reasonably be expected to improve the patient's condition.   Nanine Means, PMH-NP 05/21/2012, 12:34 PM

## 2012-05-21 NOTE — Progress Notes (Signed)
[facility] Group Notes:  (Nursing/MHT/Case Management/Adjunct)  Date:  05/20/2012 Time:  2000  Type of Therapy:  Psychoeducational Skills  Participation Level:  Active  Participation Quality:  Attentive  Affect:  Appropriate  Cognitive:  Appropriate  Insight:  Improving  Engagement in Group:  Improving  Modes of Intervention:  Education  Summary of Progress/Problems: This was the first time that the patient attended wrap up group. The patient expressed to the group that she had a better day today.  She attributed this improvement to getting out of bed. The patient was visited by her husband this evening. She also mentioned that she learned from the groups. Her goal for tomorrow is to have a better day.   Hazle Coca S 05/21/2012, 12:41 AM

## 2012-05-22 ENCOUNTER — Encounter (HOSPITAL_COMMUNITY): Payer: Self-pay | Admitting: *Deleted

## 2012-05-22 MED ORDER — TOPIRAMATE 100 MG PO TABS: 100.0000 mg | ORAL_TABLET | Freq: Every day | ORAL | Status: DC

## 2012-05-22 MED ORDER — BENZTROPINE MESYLATE 0.5 MG PO TABS: 0.5000 mg | ORAL_TABLET | Freq: Every day | ORAL | Status: DC

## 2012-05-22 MED ORDER — BENZTROPINE MESYLATE 0.5 MG PO TABS
0.5000 mg | ORAL_TABLET | Freq: Every day | ORAL | Status: DC
  Administered 2012-05-22: 0.5 mg via ORAL
  Filled 2012-05-22: qty 1
  Filled 2012-05-22: qty 3
  Filled 2012-05-22 (×2): qty 1

## 2012-05-22 MED ORDER — LEVOTHYROXINE SODIUM 50 MCG PO TABS: 50.0000 ug | ORAL_TABLET | Freq: Every day | ORAL | Status: DC

## 2012-05-22 MED ORDER — TRAZODONE HCL 50 MG PO TABS: 50.0000 mg | ORAL_TABLET | Freq: Every evening | ORAL | Status: DC | PRN

## 2012-05-22 MED ORDER — RISPERIDONE 0.25 MG PO TABS: 0.2500 mg | ORAL_TABLET | Freq: Two times a day (BID) | ORAL | Status: DC

## 2012-05-22 NOTE — [facility] Group Notes (Signed)
[facility] LCSW Aftercare Discharge Planning Group Note   05/22/2012 11:18 AM  Participation Quality:  Active, engaged and focused  Mood/Affect:  Appropriate, bright and reports feeling so much better  Depression Rating:  4  Anxiety Rating:  4  Thoughts of Suicide:  No Will you contract for safety?   NA  Current AVH:  NA  Plan for Discharge/Comments:  Patient to return home with husband at DC. Patient has follow up appointments in place and arranged.  Patient reports she is doing so much better since her medications have been adjusted. Patient states she has been sleeping and wants to go home today. She is bright, focused, and engaged all throughout group. Reports with the medications changes and support from group, her experience has been well.  Transportation Means: Husband will come this afternoon and pick patient up  Supports: Husband, family, children.  Nail, Catalina Gravel

## 2012-05-22 NOTE — Progress Notes (Signed)
[facility] Adult Case Management Discharge Plan :  Will you be returning to the same living situation after discharge: Yes,  home with husband At discharge, do you have transportation home?:Yes,  husband to come and transport Do you have the ability to pay for your medications:Yes,  no barriers  Release of information consent forms completed and in the chart;  Patient's signature needed at discharge.  Patient to Follow up at: Follow-up Information   Follow up with Valinda Hoar at [facility] On 05/29/2012. (Appointment for 2:00pm)    Contact information:   [address] [place], [place] [address] [phone]      Follow up with June Leap at [facility]. (Wednesday at 10:30am 4/30.  This is for therapy and to continue your set appointments)    Contact information:   [address] . [place], [place] Ph [phone]      Patient denies SI/HI:   Yes,  no reports    Safety Planning and Suicide Prevention discussed:  Yes,  completed with patient and husband.  Patient given education via phone and given information from [facility] pamphlet.   Nail, Catalina Gravel 05/22/2012, 11:25 AM

## 2012-05-22 NOTE — Tx Team (Signed)
Interdisciplinary Treatment Plan Update   Date Reviewed:  05/22/2012  Time Reviewed:  11:21 AM  Progress in Treatment:   Attending groups: Yes Participating in groups: Yes Taking medication as prescribed: Yes  Tolerating medication: Yes Family/Significant other contact made: Yes, husband contacted with patient permission  Patient understands diagnosis: Yes AEB reporting being unstable and off her medications causing her to cycle Discussing patient identified problems/goals with staff: Yes Medical problems stabilized or resolved: Yes Denies suicidal/homicidal ideation: Yes Patient has not harmed self or others: Yes For review of initial/current patient goals, please see plan of care.  Estimated Length of Stay:  DC today  Reasons for Continued Hospitalization:  None patient has met goals  New Problems/Goals identified:  none  Discharge Plan or Barriers:   No barriers at this time. Patient reports she will return with husband and feels much more stable than before she came to the hospital.  Patient to follow up with current outpatient providers.  Additional Comments:  Patient's medications have been adjusted. She reports restless leg syndrome and feeling tight all throughout her body. MD feels this is a side effect from the Risperdal, and patient started on Cogentin Patient doing well and reports not problems. Adamant about leaving and feeling stable. Patient to DC today.  Attendees:  Signature:Crystal Sharol Harness , RN  05/22/2012 11:21 AM   Signature: Soundra Pilon, MD 05/22/2012 11:21 AM  Signature:G. Rutherford Limerick, MD 05/22/2012 11:21 AM  Signature: Ashley Jacobs, LCSW 05/22/2012 11:21 AM  Signature: Glennie Hawk. NP 05/22/2012 11:21 AM  Signature: Arloa Koh, RN 05/22/2012 11:21 AM  Signature:   05/22/2012 11:21 AM  Signature:    Signature:    Signature:    Signature:    Signature:    Signature:      Scribe for Treatment Team:   Lorenza Chick, Catalina Gravel,  05/22/2012 11:21 AM

## 2012-05-22 NOTE — Progress Notes (Signed)
Adult Psychoeducational Group Note  Date:  05/22/2012 Time:  11:00AM Group Topic/Focus:  Wellness Toolbox:   The focus of this group is to discuss various aspects of wellness, balancing those aspects and exploring ways to increase the ability to experience wellness.  Patients will create a wellness toolbox for use upon discharge.  Participation Level:  Active  Participation Quality:  Appropriate and Attentive  Affect:  Appropriate  Cognitive:  Alert and Appropriate  Insight: Appropriate  Engagement in Group:  Engaged  Modes of Intervention:  Discussion  Additional Comments:   Pt. Was attentive and appropriate during today's group discussion. Pt was able to take self care assessment. Pt was able to rate on scale 0-3 on emotional self care, spiritual self care relationship self care, physical self care, and psychological self care.  Bing Plume D 05/22/2012, 1:40 PM

## 2012-05-22 NOTE — Progress Notes (Signed)
Patient ID: Lindsey Davenport, female   DOB: 12-15-1980, 32 y.o.   MRN: [id number] Patient is discharged ambulatory to ride home with her husband.  She denies SI/HI.  He verbalizes understanding of her  medications and his follow up.  She was given samples and scripts bu MD.  She verbalizes great improvement and her outlook.

## 2012-05-22 NOTE — [facility] Suicide Risk Assessment (Signed)
Suicide Risk Assessment  Discharge Assessment     Demographic Factors:  Female, caucasian, married  Mental Status Per Nursing Assessment::   On Admission:     Current Mental Status by Physician: Patient alert and oriented to 4. Denies AH/VH/SI/HI.  Loss Factors: NA  Historical Factors: Impulsivity  Risk Reduction Factors:   Positive social support and Positive coping skills or problem solving skills  Continued Clinical Symptoms:  Bipolar Disorder:   Mixed State  Cognitive Features That Contribute To Risk:  Cognitively intact  Suicide Risk:  Minimal: No identifiable suicidal ideation.  Patients presenting with no risk factors but with morbid ruminations; may be classified as minimal risk based on the severity of the depressive symptoms  Discharge Diagnoses:   AXIS I:  Bipolar, mixed AXIS II:  Deferred AXIS III:   Past Medical History  Diagnosis Date  . Thyroid disorder   . Bipolar 1 disorder   . Drug abuse     history of, not since 2 years ago  . Depression   . Anxiety   . Panic attack   . Herpes   . Headache     migraines  . GERD (gastroesophageal reflux disease)   . Hypothyroidism   . Hard of hearing    AXIS IV:  other psychosocial or environmental problems AXIS V:  61-70 mild symptoms  Plan Of Care/Follow-up recommendations:  Activity:  as tolerated Diet:  regular Follow up with outpatient appointments.  Is patient on multiple antipsychotic therapies at discharge:  No   Has Patient had three or more failed trials of antipsychotic monotherapy by history:  No  Recommended Plan for Multiple Antipsychotic Therapies: NA  Lindsey Davenport 05/22/2012, 10:03 AM

## 2012-05-22 NOTE — Progress Notes (Signed)
[facility] Group Notes:  (Nursing/MHT/Case Management/Adjunct)  Date:  05/21/2012  Time:  2000  Type of Therapy:  Psychoeducational Skills  Participation Level:  Active  Participation Quality:  Attentive  Affect:  Appropriate  Cognitive:  Alert  Insight:  Improving  Engagement in Group:  Engaged  Modes of Intervention:  Education  Summary of Progress/Problems: The patient expressed in group that she had a difficult time when her children came in to visit her. The visit with her husband was more upbeat. Her goal for tomorrow is to get discharged.   Hazle Coca S 05/22/2012, 3:39 AM

## 2012-05-22 NOTE — Discharge Summary (Signed)
Physician Discharge Summary Note  Patient:  Lindsey Davenport is an 32 y.o., female MRN:  [id number] DOB:  [birth year] Patient phone:  [phone] (home)  Patient address:   [address]  [place] [address],   Date of Admission:  05/18/2012 Date of Discharge: 05/22/12  Reason for Admission:  Depression with mood instability  Discharge Diagnoses: Active Problems:   Bipolar I disorder, most recent episode depressed  Review of Systems  Constitutional: Negative.   HENT: Negative.   Eyes: Negative.   Respiratory: Negative.   Cardiovascular: Negative.   Gastrointestinal: Negative.   Genitourinary: Negative.   Musculoskeletal: Negative.   Skin: Negative.   Neurological: Negative.   Endo/Heme/Allergies: Negative.   Psychiatric/Behavioral: Positive for depression. Negative for suicidal ideas, hallucinations, memory loss and substance abuse. The patient is not nervous/anxious and does not have insomnia.    Axis Diagnosis:   AXIS I:  Anxiety Disorder NOS and Bipolar, Depressed AXIS II:  Deferred AXIS III:   Past Medical History  Diagnosis Date  . Thyroid disorder   . Bipolar 1 disorder   . Drug abuse     history of, not since 2 years ago  . Depression   . Anxiety   . Panic attack   . Herpes   . Headache     migraines  . GERD (gastroesophageal reflux disease)   . Hypothyroidism   . Hard of hearing    AXIS IV:  other psychosocial or environmental problems, problems related to social environment and problems with primary support group AXIS V:  61-70 mild symptoms  Level of Care:  OP  Hospital Course:  Lindsey Davenport is an 32 y.o. female presenting to [facility] with history of depression and anxiety. She states that she is also Bipolar. Patients spouse brought her the ED today. Patient explains that over the last 3 weeks she has "spiraled out of control". She is depressed with the associated symptoms: body aches, headaches, isolating self from others,  loss of appetite, and crying spells. She also reports manic episodes, however; no symptoms of mania are present at this time. Patient is extremely calm and cooperative. Her affect is flat and sad. She has a depressed mood stating she wants to sleep all day. She states that she has had her medications were switched several times by her out patient provider Harle Stanford, MD) but nothing seems to work. Patient denies current suicidal thoughts, however; reports suicidal ideations yesterday. She had no plan or intent yesterday; only thoughts. Today she contracts for safety. She denies history of suicide attempts. Patient hospitalized as a child due to depression. No hospitalizations since. She denies HI and AVH's. No alcohol or drug use reported.  The duration of stay was four days.The patient was seen and evaluated by the Treatment team consisting of Psychiatrist, NP-C, RN, Case Manager, and Therapist for evaluation and treatment plan with goal of stabilization upon discharge. The patient's physical and mental health problems were identified and treated appropriately.      Multiple modalities of treatment were used including medication, individual and group therapies, unit programming, improved nutrition, physical activity, and family sessions as needed. The patient's prior to admission medications of Topamax 100 mg hs and Synthroid 50 mcg daily were continued. Lindsey Davenport's mood was very depressed and labile upon admission. She had symptoms of depression, tearfulness and extreme irritability at the time of her admission. The patient was started on Risperdal 0.25 mg to help improve her mood. Also Trazodone  50 mg was started to help with sleep and depressive symptoms. By the third day of hospital stay the patient's mood had significantly stabilized. She reported improvement with the Risperdal and voiced readiness to go home. The patient did report some feelings of muscle restlessness and was started on cogentin 0.5 mg po  daily, which was started prior to her discharge. Patient was encouraged to let her outpatient provider know if the symptoms continued to be a problem.      The symptoms of mood instability and depression were monitored daily by evaluation by clinical provider.  The patient's mental and emotional status was evaluated by a daily self inventory completed by the patient.      Improvement was demonstrated by declining numbers on the self assessment, improving vital signs, increased cognition, and improvement in mood, sleep, appetite as well as a reduction in physical symptoms.       The patient was evaluated and found to be stable enough for discharge and was released to home per the initial plan of treatment. The patient denied any SI/HI/AVH to the treatment team prior to her discharge. Lindsey Davenport was deemed emotionally and physically stable for discharge.   Mental Status Exam:  For mental status exam please see mental status exam and  suicide risk assessment completed by attending physician prior to discharge.    Consults:  None  Significant Diagnostic Studies:  labs: Admission labs reviewed and were stable  Discharge Vitals:   Blood pressure 93/64, pulse 79, temperature 97.7 F (36.5 C), temperature source Oral, resp. rate 16, height 5\' 3"  (1.6 m), weight 53.978 kg (119 lb). Body mass index is 21.09 kg/(m^2). Lab Results:   No results found for this or any previous visit (from the past 72 hour(s)).  Physical Findings: AIMS: Facial and Oral Movements Muscles of Facial Expression: None, normal Lips and Perioral Area: None, normal Jaw: None, normal Tongue: None, normal,Extremity Movements Upper (arms, wrists, hands, fingers): None, normal Lower (legs, knees, ankles, toes): None, normal, Trunk Movements Neck, shoulders, hips: None, normal, Overall Severity Severity of abnormal movements (highest score from questions above): None, normal Incapacitation due to abnormal movements: None,  normal Patient's awareness of abnormal movements (rate only patient's report): No Awareness, Dental Status Current problems with teeth and/or dentures?: No Does patient usually wear dentures?: No  CIWA:    COWS:     Psychiatric Specialty Exam: See Psychiatric Specialty Exam and Suicide Risk Assessment completed by Attending Physician prior to discharge.  Discharge destination:  Home  Is patient on multiple antipsychotic therapies at discharge:  No   Has Patient had three or more failed trials of antipsychotic monotherapy by history:  No  Recommended Plan for Multiple Antipsychotic Therapies: N/A  Discharge Orders   Future Appointments Provider Department Dept Phone   06/20/2012 9:30 AM Eustaquio Boyden, MD [facility] [phone]   Future Orders Complete By Expires     Activity as tolerated - No restrictions  As directed         Medication List    STOP taking these medications       ALPRAZolam 1 MG tablet  Commonly known as:  XANAX     amoxicillin 500 MG tablet  Commonly known as:  AMOXIL     lisdexamfetamine 60 MG capsule  Commonly known as:  VYVANSE      TAKE these medications     Indication   benztropine 0.5 MG tablet  Commonly known as:  COGENTIN  Take 1  tablet (0.5 mg total) by mouth daily. For sensation of muscle tightness   Indication:  Extrapyramidal Reaction caused by Medications     levothyroxine 50 MCG tablet  Commonly known as:  SYNTHROID, LEVOTHROID  Take 1 tablet (50 mcg total) by mouth daily. For underactive thyroid.   Indication:  Underactive Thyroid     risperiDONE 0.25 MG tablet  Commonly known as:  RISPERDAL  Take 1 tablet (0.25 mg total) by mouth 2 (two) times daily before a meal. For mood control   Indication:  Manic-Depression     topiramate 100 MG tablet  Commonly known as:  TOPAMAX  Take 1 tablet (100 mg total) by mouth at bedtime. For mood control.   Indication:  Manic-Depression that is Resistant to Treatment      traZODone 50 MG tablet  Commonly known as:  DESYREL  Take 1 tablet (50 mg total) by mouth at bedtime as needed and may repeat dose one time if needed for sleep.   Indication:  Trouble Sleeping, Major Depressive Disorder           Follow-up Information   Follow up with Valinda Hoar at [facility] On 05/29/2012. (Appointment for 2:00pm)    Contact information:   [address] [place], [place] [address] [phone]      Follow up with June Leap at [facility]. (Wednesday at 10:30am 4/30.  This is for therapy and to continue your set appointments)    Contact information:   [address] . [place], [place] [phone]      Follow-up recommendations:  Activity:  Resume usual activities Diet:  Regular  Comments:   Take all your medications as prescribed by your mental healthcare provider.  Report any adverse effects and or reactions from your medicines to your outpatient provider promptly.  Patient is instructed and cautioned to not engage in alcohol and or illegal drug use while on prescription medicines.  In the event of worsening symptoms, patient is instructed to call the crisis hotline, 911 and or go to the nearest ED for appropriate evaluation and treatment of symptoms.  Follow-up with your primary care provider for your other medical issues, concerns and or health care needs.     Total Discharge Time:  Greater than 30 minutes.  SignedFransisca Kaufmann NP-C 05/22/2012, 12:49 PM

## 2012-05-22 NOTE — [facility] Suicide Risk Assessment (Signed)
[facility] INPATIENT:  Family/Significant Other Suicide Prevention Education  Suicide Prevention Education:  Education Completed; Futures trader, patient husband has been identified by the patient as the family member/significant other with whom the patient will be residing, and identified as the person(s) who will aid the patient in the event of a mental health crisis (suicidal ideations/suicide attempt).  With written consent from the patient, the family member/significant other has been provided the following suicide prevention education, prior to the and/or following the discharge of the patient.  The suicide prevention education provided includes the following:  Suicide risk factors  Suicide prevention and interventions  National Suicide Hotline telephone number  [facility] assessment telephone number  [facility] Emergency Assistance 911  [facility] and/or Residential Mobile Crisis Unit telephone number  Request made of family/significant other to:  Remove weapons (e.g., guns, rifles, knives), all items previously/currently identified as safety concern.    Remove drugs/medications (over-the-counter, prescriptions, illicit drugs), all items previously/currently identified as a safety concern.  The family member/significant other verbalizes understanding of the suicide prevention education information provided.  The family member/significant other agrees to remove the items of safety concern listed above.  Lindsey Davenport, Lindsey Davenport 05/22/2012, 11:33 AM

## 2012-05-25 NOTE — Progress Notes (Signed)
Patient Discharge Instructions:  After Visit Summary (AVS):   Faxed to:  05/25/12 Discharge Summary Note:   Faxed to:  05/25/12 Psychiatric Admission Assessment Note:   Faxed to:  05/25/12 Suicide Risk Assessment - Discharge Assessment:   Faxed to:  05/25/12 Faxed/Sent to the Next Level Care provider:  05/25/12 Next Level Care Provider Has Access to the EMR, 05/25/12 Faxed to Dr. Emerson Monte @ [phone] Records provided to [facility] via [facility]/Epic access    Jerelene Redden, 05/25/2012, 2:55 PM

## 2012-05-31 ENCOUNTER — Ambulatory Visit (INDEPENDENT_AMBULATORY_CARE_PROVIDER_SITE_OTHER): Payer: BC Managed Care – PPO | Admitting: Psychology

## 2012-05-31 DIAGNOSIS — F319 Bipolar disorder, unspecified: Secondary | ICD-10-CM

## 2012-06-14 ENCOUNTER — Ambulatory Visit (INDEPENDENT_AMBULATORY_CARE_PROVIDER_SITE_OTHER): Payer: BC Managed Care – PPO | Admitting: Family Medicine

## 2012-06-14 ENCOUNTER — Encounter: Payer: Self-pay | Admitting: Family Medicine

## 2012-06-14 ENCOUNTER — Other Ambulatory Visit: Payer: Self-pay | Admitting: Family Medicine

## 2012-06-14 VITALS — BP 112/74 | HR 66 | Ht 64.0 in | Wt 125.0 lb

## 2012-06-14 DIAGNOSIS — N760 Acute vaginitis: Secondary | ICD-10-CM

## 2012-06-14 DIAGNOSIS — N949 Unspecified condition associated with female genital organs and menstrual cycle: Secondary | ICD-10-CM

## 2012-06-14 DIAGNOSIS — R102 Pelvic and perineal pain: Secondary | ICD-10-CM

## 2012-06-14 DIAGNOSIS — B9689 Other specified bacterial agents as the cause of diseases classified elsewhere: Secondary | ICD-10-CM

## 2012-06-14 NOTE — Addendum Note (Signed)
Addended by: Barbara Cower on: 06/14/2012 11:55 AM   Modules accepted: Orders

## 2012-06-14 NOTE — Assessment & Plan Note (Signed)
Unclear etiology, doubt PID, given no discharge, and prev. BTL, will check cultures.  Could be hydrosalpinx after tubal, ovarian cyst/other, or hematometra following ablation.  Will send for U/S and f/u here in 1-2 wks.

## 2012-06-14 NOTE — Patient Instructions (Addendum)
Pelvic Pain Pelvic pain is pain below the belly button and located between your hips. Acute pain may last a few hours or days. Chronic pelvic pain may last weeks and months. The cause may be different for different types of pain. The pain may be dull or sharp, mild or severe and can interfere with your daily activities. Write down and tell your caregiver:   Exactly where the pain is located.  If it comes and goes or is there all the time.  When it happens (with sex, urination, bowel movement, etc.)  If the pain is related to your menstrual period or stress. Your caregiver will take a full history and do a complete physical exam and Pap test. CAUSES   Painful menstrual periods (dysmenorrhea).  Normal ovulation (Mittelschmertz) that occurs in the middle of the menstrual cycle every month.  The pelvic organs get engorged with blood just before the menstrual period (pelvic congestive syndrome).  Scar tissue from an infection or past surgery (pelvic adhesions).  Cancer of the female pelvic organs. When there is pain with cancer, it has been there for a long time.  The lining of the uterus (endometrium) abnormally grows in places like the pelvis and on the pelvic organs (endometriosis).  A form of endometriosis with the lining of the uterus present inside of the muscle tissue of the uterus (adenomyosis).  Fibroid tumor (noncancerous) in the uterus.  Bladder problems such as infection, bladder spasms of the muscle tissue of the bladder.  Intestinal problems (irritable bowel syndrome, colitis, an ulcer or gastrointestinal infection).  Polyps of the cervix or uterus.  Pregnancy in the tube (ectopic pregnancy).  The opening of the cervix is too small for the menstrual blood to flow through it (cervical stenosis).  Physical or sexual abuse (past or present).  Musculo-skeletal problems from poor posture, problems with the vertebrae of the lower back or the uterine pelvic muscles falling  (prolapse).  Psychological problems such as depression or stress.  IUD (intrauterine device) in the uterus. DIAGNOSIS  Tests to make a diagnosis depends on the type, location, severity and what causes the pain to occur. Tests that may be needed include:  Blood tests.  Urine tests  Ultrasound.  X-rays.  CT Scan.  MRI.  Laparoscopy.  Major surgery. TREATMENT  Treatment will depend on the cause of the pain, which includes:  Prescription or over-the-counter pain medication.  Antibiotics.  Birth control pills.  Hormone treatment.  Nerve blocking injections.  Physical therapy.  Antidepressants.  Counseling with a psychiatrist or psychologist.  Minor or major surgery. HOME CARE INSTRUCTIONS   Only take over-the-counter or prescription medicines for pain, discomfort or fever as directed by your caregiver.  Follow your caregiver's advice to treat your pain.  Rest.  Avoid sexual intercourse if it causes the pain.  Apply warm or cold compresses (which ever works best) to the pain area.  Do relaxation exercises such as yoga or meditation.  Try acupuncture.  Avoid stressful situations.  Try group therapy.  If the pain is because of a stomach/intestinal upset, drink clear liquids, eat a bland light food diet until the symptoms go away. SEEK MEDICAL CARE IF:   You need stronger prescription pain medication.  You develop pain with sexual intercourse.  You have pain with urination.  You develop a temperature of 102 F (38.9 C) with the pain.  You are still in pain after 4 hours of taking prescription medication for the pain.  You need depression medication.    Your IUD is causing pain and you want it removed. SEEK IMMEDIATE MEDICAL CARE IF:  You develop very severe pain or tenderness.  You faint, have chills, severe weakness or dehydration.  You develop heavy vaginal bleeding or passing solid tissue.  You develop a temperature of 102 F (38.9 C)  with the pain.  You have blood in the urine.  You are being physically or sexually abused.  You have uncontrolled vomiting and diarrhea.  You are depressed and afraid of harming yourself or someone else. Document Released: 02/26/2004 Document Revised: 04/12/2011 Document Reviewed: 11/23/2007 ExitCare Patient Information 2013 [employer].  

## 2012-06-14 NOTE — Progress Notes (Signed)
  Subjective:    Patient ID: Lindsey Davenport, female    DOB: [birth year], 32 y.o.   MRN: [id number]  Abdominal Pain This is a new problem. The current episode started 1 to 4 weeks ago. The problem has been gradually worsening. The pain is located in the LLQ and RLQ. The pain is mild. The quality of the pain is dull. The abdominal pain radiates to the back and LLQ. Associated symptoms include nausea. Pertinent negatives include no dysuria or fever. Nothing aggravates the pain. Treatments tried: advil, heat. The treatment provided no relief.  Is s/p BTL and Endometrial ablation in 2013.  No cycle since that time.  Pain is worse on left, aching, and diffuse, feels like UTI, except no burning with urination.     Review of Systems  Constitutional: Negative for fever and chills.  Gastrointestinal: Positive for nausea and abdominal pain.  Genitourinary: Positive for pelvic pain. Negative for dysuria, urgency and vaginal discharge.       Objective:   Physical Exam  Vitals reviewed. Constitutional: She appears well-developed and well-nourished.  Cardiovascular: Normal rate.   Pulmonary/Chest: Effort normal.  Abdominal: Soft. There is tenderness. There is no rebound.  Genitourinary:  NEFG, BUS is WNL.  Vagina is pink and ruggated, cervix is without lesion, there is no significant vaginal discharge.  The uterus is small, and tender, adnexa are tender, but don't feel significantly enlarged.          Assessment & Plan:

## 2012-06-16 LAB — WET PREP, GENITAL
Trich, Wet Prep: NONE SEEN
Yeast Wet Prep HPF POC: NONE SEEN

## 2012-06-16 MED ORDER — METRONIDAZOLE 500 MG PO TABS: 500.0000 mg | ORAL_TABLET | Freq: Two times a day (BID) | ORAL | Status: DC

## 2012-06-16 NOTE — Addendum Note (Signed)
Addended by: Barbara Cower on: 06/16/2012 11:25 AM   Modules accepted: Orders

## 2012-06-20 ENCOUNTER — Encounter: Payer: Self-pay | Admitting: Radiology

## 2012-06-20 ENCOUNTER — Ambulatory Visit (INDEPENDENT_AMBULATORY_CARE_PROVIDER_SITE_OTHER): Payer: BC Managed Care – PPO | Admitting: Family Medicine

## 2012-06-20 ENCOUNTER — Encounter: Payer: Self-pay | Admitting: Family Medicine

## 2012-06-20 ENCOUNTER — Other Ambulatory Visit: Payer: Self-pay | Admitting: Family Medicine

## 2012-06-20 VITALS — BP 122/64 | HR 76 | Temp 98.2°F | Ht 63.0 in | Wt 128.0 lb

## 2012-06-20 DIAGNOSIS — T148 Other injury of unspecified body region: Secondary | ICD-10-CM

## 2012-06-20 DIAGNOSIS — R102 Pelvic and perineal pain: Secondary | ICD-10-CM

## 2012-06-20 DIAGNOSIS — F1421 Cocaine dependence, in remission: Secondary | ICD-10-CM | POA: Insufficient documentation

## 2012-06-20 DIAGNOSIS — W57XXXA Bitten or stung by nonvenomous insect and other nonvenomous arthropods, initial encounter: Secondary | ICD-10-CM | POA: Insufficient documentation

## 2012-06-20 DIAGNOSIS — S0990XS Unspecified injury of head, sequela: Secondary | ICD-10-CM

## 2012-06-20 DIAGNOSIS — F3130 Bipolar disorder, current episode depressed, mild or moderate severity, unspecified: Secondary | ICD-10-CM

## 2012-06-20 DIAGNOSIS — E039 Hypothyroidism, unspecified: Secondary | ICD-10-CM | POA: Insufficient documentation

## 2012-06-20 DIAGNOSIS — M79622 Pain in left upper arm: Secondary | ICD-10-CM

## 2012-06-20 DIAGNOSIS — T788XXS Other adverse effects, not elsewhere classified, sequela: Secondary | ICD-10-CM

## 2012-06-20 DIAGNOSIS — S0990XA Unspecified injury of head, initial encounter: Secondary | ICD-10-CM | POA: Insufficient documentation

## 2012-06-20 DIAGNOSIS — F1911 Other psychoactive substance abuse, in remission: Secondary | ICD-10-CM

## 2012-06-20 DIAGNOSIS — M79609 Pain in unspecified limb: Secondary | ICD-10-CM

## 2012-06-20 MED ORDER — LEVOTHYROXINE SODIUM 50 MCG PO TABS: 50.0000 ug | ORAL_TABLET | Freq: Every day | ORAL | Status: DC

## 2012-06-20 NOTE — Assessment & Plan Note (Signed)
refilled today. Lab Results  Component Value Date   TSH 0.829 05/18/2012

## 2012-06-20 NOTE — Patient Instructions (Addendum)
For tick bite - watch for fever, chills, nausea, new rashes, worsening headaches or joint pains. If this happens please let me know. Good to meet you today, call us with questions Return as needed or in 6 months for follow up and physical (prior fasting for blood work).

## 2012-06-20 NOTE — Assessment & Plan Note (Signed)
Cleaned with EtOH and tried to remove with forceps, unsuccessfully.  Finally able to remove with 18g needle. Discussed red flags to contact us for antibiotic (see pt instructions)

## 2012-06-20 NOTE — Assessment & Plan Note (Signed)
Unclear etiology.  Overall normal exam.  Will continue to monitor.  Use tylenol prn pain.

## 2012-06-20 NOTE — Assessment & Plan Note (Signed)
Followed by psych
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
## 2012-06-21 ENCOUNTER — Ambulatory Visit (HOSPITAL_COMMUNITY)
Admission: RE | Admit: 2012-06-21 | Discharge: 2012-06-21 | Disposition: A | Discharge status: Home / Self Care | Payer: BC Managed Care – PPO | Source: Ambulatory Visit | Attending: Family Medicine | Admitting: Family Medicine

## 2012-06-21 DIAGNOSIS — R102 Pelvic and perineal pain: Secondary | ICD-10-CM

## 2012-06-21 DIAGNOSIS — N949 Unspecified condition associated with female genital organs and menstrual cycle: Secondary | ICD-10-CM | POA: Insufficient documentation

## 2012-06-27 ENCOUNTER — Encounter: Payer: Self-pay | Admitting: Family Medicine

## 2012-06-27 ENCOUNTER — Ambulatory Visit (INDEPENDENT_AMBULATORY_CARE_PROVIDER_SITE_OTHER): Payer: BC Managed Care – PPO | Admitting: Family Medicine

## 2012-06-27 VITALS — BP 123/87 | HR 71 | Ht 63.0 in | Wt 127.0 lb

## 2012-06-27 DIAGNOSIS — R102 Pelvic and perineal pain: Secondary | ICD-10-CM

## 2012-06-27 DIAGNOSIS — N949 Unspecified condition associated with female genital organs and menstrual cycle: Secondary | ICD-10-CM

## 2012-06-27 MED ORDER — DICLOFENAC SODIUM 75 MG PO TBEC: 75.0000 mg | DELAYED_RELEASE_TABLET | Freq: Two times a day (BID) | ORAL | Status: DC

## 2012-06-27 MED ORDER — NORETHINDRONE ACETATE 5 MG PO TABS: 5.0000 mg | ORAL_TABLET | Freq: Every day | ORAL | Status: DC

## 2012-06-27 NOTE — Assessment & Plan Note (Signed)
?   Endometriosis as cause.  Difficult situation related to bipolar and intolerance of medication, possible need for definitive therapy, need for post surgical HRT and the ramifications of her bipolar and inability to tolerate hormones.  Will repeat her u/s in 4 wks and trial of Aygestin during that time.  To stop Ibuprofen and trial of Diclofenac.  Tylenol regular strength prn.

## 2012-06-27 NOTE — Progress Notes (Signed)
Still having pelvic pain radiating to her legs.  Here to go over ultrasound results.

## 2012-06-27 NOTE — Progress Notes (Signed)
  Subjective:    Patient ID: Lindsey Davenport, female    DOB: 30-May-1980, 32 y.o.   MRN: [id number]  HPI  Here with chronic pelvic pain.  S/p endometrial ablation for bleeding.  Worsening pain evaluated and u/s reveals enlarged right ovary, possible endometriosis vs. Complex cyst.  Has long psych hx and has not had success with Mirena or combination OC's and her bipolar disorder.  Just getting stable from that standpoint.  After a lengthy discussion of options, she was ready for hysterectomy and single oophorectomy and leaving one ovary, however, after discussion with her partner, she decided to forego that and try medication.  Continues to have lower abdominal pain left>right with radiation to back and down both thighs.  Review of Systems  Constitutional: Negative for fever and chills.  Gastrointestinal: Positive for abdominal pain. Negative for nausea and vomiting.  Genitourinary: Positive for dyspareunia. Negative for dysuria and menstrual problem.       Objective:   Physical Exam  Vitals reviewed. Constitutional: She appears well-developed and well-nourished. No distress.  Eyes: No scleral icterus.  Neck: Neck supple.  Cardiovascular: Normal rate and regular rhythm.   Pulmonary/Chest: Effort normal.  Abdominal: Soft. Distention: low. There is tenderness. There is no rebound and no guarding.          Assessment & Plan:

## 2012-06-27 NOTE — Patient Instructions (Addendum)
Pelvic Pain Pelvic pain is pain below the belly button and located between your hips. Acute pain may last a few hours or days. Chronic pelvic pain may last weeks and months. The cause may be different for different types of pain. The pain may be dull or sharp, mild or severe and can interfere with your daily activities. Write down and tell your caregiver:   Exactly where the pain is located.  If it comes and goes or is there all the time.  When it happens (with sex, urination, bowel movement, etc.)  If the pain is related to your menstrual period or stress. Your caregiver will take a full history and do a complete physical exam and Pap test. CAUSES   Painful menstrual periods (dysmenorrhea).  Normal ovulation (Mittelschmertz) that occurs in the middle of the menstrual cycle every month.  The pelvic organs get engorged with blood just before the menstrual period (pelvic congestive syndrome).  Scar tissue from an infection or past surgery (pelvic adhesions).  Cancer of the female pelvic organs. When there is pain with cancer, it has been there for a long time.  The lining of the uterus (endometrium) abnormally grows in places like the pelvis and on the pelvic organs (endometriosis).  A form of endometriosis with the lining of the uterus present inside of the muscle tissue of the uterus (adenomyosis).  Fibroid tumor (noncancerous) in the uterus.  Bladder problems such as infection, bladder spasms of the muscle tissue of the bladder.  Intestinal problems (irritable bowel syndrome, colitis, an ulcer or gastrointestinal infection).  Polyps of the cervix or uterus.  Pregnancy in the tube (ectopic pregnancy).  The opening of the cervix is too small for the menstrual blood to flow through it (cervical stenosis).  Physical or sexual abuse (past or present).  Musculo-skeletal problems from poor posture, problems with the vertebrae of the lower back or the uterine pelvic muscles falling  (prolapse).  Psychological problems such as depression or stress.  IUD (intrauterine device) in the uterus. DIAGNOSIS  Tests to make a diagnosis depends on the type, location, severity and what causes the pain to occur. Tests that may be needed include:  Blood tests.  Urine tests  Ultrasound.  X-rays.  CT Scan.  MRI.  Laparoscopy.  Major surgery. TREATMENT  Treatment will depend on the cause of the pain, which includes:  Prescription or over-the-counter pain medication.  Antibiotics.  Birth control pills.  Hormone treatment.  Nerve blocking injections.  Physical therapy.  Antidepressants.  Counseling with a psychiatrist or psychologist.  Minor or major surgery. HOME CARE INSTRUCTIONS   Only take over-the-counter or prescription medicines for pain, discomfort or fever as directed by your caregiver.  Follow your caregiver's advice to treat your pain.  Rest.  Avoid sexual intercourse if it causes the pain.  Apply warm or cold compresses (which ever works best) to the pain area.  Do relaxation exercises such as yoga or meditation.  Try acupuncture.  Avoid stressful situations.  Try group therapy.  If the pain is because of a stomach/intestinal upset, drink clear liquids, eat a bland light food diet until the symptoms go away. SEEK MEDICAL CARE IF:   You need stronger prescription pain medication.  You develop pain with sexual intercourse.  You have pain with urination.  You develop a temperature of 102 F (38.9 C) with the pain.  You are still in pain after 4 hours of taking prescription medication for the pain.  You need depression medication.    Your IUD is causing pain and you want it removed. SEEK IMMEDIATE MEDICAL CARE IF:  You develop very severe pain or tenderness.  You faint, have chills, severe weakness or dehydration.  You develop heavy vaginal bleeding or passing solid tissue.  You develop a temperature of 102 F (38.9 C)  with the pain.  You have blood in the urine.  You are being physically or sexually abused.  You have uncontrolled vomiting and diarrhea.  You are depressed and afraid of harming yourself or someone else. Document Released: 02/26/2004 Document Revised: 04/12/2011 Document Reviewed: 11/23/2007 [facility] Patient Information 2013 [place], [place]. Endometriosis Endometriosis is a disease that occurs when the endometrium (lining of the uterus) is misplaced outside of its normal location. It may occur in many locations close to the uterus (womb), but commonly on the ovaries, fallopian tubes, vagina (birth canal) and bowel located close to the uterus. Because the uterus sloughs (expels) its lining every month (menses), there is bleeding whereever the endometrial tissue is located. SYMPTOMS  Often there are no symptoms. However, because blood is irritating to tissues not normally exposed to it, when symptoms occur they vary with the location of the misplaced endometrium. Symptoms often include back and abdominal pain. Periods may be heavier and intercourse may be painful. Infertility may be present. You may have all of these symptoms at one time or another or you may have months with no symptoms at all. Although the symptoms occur mainly during menses, they can occur mid-cycle as well, and usually terminate with menopause. DIAGNOSIS  Your caregiver may recommend a blood test and urine test (urinalysis) to help rule out other conditions. Another common test is ultrasound, a painless procedure that uses sound waves to make a sonogram "picture" of abnormal tissue that could be endometriosis. If your bowel movements are painful around your periods, your caregiver may advise a barium enema (an X-ray of the lower bowel), to try to find the source of your pain. This is sometimes confirmed by laparoscopy. Laparoscopy is a procedure where your caregiver looks into your abdomen with a laparoscope (a small pencil sized  telescope). Your caregiver may take a tiny piece of tissue (biopsy) from any abnormal tissue to confirm or document your problem. These tissues are sent to the lab and a pathologist looks at them under the microscope to give a microscopic diagnosis. TREATMENT  Once the diagnosis is made, it can be treated by destruction of the misplaced endometrial tissue using heat (diathermy), laser, cutting (excision), or chemical means. It may also be treated with hormonal therapy. When using hormonal therapy menses are eliminated, therefore eliminating the monthly exposure to blood by the misplaced endometrial tissue. Only in severe cases is it necessary to perform a hysterectomy with removal of the tubes, uterus and ovaries. HOME CARE INSTRUCTIONS   Only take over-the-counter or prescription medicines for pain, discomfort, or fever as directed by your caregiver.  Avoid activities that produce pain, including a physical sexual relationship.  Do not take aspirin as this may increase bleeding when not on hormonal therapy.  See your caregiver for pain or problems not controlled with treatment. SEEK IMMEDIATE MEDICAL CARE IF:   Your pain is severe and is not responding to pain medicine.  You develop severe nausea and vomiting, or you cannot keep foods down.  Your pain localizes to the right lower part of your abdomen (possible appendicitis).  You have swelling or increasing pain in the abdomen.  You have a fever.  You see  blood in your stool. MAKE SURE YOU:   Understand these instructions.  Will watch your condition.  Will get help right away if you are not doing well or get worse. Document Released: 01/16/2000 Document Revised: 04/12/2011 Document Reviewed: 09/06/2007 [facility] Patient Information 2014 [place], [place].

## 2012-06-28 ENCOUNTER — Ambulatory Visit (INDEPENDENT_AMBULATORY_CARE_PROVIDER_SITE_OTHER): Payer: BC Managed Care – PPO | Admitting: Psychology

## 2012-06-28 DIAGNOSIS — F319 Bipolar disorder, unspecified: Secondary | ICD-10-CM

## 2012-07-04 ENCOUNTER — Telehealth: Payer: Self-pay | Admitting: *Deleted

## 2012-07-04 DIAGNOSIS — N83209 Unspecified ovarian cyst, unspecified side: Secondary | ICD-10-CM

## 2012-07-04 NOTE — Telephone Encounter (Signed)
Patient attempted to make ultrasound appointment and radiology told her the orders were not in the system.  I replaced the orders and see that they are in the system from the 26th but I put them in again so radiology could find them and schedule her appointment.  She will follow up with Dr. Shawnie Pons after the ultrasound is complete.

## 2012-07-10 ENCOUNTER — Encounter: Payer: Self-pay | Admitting: Family Medicine

## 2012-07-11 ENCOUNTER — Ambulatory Visit (HOSPITAL_COMMUNITY)
Admission: RE | Admit: 2012-07-11 | Discharge: 2012-07-11 | Disposition: A | Discharge status: Home / Self Care | Payer: BC Managed Care – PPO | Source: Ambulatory Visit | Attending: Family Medicine | Admitting: Family Medicine

## 2012-07-11 DIAGNOSIS — N83209 Unspecified ovarian cyst, unspecified side: Secondary | ICD-10-CM | POA: Insufficient documentation

## 2012-07-11 DIAGNOSIS — D259 Leiomyoma of uterus, unspecified: Secondary | ICD-10-CM | POA: Insufficient documentation

## 2012-07-12 ENCOUNTER — Ambulatory Visit (INDEPENDENT_AMBULATORY_CARE_PROVIDER_SITE_OTHER): Payer: BC Managed Care – PPO | Admitting: Psychology

## 2012-07-12 DIAGNOSIS — F319 Bipolar disorder, unspecified: Secondary | ICD-10-CM

## 2012-07-25 ENCOUNTER — Encounter (HOSPITAL_COMMUNITY): Payer: Self-pay | Admitting: Emergency Medicine

## 2012-07-25 ENCOUNTER — Emergency Department (HOSPITAL_COMMUNITY)
Admission: EM | Admit: 2012-07-25 | Discharge: 2012-07-25 | Disposition: A | Discharge status: Home / Self Care | Payer: BC Managed Care – PPO | Source: Home / Self Care

## 2012-07-25 DIAGNOSIS — T887XXA Unspecified adverse effect of drug or medicament, initial encounter: Secondary | ICD-10-CM

## 2012-07-25 DIAGNOSIS — T50905A Adverse effect of unspecified drugs, medicaments and biological substances, initial encounter: Secondary | ICD-10-CM

## 2012-07-25 LAB — POCT I-STAT, CHEM 8
Creatinine, Ser: 0.8 mg/dL (ref 0.50–1.10)
HCT: 44 % (ref 36.0–46.0)
Hemoglobin: 15 g/dL (ref 12.0–15.0)
Potassium: 4.1 mEq/L (ref 3.5–5.1)
Sodium: 143 mEq/L (ref 135–145)
TCO2: 27 mmol/L (ref 0–100)

## 2012-07-25 MED ORDER — LIDOCAINE VISCOUS 2 % MT SOLN: 15.0000 mL | OROMUCOSAL | Status: DC | PRN
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
## 2012-07-25 NOTE — ED Notes (Signed)
Patient reports soreness and swelling of tongue, painful to swallow and reports lymph nodes are swollen and neck is sore.  Called her doctor about medicine told these were side effects of medicine.  Patient reports increase dose of medicine last Thursday.  Patient also has a rash on both thighs, posterior

## 2012-07-26 ENCOUNTER — Ambulatory Visit (INDEPENDENT_AMBULATORY_CARE_PROVIDER_SITE_OTHER): Payer: BC Managed Care – PPO | Admitting: Psychology

## 2012-07-26 DIAGNOSIS — F319 Bipolar disorder, unspecified: Secondary | ICD-10-CM

## 2012-07-28 LAB — CULTURE, GROUP A STREP

## 2012-08-08 NOTE — ED Provider Notes (Signed)
Medical screening examination/treatment/procedure(s) were performed by resident physician or non-physician practitioner and as supervising physician I was immediately available for consultation/collaboration.   Barkley Bruns MD.   Linna Hoff, MD 08/08/12 1100

## 2012-08-09 ENCOUNTER — Ambulatory Visit: Payer: BC Managed Care – PPO | Admitting: Psychology

## 2012-08-15 ENCOUNTER — Ambulatory Visit (INDEPENDENT_AMBULATORY_CARE_PROVIDER_SITE_OTHER): Payer: BC Managed Care – PPO | Admitting: Family Medicine

## 2012-08-15 ENCOUNTER — Encounter: Payer: Self-pay | Admitting: Family Medicine

## 2012-08-15 VITALS — BP 116/82 | HR 72 | Temp 98.1°F | Wt 135.0 lb

## 2012-08-15 DIAGNOSIS — M79609 Pain in unspecified limb: Secondary | ICD-10-CM

## 2012-08-15 DIAGNOSIS — Z5181 Encounter for therapeutic drug level monitoring: Secondary | ICD-10-CM

## 2012-08-15 DIAGNOSIS — E039 Hypothyroidism, unspecified: Secondary | ICD-10-CM

## 2012-08-15 DIAGNOSIS — M79605 Pain in left leg: Secondary | ICD-10-CM | POA: Insufficient documentation

## 2012-08-15 DIAGNOSIS — F3130 Bipolar disorder, current episode depressed, mild or moderate severity, unspecified: Secondary | ICD-10-CM

## 2012-08-15 DIAGNOSIS — M79604 Pain in right leg: Secondary | ICD-10-CM

## 2012-08-15 LAB — CBC WITH DIFFERENTIAL/PLATELET
Basophils Relative: 0.1 % (ref 0.0–3.0)
Eosinophils Absolute: 0.3 10*3/uL (ref 0.0–0.7)
Eosinophils Relative: 4.9 % (ref 0.0–5.0)
HCT: 44.8 % (ref 36.0–46.0)
Lymphs Abs: 3.2 10*3/uL (ref 0.7–4.0)
MCHC: 33.8 g/dL (ref 30.0–36.0)
MCV: 95.1 fl (ref 78.0–100.0)
Monocytes Absolute: 0.4 10*3/uL (ref 0.1–1.0)
Platelets: 188 10*3/uL (ref 150.0–400.0)
WBC: 7.1 10*3/uL (ref 4.5–10.5)

## 2012-08-15 LAB — COMPREHENSIVE METABOLIC PANEL
AST: 16 U/L (ref 0–37)
Alkaline Phosphatase: 59 U/L (ref 39–117)
BUN: 9 mg/dL (ref 6–23)
Glucose, Bld: 85 mg/dL (ref 70–99)
Sodium: 141 mEq/L (ref 135–145)
Total Bilirubin: 0.3 mg/dL (ref 0.3–1.2)

## 2012-08-15 LAB — MAGNESIUM: Magnesium: 1.8 mg/dL (ref 1.5–2.5)

## 2012-08-15 NOTE — Patient Instructions (Addendum)
Sign release of records for [facility] (recent blood work). Blood work today. We will call you with results and plan.

## 2012-08-15 NOTE — Assessment & Plan Note (Addendum)
Unclear etiology, benign exam. In h/o RLS, check blood work today, consider requip if unrevealing.
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
## 2012-08-15 NOTE — Assessment & Plan Note (Signed)
Check blood work today. Followed by psych. On cogentin, latuda, and depakote (recent start)

## 2012-08-17 ENCOUNTER — Telehealth: Payer: Self-pay

## 2012-08-17 NOTE — Telephone Encounter (Signed)
Pt requesting status of med for restless leg;pt left note on lab result note requesting med sent to [facility] for restless leg. Pt will ck at [facility] on 08/18/12.

## 2012-08-18 ENCOUNTER — Telehealth: Payer: Self-pay | Admitting: *Deleted

## 2012-08-18 NOTE — Telephone Encounter (Signed)
Patient requesting med for RLS. Dr. Reece Agar never sent anything in for her. She's been waiting for 2 days (See phone note). Is there anything you can send in for her?

## 2012-08-18 NOTE — Telephone Encounter (Signed)
Lindsey Davenport Ems came into the office today asking about a prescription for her restless leg that should have been called in to [facility] pharmacy a few days ago. After talking with you, I told Lindsey Davenport that you would send a note to Dr. Para March to see if you could send something in, but we would have to wait for him to respond, she then informed me to cancel all of her future appointments and that she would find another primary doctor. She was very aggravated and left the office.

## 2012-08-19 MED ORDER — ROPINIROLE HCL 0.25 MG PO TABS: 0.2500 mg | ORAL_TABLET | Freq: Every day | ORAL | Status: DC

## 2012-08-19 NOTE — Telephone Encounter (Signed)
I see pt is upset about med not called in.  I'm sorry - I was out of town on Friday. plz notify I've sent in med to her pharmacy to try for restless legs.  Take one at night, if tolerated well then after 4 days may increase to 2 at night. But I also do want her to get psychiatry opinion on this med for restless legs along with her other psych meds. Also try to avoid hydroxyzine as this med can sometimes worsen restless legs

## 2012-08-20 NOTE — Telephone Encounter (Signed)
This is a late entry.  I was in the midst of reviewing her chart to potentially send the rx when the addendum was added.  I didn't send the rx.  Will send to PCP as FYI.

## 2012-08-21 NOTE — Telephone Encounter (Signed)
I called patient to apologize about med not being sent in last week.   Pt hung up on me. I have called pharmacy to cancel requip prescription.

## 2012-08-23 ENCOUNTER — Ambulatory Visit (INDEPENDENT_AMBULATORY_CARE_PROVIDER_SITE_OTHER): Payer: BC Managed Care – PPO | Admitting: Psychology

## 2012-08-23 ENCOUNTER — Ambulatory Visit: Payer: BC Managed Care – PPO | Admitting: Psychology

## 2012-08-23 DIAGNOSIS — F319 Bipolar disorder, unspecified: Secondary | ICD-10-CM

## 2012-09-06 ENCOUNTER — Ambulatory Visit: Payer: BC Managed Care – PPO | Admitting: Psychology

## 2012-09-13 ENCOUNTER — Ambulatory Visit (INDEPENDENT_AMBULATORY_CARE_PROVIDER_SITE_OTHER): Payer: BC Managed Care – PPO | Admitting: Psychology

## 2012-09-13 DIAGNOSIS — F319 Bipolar disorder, unspecified: Secondary | ICD-10-CM

## 2012-09-20 ENCOUNTER — Ambulatory Visit: Payer: BC Managed Care – PPO | Admitting: Psychology

## 2012-10-20 ENCOUNTER — Encounter: Payer: Self-pay | Admitting: Obstetrics & Gynecology

## 2012-10-20 ENCOUNTER — Ambulatory Visit (INDEPENDENT_AMBULATORY_CARE_PROVIDER_SITE_OTHER): Payer: BC Managed Care – PPO | Admitting: Obstetrics & Gynecology

## 2012-10-20 VITALS — BP 106/77 | HR 77 | Ht 63.0 in | Wt 137.0 lb

## 2012-10-20 DIAGNOSIS — N390 Urinary tract infection, site not specified: Secondary | ICD-10-CM

## 2012-10-20 DIAGNOSIS — R102 Pelvic and perineal pain: Secondary | ICD-10-CM

## 2012-10-20 DIAGNOSIS — N949 Unspecified condition associated with female genital organs and menstrual cycle: Secondary | ICD-10-CM

## 2012-10-20 DIAGNOSIS — Z23 Encounter for immunization: Secondary | ICD-10-CM

## 2012-10-20 LAB — POCT URINALYSIS DIPSTICK
Bilirubin, UA: NEGATIVE
Nitrite, UA: NEGATIVE
Urobilinogen, UA: NEGATIVE
pH, UA: 6

## 2012-10-20 MED ORDER — MELOXICAM 7.5 MG PO TABS: 7.5000 mg | ORAL_TABLET | Freq: Every day | ORAL | Status: DC

## 2012-10-20 MED ORDER — TRAMADOL HCL 50 MG PO TABS: 50.0000 mg | ORAL_TABLET | Freq: Four times a day (QID) | ORAL | Status: DC | PRN

## 2012-10-20 NOTE — Progress Notes (Signed)
GYNECOLOGY CLINIC ENCOUNTER NOTE  History:  32 y.o. [id number] (C/S x 4) here today for evaluation of pelvic pain x 2 months.  Reports being treated for UTI twice during this time with Ciprofloxacin.  Described hematuria vs scant vaginal bleeding, unsure of source of spotting.  Of note, patient has been seen multiple times for this complaint. She has a right ovarian cyst in 06/2012 than resolved on subsequent scan.  Denies back pain, fevers, N/V or other symptoms. Pain is not alleviated on ibuprofen or Diclofenac.  The following portions of the patient's history were reviewed and updated as appropriate: allergies, current medications, past family history, past medical history, past social history, past surgical history and problem list.  Review of Systems:  Pertinent items are noted in HPI.  Objective:  Physical Exam BP 106/77  Pulse 77  Ht 5\' 3"  (1.6 m)  Wt 137 lb (62.143 kg)  BMI 24.27 kg/m2 Gen: NAD Abd: Soft, nondistended, diffuse lower abdominal tenderness especially in LLQ, no rebound or guarding. Pelvic: Normal appearing external genitalia; normal appearing vaginal mucosa and cervix.  Normal discharge, samples obtained for wet prep and GC/Chlam cultures.  Small uterus, no other palpable masses, no uterine or cervical tenderness.  Moderate bilateral adnexal tenderness, L>>R, left sided tenderness also extends upward to the LLQ lateral to the umbilicus.   Results for orders placed in visit on 10/20/12 (from the past 24 hour(s))  POCT URINALYSIS DIPSTICK     Status: Abnormal   Collection Time    10/20/12 10:54 AM      Result Value Range   Color, UA yellow     Clarity, UA clear     Glucose, UA neg     Bilirubin, UA neg     Ketones, UA neg     Spec Grav, UA >=1.030     Blood, UA neg     pH, UA 6.0     Protein, UA neg     Urobilinogen, UA negative     Nitrite, UA neg     Leukocytes, UA Trace      Assessment & Plan:  Pelvic pain of unknown etiology.  UA neg.  Urine and pelvic  cultures sent, will follow up results and manage accordingly.  Pelvic ultrasound ordered. Meloxicam and Tramadol ordered prn pain. Pain precautions reviewed. If pain persists, consider diagnostic laparoscopy. Patient may have endometriosis, adhesive disease from her four cesarean sections or other pelvic etiology for her chronic pain.

## 2012-10-20 NOTE — Patient Instructions (Addendum)
Pelvic Pain Pelvic pain is pain below the belly button and located between your hips. Acute pain may last a few hours or days. Chronic pelvic pain may last weeks and months. The cause may be different for different types of pain. The pain may be dull or sharp, mild or severe and can interfere with your daily activities. Write down and tell your caregiver:   Exactly where the pain is located.  If it comes and goes or is there all the time.  When it happens (with sex, urination, bowel movement, etc.)  If the pain is related to your menstrual period or stress. Your caregiver will take a full history and do a complete physical exam and Pap test. CAUSES   Painful menstrual periods (dysmenorrhea).  Normal ovulation (Mittelschmertz) that occurs in the middle of the menstrual cycle every month.  The pelvic organs get engorged with blood just before the menstrual period (pelvic congestive syndrome).  Scar tissue from an infection or past surgery (pelvic adhesions).  Cancer of the female pelvic organs. When there is pain with cancer, it has been there for a long time.  The lining of the uterus (endometrium) abnormally grows in places like the pelvis and on the pelvic organs (endometriosis).  A form of endometriosis with the lining of the uterus present inside of the muscle tissue of the uterus (adenomyosis).  Fibroid tumor (noncancerous) in the uterus.  Bladder problems such as infection, bladder spasms of the muscle tissue of the bladder.  Intestinal problems (irritable bowel syndrome, colitis, an ulcer or gastrointestinal infection).  Polyps of the cervix or uterus.  Pregnancy in the tube (ectopic pregnancy).  The opening of the cervix is too small for the menstrual blood to flow through it (cervical stenosis).  Physical or sexual abuse (past or present).  Musculo-skeletal problems from poor posture, problems with the vertebrae of the lower back or the uterine pelvic muscles falling  (prolapse).  Psychological problems such as depression or stress.  IUD (intrauterine device) in the uterus. DIAGNOSIS  Tests to make a diagnosis depends on the type, location, severity and what causes the pain to occur. Tests that may be needed include:  Blood tests.  Urine tests  Ultrasound.  X-rays.  CT Scan.  MRI.  Laparoscopy.  Major surgery. TREATMENT  Treatment will depend on the cause of the pain, which includes:  Prescription or over-the-counter pain medication.  Antibiotics.  Birth control pills.  Hormone treatment.  Nerve blocking injections.  Physical therapy.  Antidepressants.  Counseling with a psychiatrist or psychologist.  Minor or major surgery. HOME CARE INSTRUCTIONS   Only take over-the-counter or prescription medicines for pain, discomfort or fever as directed by your caregiver.  Follow your caregiver's advice to treat your pain.  Rest.  Avoid sexual intercourse if it causes the pain.  Apply warm or cold compresses (which ever works best) to the pain area.  Do relaxation exercises such as yoga or meditation.  Try acupuncture.  Avoid stressful situations.  Try group therapy.  If the pain is because of a stomach/intestinal upset, drink clear liquids, eat a bland light food diet until the symptoms go away. SEEK MEDICAL CARE IF:   You need stronger prescription pain medication.  You develop pain with sexual intercourse.  You have pain with urination.  You develop a temperature of 102 F (38.9 C) with the pain.  You are still in pain after 4 hours of taking prescription medication for the pain.  You need depression medication.    Your IUD is causing pain and you want it removed. SEEK IMMEDIATE MEDICAL CARE IF:  You develop very severe pain or tenderness.  You faint, have chills, severe weakness or dehydration.  You develop heavy vaginal bleeding or passing solid tissue.  You develop a temperature of 102 F (38.9 C)  with the pain.  You have blood in the urine.  You are being physically or sexually abused.  You have uncontrolled vomiting and diarrhea.  You are depressed and afraid of harming yourself or someone else. Document Released: 02/26/2004 Document Revised: 04/12/2011 Document Reviewed: 11/23/2007 [facility] Patient Information 2013 [place], [place].   Diagnostic Laparoscopy Laparoscopy is a surgical procedure. It is used to diagnose and treat diseases inside the belly(abdomen). It is usually a brief, common, and relatively simple procedure. The laparoscopeis a thin, lighted, pencil-sized instrument. It is like a telescope. It is inserted into your abdomen through a small cut (incision). Your caregiver can look at the organs inside your body through this instrument. He or she can see if there is anything abnormal. Laparoscopy can be done either in a hospital or outpatient clinic. You may be given a mild sedative to help you relax before the procedure. Once in the operating room, you will be given a drug to make you sleep (general anesthesia). Laparoscopy usually lasts less than 1 hour. After the procedure, you will be monitored in a recovery area until you are stable and doing well. Once you are home, it will take 2 to 3 days to fully recover. RISKS AND COMPLICATIONS  Laparoscopy has relatively few risks. Your caregiver will discuss the risks with you before the procedure. Some problems that can occur include:  Infection.  Bleeding.  Damage to other organs.  Anesthetic side effects. PROCEDURE Once you receive anesthesia, your surgeon inflates the abdomen with a harmless gas (carbon dioxide). This makes the organs easier to see. The laparoscope is inserted into the abdomen through a small incision. This allows your surgeon to see into the abdomen. Other small instruments are also inserted into the abdomen through other small openings. Many surgeons attach a video camera to the laparoscope to  enlarge the view. During a diagnostic laparoscopy, the surgeon may be looking for inflammation, infection, or cancer. Your surgeon may take tissue samples(biopsies). The samples are sent to a specialist in looking at cells and tissue samples (pathologist). The pathologist examines them under a microscope. Biopsies can help to diagnose or confirm a disease. AFTER THE PROCEDURE   The gas is released from inside the abdomen.  The incisions are closed with stitches (sutures). Because these incisions are small (usually less than 1/2 inch), there is usually minimal discomfort after the procedure. There may be some mild discomfort in the throat. This is from the tube placed in the throat while you were sleeping. You may have some mild abdominal discomfort. There may also be discomfort from the instrument placement incisions in the abdomen.  The recovery time is shortened as long as there are no complications.  You will rest in a recovery room until stable and doing well. As long as there are no complications, you may be allowed to go home. FINDING OUT THE RESULTS OF YOUR TEST Not all test results are available during your visit. If your test results are not back during the visit, make an appointment with your caregiver to find out the results. Do not assume everything is normal if you have not heard from your caregiver or the medical facility. It is  important for you to follow up on all of your test results. HOME CARE INSTRUCTIONS   Take all medicines as directed.  Only take over-the-counter or prescription medicines for pain, discomfort, or fever as directed by your caregiver.  Resume daily activities as directed.  Showers are preferred over baths.  You may resume sexual activities in 1 week or as directed.  Do not drive while taking narcotics. SEEK MEDICAL CARE IF:   There is increasing abdominal pain.  There is new pain in the shoulders (shoulder strap areas).  You feel lightheaded or  faint.  You have the chills.  You or your child has an oral temperature above 102 F (38.9 C).  There is pus-like (purulent) drainage from any of the wounds.  You are unable to pass gas or have a bowel movement.  You feel sick to your stomach (nauseous) or throw up (vomit). MAKE SURE YOU:   Understand these instructions.  Will watch your condition.  Will get help right away if you are not doing well or get worse. Document Released: 04/26/2000 Document Revised: 04/12/2011 Document Reviewed: 01/18/2007 [facility] Patient Information 2014 [place], [place].   Thank you for enrolling in MyChart. Please follow the instructions below to securely access your online medical record. MyChart allows you to send messages to your doctor, view your test results, manage appointments, and more.   How Do I Sign Up? 1. In your Internet browser, go to [employer] and enter [email or web address]. 2. Click on the Sign Up Now link in the Sign In box. You will see the New Member Sign Up page. 3. Enter your MyChart Access Code exactly as it appears below. You will not need to use this code after you've completed the sign-up process. If you do not sign up before the expiration date, you must request a new code. MyChart Access Code: [email or web address] Expires: 11/28/2012 12:53 PM  4. Enter your Social Security Number ([id number]) and Date of Birth (mm/dd/yyyy) as indicated and click Submit. You will be taken to the next sign-up page. 5. Create a MyChart ID. This will be your MyChart login ID and cannot be changed, so think of one that is secure and easy to remember. 6. Create a MyChart password. You can change your password at any time. 7. Enter your Password Reset Question and Answer. This can be used at a later time if you forget your password.  8. Enter your e-mail address. You will receive e-mail notification when new information is available in MyChart. 9. Click Sign Up. You can now view  your medical record.   Additional Information Remember, MyChart is NOT to be used for urgent needs. For medical emergencies, dial 911.

## 2012-10-20 NOTE — Progress Notes (Signed)
Pelvic pain.  Has been having recurrent uti's and doctor recommended she see [place] to determine if she needs to be referred to a urologist.

## 2012-10-21 LAB — WET PREP FOR TRICH, YEAST, CLUE
Trich, Wet Prep: NONE SEEN
Yeast Wet Prep HPF POC: NONE SEEN

## 2012-10-21 LAB — GC/CHLAMYDIA PROBE AMP: CT Probe RNA: NEGATIVE

## 2012-10-24 ENCOUNTER — Other Ambulatory Visit: Payer: Self-pay | Admitting: Obstetrics & Gynecology

## 2012-10-24 ENCOUNTER — Ambulatory Visit (HOSPITAL_COMMUNITY)
Admission: RE | Admit: 2012-10-24 | Discharge: 2012-10-24 | Disposition: A | Discharge status: Home / Self Care | Payer: BC Managed Care – PPO | Source: Ambulatory Visit | Attending: Obstetrics & Gynecology | Admitting: Obstetrics & Gynecology

## 2012-10-24 DIAGNOSIS — R1032 Left lower quadrant pain: Secondary | ICD-10-CM | POA: Insufficient documentation

## 2012-10-24 DIAGNOSIS — N949 Unspecified condition associated with female genital organs and menstrual cycle: Secondary | ICD-10-CM | POA: Insufficient documentation

## 2012-10-24 DIAGNOSIS — R102 Pelvic and perineal pain: Secondary | ICD-10-CM

## 2012-10-24 DIAGNOSIS — N83209 Unspecified ovarian cyst, unspecified side: Secondary | ICD-10-CM | POA: Insufficient documentation

## 2012-10-30 ENCOUNTER — Encounter: Payer: Self-pay | Admitting: Internal Medicine

## 2012-10-30 ENCOUNTER — Encounter: Payer: Self-pay | Admitting: Obstetrics & Gynecology

## 2012-10-30 ENCOUNTER — Ambulatory Visit (INDEPENDENT_AMBULATORY_CARE_PROVIDER_SITE_OTHER): Payer: BC Managed Care – PPO | Admitting: Obstetrics & Gynecology

## 2012-10-30 VITALS — BP 101/62 | HR 58 | Ht 63.0 in | Wt 134.0 lb

## 2012-10-30 DIAGNOSIS — G8929 Other chronic pain: Secondary | ICD-10-CM

## 2012-10-30 DIAGNOSIS — Z800 Family history of malignant neoplasm of digestive organs: Secondary | ICD-10-CM

## 2012-10-30 DIAGNOSIS — K625 Hemorrhage of anus and rectum: Secondary | ICD-10-CM | POA: Insufficient documentation

## 2012-10-30 DIAGNOSIS — N949 Unspecified condition associated with female genital organs and menstrual cycle: Secondary | ICD-10-CM

## 2012-10-30 DIAGNOSIS — N83209 Unspecified ovarian cyst, unspecified side: Secondary | ICD-10-CM

## 2012-10-30 DIAGNOSIS — N83202 Unspecified ovarian cyst, left side: Secondary | ICD-10-CM | POA: Insufficient documentation

## 2012-10-30 MED ORDER — HYDROCODONE-ACETAMINOPHEN 5-325 MG PO TABS: 1.0000 | ORAL_TABLET | Freq: Four times a day (QID) | ORAL | Status: DC | PRN

## 2012-10-30 MED ORDER — CYCLOBENZAPRINE HCL 10 MG PO TABS: 10.0000 mg | ORAL_TABLET | Freq: Three times a day (TID) | ORAL | Status: DC | PRN

## 2012-10-30 NOTE — Patient Instructions (Addendum)
Rectal Bleeding Rectal bleeding is when blood passes out of the anus. It is usually a sign that something is wrong. It may not be serious, but it should always be evaluated. Rectal bleeding may present as bright red blood or extremely dark stools. The color may range from dark red or maroon to black (like tar). It is important that the cause of rectal bleeding be identified so treatment can be started and the problem corrected. CAUSES   Hemorrhoids. These are enlarged (dilated) blood vessels or veins in the anal or rectal area.  Fistulas. Theseare abnormal, burrowing channels that usually run from inside the rectum to the skin around the anus. They can bleed.  Anal fissures. This is a tear in the tissue of the anus. Bleeding occurs with bowel movements.  Diverticulosis. This is a condition in which pockets or sacs project from the bowel wall. Occasionally, the sacs can bleed.  Diverticulitis. Thisis an infection involving diverticulosis of the colon.  Proctitis and colitis. These are conditions in which the rectum, colon, or both, can become inflamed and pitted (ulcerated).  Polyps and cancer. Polyps are non-cancerous (benign) growths in the colon that may bleed. Certain types of polyps turn into cancer.  Protrusion of the rectum. Part of the rectum can project from the anus and bleed.  Certain medicines.  Intestinal infections.  Blood vessel abnormalities. HOME CARE INSTRUCTIONS  Eat a high-fiber diet to keep your stool soft.  Limit activity.  Drink enough fluids to keep your urine clear or pale yellow.  Warm baths may be useful to soothe rectal pain.  Follow up with your caregiver as directed. SEEK IMMEDIATE MEDICAL CARE IF:  You develop increased bleeding.  You have black or dark red stools.  You vomit blood or material that looks like coffee grounds.  You have abdominal pain or tenderness.  You have a fever.  You feel weak, nauseous, or you faint.  You have  severe rectal pain or you are unable to have a bowel movement. MAKE SURE YOU:  Understand these instructions.  Will watch your condition.  Will get help right away if you are not doing well or get worse. Document Released: 07/10/2001 Document Revised: 04/12/2011 Document Reviewed: 07/05/2010 [facility] Patient Information 2014 [place], [place].   Diagnostic Laparoscopy Laparoscopy is a surgical procedure. It is used to diagnose and treat diseases inside the belly(abdomen). It is usually a brief, common, and relatively simple procedure. The laparoscopeis a thin, lighted, pencil-sized instrument. It is like a telescope. It is inserted into your abdomen through a small cut (incision). Your caregiver can look at the organs inside your body through this instrument. He or she can see if there is anything abnormal. Laparoscopy can be done either in a hospital or outpatient clinic. You may be given a mild sedative to help you relax before the procedure. Once in the operating room, you will be given a drug to make you sleep (general anesthesia). Laparoscopy usually lasts less than 1 hour. After the procedure, you will be monitored in a recovery area until you are stable and doing well. Once you are home, it will take 2 to 3 days to fully recover. RISKS AND COMPLICATIONS  Laparoscopy has relatively few risks. Your caregiver will discuss the risks with you before the procedure. Some problems that can occur include:  Infection.  Bleeding.  Damage to other organs.  Anesthetic side effects. PROCEDURE Once you receive anesthesia, your surgeon inflates the abdomen with a harmless gas (carbon dioxide). This  makes the organs easier to see. The laparoscope is inserted into the abdomen through a small incision. This allows your surgeon to see into the abdomen. Other small instruments are also inserted into the abdomen through other small openings. Many surgeons attach a video camera to the laparoscope to enlarge  the view. During a diagnostic laparoscopy, the surgeon may be looking for inflammation, infection, or cancer. Your surgeon may take tissue samples(biopsies). The samples are sent to a specialist in looking at cells and tissue samples (pathologist). The pathologist examines them under a microscope. Biopsies can help to diagnose or confirm a disease. AFTER THE PROCEDURE   The gas is released from inside the abdomen.  The incisions are closed with stitches (sutures). Because these incisions are small (usually less than 1/2 inch), there is usually minimal discomfort after the procedure. There may be some mild discomfort in the throat. This is from the tube placed in the throat while you were sleeping. You may have some mild abdominal discomfort. There may also be discomfort from the instrument placement incisions in the abdomen.  The recovery time is shortened as long as there are no complications.  You will rest in a recovery room until stable and doing well. As long as there are no complications, you may be allowed to go home. FINDING OUT THE RESULTS OF YOUR TEST Not all test results are available during your visit. If your test results are not back during the visit, make an appointment with your caregiver to find out the results. Do not assume everything is normal if you have not heard from your caregiver or the medical facility. It is important for you to follow up on all of your test results. HOME CARE INSTRUCTIONS   Take all medicines as directed.  Only take over-the-counter or prescription medicines for pain, discomfort, or fever as directed by your caregiver.  Resume daily activities as directed.  Showers are preferred over baths.  You may resume sexual activities in 1 week or as directed.  Do not drive while taking narcotics. SEEK MEDICAL CARE IF:   There is increasing abdominal pain.  There is new pain in the shoulders (shoulder strap areas).  You feel lightheaded or  faint.  You have the chills.  You or your child has an oral temperature above 102 F (38.9 C).  There is pus-like (purulent) drainage from any of the wounds.  You are unable to pass gas or have a bowel movement.  You feel sick to your stomach (nauseous) or throw up (vomit). MAKE SURE YOU:   Understand these instructions.  Will watch your condition.  Will get help right away if you are not doing well or get worse. Document Released: 04/26/2000 Document Revised: 04/12/2011 Document Reviewed: 01/18/2007 [facility] Patient Information 2014 [place], [place].

## 2012-10-30 NOTE — Progress Notes (Signed)
GYNECOLOGY CLINIC ENCOUNTER NOTE  History:  32 y.o. [id number] here today for follow up of evaluation of pelvic pain.  Patient reports that she forgot to tell me last visit that she has episodes of bleeding per rectum; can be very heavy at times.  Last episode was about 3 weeks ago.  Also reports having bouts of diarrhea/constipation. She is worried because her grandfather just had a colon cancer relapse. No colon cancer history in first degree relatives.  Pain is worse today, all over her pelvis and abdomen, radiating to her back and down her legs.  Not alleviated much with Tramadol and Meloxicam.  The following portions of the patient's history were reviewed and updated as appropriate: allergies, current medications, past family history, past medical history, past social history, past surgical history and problem list.  Review of Systems:  Pertinent items are noted in HPI.  Objective:  BP 101/62  Pulse 58  Ht 5\' 3"  (1.6 m)  Wt 134 lb (60.782 kg)  BMI 23.74 kg/m2 Physical Exam deferred  Labs and Imaging 10/20/2012 Negative GC and Chlam  10/26/2012  TRANSABDOMINAL AND TRANSVAGINAL ULTRASOUND OF PELVIS  CLINICAL DATA:  Pelvic pain. Left lower quadrant pain. Prior endometrial ablation.  EXAM: TECHNIQUE: Both transabdominal and transvaginal ultrasound examinations of the pelvis were performed. Transabdominal technique was performed for global imaging of the pelvis including uterus, ovaries, adnexal regions, and pelvic cul-de-sac. It was necessary to proceed with endovaginal exam following the transabdominal exam to visualize the endometrium and ovaries .  COMPARISON:  07/11/2012 and 06/21/2012  FINDINGS: Uterus  Measurements: 9.0 x 3.4 x 5.3 cm. Small fundal fibroid again noted measuring approximately 1.4 cm.  Endometrium  Thickness: Thin and poorly visualized, consistent with prior history of endometrial ablation. No focal abnormality visualized.  Right ovary  Measurements: 2.2 x 1.8 x 1.5 cm. Normal  appearance/no adnexal mass.  Left ovary  Measurements: 3.8 x 2.7 x 3.0 cm. A cystic lesion is seen measuring 2.9 x 2.4 x 2.3 cm, which contains a small mural nodule measuring 4 mm. No internal blood flow visualized within this nodule on color Doppler ultrasound.  Other findings  No free fluid.  IMPRESSION: Stable small fundal fibroid measuring 1.4 cm.  2.9 cm left ovarian cystic lesion containing a 4 mm mural nodule. A benign cystic ovarian neoplasm cannot be excluded. Suggest followup by transvaginal pelvic ultrasound in 6-12 weeks.   Electronically Signed   By: Myles Rosenthal   On: 10/26/2012 15:52   Assessment & Plan:  Patient reported GI bleeding today, will refer to GI ASAP for evaluation. Advised to stop taking Meloxicam and any other NSAIDs. Prescribed Vicodin and Flexeril for her pain.  Pain does not seem to be gynecologic but she reports having a significant lower pelvic pain but this is not consistent with findings on ultrasound.  Recommended diagnostic laparoscopy to evaluate for GYN etiology; this will only be done after her GI evaluation. Procedure was discussed in detail.  The risks of surgery were also discussed in detail with the patient including but not limited to: bleeding; infection which may require antibiotic therapy; injury to bowel, bladder, ureters and major vessels or other surrounding organs; need for additional procedures including laparotomy; thromboembolic phenomenon, incisional problems and other postoperative or anesthesia complications.  Patient was told that the likelihood that her condition and symptoms will be treated effectively with this surgical management was unknown as it is possible no etiology may be found on laparoscopy.   The postoperative expectations were  also discussed in detail.  All questions were answered.  She was told that she will be contacted by our surgical scheduler regarding the time and date of her surgery; routine preoperative instructions of having nothing  to eat or drink after midnight on the day prior to surgery and also coming to the hospital 1.5 hours prior to her time of surgery were also emphasized.  She was told she may be called for a preoperative appointment about a week prior to surgery and will be given further preoperative instructions at that visit. Printed patient education handouts about the procedure were given to the patient to review at home.  Will follow up GI recommendations.  Pain, GI bleeding precautions were strongly emphasized.  Follow up scan will be ordered in about 8 weeks to evaluate left ovarian complex cyst.

## 2012-11-21 ENCOUNTER — Telehealth: Payer: Self-pay

## 2012-11-21 ENCOUNTER — Other Ambulatory Visit: Payer: Self-pay | Admitting: Family Medicine

## 2012-11-21 DIAGNOSIS — G8929 Other chronic pain: Secondary | ICD-10-CM

## 2012-11-21 MED ORDER — HYDROCODONE-ACETAMINOPHEN 5-325 MG PO TABS: 1.0000 | ORAL_TABLET | Freq: Four times a day (QID) | ORAL | Status: DC | PRN

## 2012-11-21 NOTE — Telephone Encounter (Signed)
Pain medication written for her by Dr. Shawnie Pons.

## 2012-11-21 NOTE — Telephone Encounter (Signed)
Patient is requesting another refill on her pain medicine. Her procedure is not till next week and she is in a lot of pain. Can you call her something in till next week please? She called twice already. She uses [facility], thanks.

## 2012-11-24 ENCOUNTER — Telehealth: Payer: Self-pay | Admitting: *Deleted
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
## 2012-11-27 ENCOUNTER — Inpatient Hospital Stay (HOSPITAL_COMMUNITY): Admission: RE | Admit: 2012-11-27 | Payer: Self-pay | Source: Ambulatory Visit

## 2012-11-28 ENCOUNTER — Encounter: Payer: Self-pay | Admitting: Family Medicine

## 2012-12-04 ENCOUNTER — Ambulatory Visit: Payer: Self-pay | Admitting: Internal Medicine

## 2012-12-05 ENCOUNTER — Encounter (HOSPITAL_COMMUNITY): Admission: RE | Payer: Self-pay | Source: Ambulatory Visit

## 2012-12-05 ENCOUNTER — Ambulatory Visit (HOSPITAL_COMMUNITY)
Admission: RE | Admit: 2012-12-05 | Payer: BC Managed Care – PPO | Source: Ambulatory Visit | Admitting: Obstetrics & Gynecology

## 2012-12-05 SURGERY — LAPAROSCOPY, DIAGNOSTIC
Anesthesia: Choice

## 2012-12-06 ENCOUNTER — Other Ambulatory Visit: Payer: Self-pay | Admitting: Gastroenterology

## 2012-12-06 ENCOUNTER — Emergency Department (HOSPITAL_COMMUNITY)
Admission: EM | Admit: 2012-12-06 | Discharge: 2012-12-06 | Disposition: A | Discharge status: Home / Self Care | Payer: BC Managed Care – PPO | Attending: Emergency Medicine | Admitting: Emergency Medicine

## 2012-12-06 ENCOUNTER — Encounter (HOSPITAL_COMMUNITY): Payer: Self-pay | Admitting: Emergency Medicine

## 2012-12-06 DIAGNOSIS — G40909 Epilepsy, unspecified, not intractable, without status epilepticus: Secondary | ICD-10-CM | POA: Insufficient documentation

## 2012-12-06 DIAGNOSIS — Q438 Other specified congenital malformations of intestine: Secondary | ICD-10-CM

## 2012-12-06 DIAGNOSIS — E039 Hypothyroidism, unspecified: Secondary | ICD-10-CM | POA: Insufficient documentation

## 2012-12-06 DIAGNOSIS — Z791 Long term (current) use of non-steroidal anti-inflammatories (NSAID): Secondary | ICD-10-CM | POA: Insufficient documentation

## 2012-12-06 DIAGNOSIS — K625 Hemorrhage of anus and rectum: Secondary | ICD-10-CM

## 2012-12-06 DIAGNOSIS — R519 Headache, unspecified: Secondary | ICD-10-CM

## 2012-12-06 DIAGNOSIS — IMO0002 Reserved for concepts with insufficient information to code with codable children: Secondary | ICD-10-CM | POA: Insufficient documentation

## 2012-12-06 DIAGNOSIS — F410 Panic disorder [episodic paroxysmal anxiety] without agoraphobia: Secondary | ICD-10-CM | POA: Insufficient documentation

## 2012-12-06 DIAGNOSIS — G43909 Migraine, unspecified, not intractable, without status migrainosus: Secondary | ICD-10-CM | POA: Insufficient documentation

## 2012-12-06 DIAGNOSIS — F17200 Nicotine dependence, unspecified, uncomplicated: Secondary | ICD-10-CM | POA: Insufficient documentation

## 2012-12-06 DIAGNOSIS — Z87440 Personal history of urinary (tract) infections: Secondary | ICD-10-CM | POA: Insufficient documentation

## 2012-12-06 DIAGNOSIS — Z8619 Personal history of other infectious and parasitic diseases: Secondary | ICD-10-CM | POA: Insufficient documentation

## 2012-12-06 DIAGNOSIS — Z79899 Other long term (current) drug therapy: Secondary | ICD-10-CM | POA: Insufficient documentation

## 2012-12-06 DIAGNOSIS — F319 Bipolar disorder, unspecified: Secondary | ICD-10-CM | POA: Insufficient documentation

## 2012-12-06 MED ORDER — KETOROLAC TROMETHAMINE 30 MG/ML IJ SOLN
30.0000 mg | Freq: Once | INTRAMUSCULAR | Status: AC
  Administered 2012-12-06: 30 mg via INTRAVENOUS
  Filled 2012-12-06: qty 1

## 2012-12-06 MED ORDER — METOCLOPRAMIDE HCL 5 MG/ML IJ SOLN
10.0000 mg | Freq: Once | INTRAMUSCULAR | Status: AC
  Administered 2012-12-06: 10 mg via INTRAVENOUS
  Filled 2012-12-06: qty 2

## 2012-12-06 MED ORDER — SODIUM CHLORIDE 0.9 % IV BOLUS (SEPSIS)
1000.0000 mL | Freq: Once | INTRAVENOUS | Status: AC
  Administered 2012-12-06: 1000 mL via INTRAVENOUS

## 2012-12-06 MED ORDER — DEXAMETHASONE SODIUM PHOSPHATE 10 MG/ML IJ SOLN
10.0000 mg | Freq: Once | INTRAMUSCULAR | Status: AC
  Administered 2012-12-06: 10 mg via INTRAVENOUS
  Filled 2012-12-06: qty 1

## 2012-12-06 MED ORDER — DIPHENHYDRAMINE HCL 50 MG/ML IJ SOLN
25.0000 mg | Freq: Once | INTRAMUSCULAR | Status: AC
  Administered 2012-12-06: 25 mg via INTRAVENOUS
  Filled 2012-12-06: qty 1

## 2012-12-06 NOTE — ED Notes (Signed)
Pt has ride home.
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
## 2012-12-06 NOTE — ED Notes (Addendum)
C/o headache since yesterday.  Took Imitrex without relief.  Denies nausea and vomiting.  Headache worse when she leans forward or moves head side to side.

## 2012-12-07 NOTE — ED Provider Notes (Signed)
Medical screening examination/treatment/procedure(s) were performed by non-physician practitioner and as supervising physician I was immediately available for consultation/collaboration.  EKG Interpretation   None        Flint Melter, MD 12/07/12 1143

## 2012-12-14 ENCOUNTER — Other Ambulatory Visit: Payer: Self-pay

## 2012-12-21 ENCOUNTER — Encounter: Payer: Self-pay | Admitting: Family Medicine

## 2013-01-08 ENCOUNTER — Ambulatory Visit
Admission: RE | Admit: 2013-01-08 | Discharge: 2013-01-08 | Disposition: A | Discharge status: Home / Self Care | Payer: BC Managed Care – PPO | Source: Ambulatory Visit | Attending: Gastroenterology | Admitting: Gastroenterology

## 2013-01-08 DIAGNOSIS — K625 Hemorrhage of anus and rectum: Secondary | ICD-10-CM

## 2013-01-08 DIAGNOSIS — Q438 Other specified congenital malformations of intestine: Secondary | ICD-10-CM

## 2013-01-16 ENCOUNTER — Encounter: Payer: Self-pay | Admitting: Obstetrics & Gynecology

## 2013-01-16 ENCOUNTER — Ambulatory Visit (INDEPENDENT_AMBULATORY_CARE_PROVIDER_SITE_OTHER): Payer: BC Managed Care – PPO | Admitting: Obstetrics & Gynecology

## 2013-01-16 VITALS — BP 110/78 | HR 62 | Ht 63.0 in | Wt 140.0 lb

## 2013-01-16 DIAGNOSIS — N949 Unspecified condition associated with female genital organs and menstrual cycle: Secondary | ICD-10-CM

## 2013-01-16 DIAGNOSIS — G8929 Other chronic pain: Secondary | ICD-10-CM

## 2013-01-16 MED ORDER — IBUPROFEN 800 MG PO TABS: 800.0000 mg | ORAL_TABLET | Freq: Three times a day (TID) | ORAL | Status: DC | PRN

## 2013-01-16 MED ORDER — CYCLOBENZAPRINE HCL 10 MG PO TABS: 10.0000 mg | ORAL_TABLET | Freq: Three times a day (TID) | ORAL | Status: DC | PRN

## 2013-01-16 MED ORDER — HYDROCODONE-ACETAMINOPHEN 5-325 MG PO TABS: 2.0000 | ORAL_TABLET | Freq: Four times a day (QID) | ORAL | Status: DC | PRN

## 2013-01-16 MED ORDER — GABAPENTIN 300 MG PO CAPS: 300.0000 mg | ORAL_CAPSULE | Freq: Three times a day (TID) | ORAL | Status: DC | PRN

## 2013-01-16 NOTE — Patient Instructions (Signed)
Diagnostic Laparoscopy  Laparoscopy is a surgical procedure. It is used to diagnose and treat diseases inside the belly (abdomen). It is usually a brief, common, and relatively simple procedure. The laparoscopeis a thin, lighted, pencil-sized instrument. It is like a telescope. It is inserted into your abdomen through a small cut (incision). Your caregiver can look at the organs inside your body through this instrument. He or she can see if there is anything abnormal.  Laparoscopy can be done either in a hospital or outpatient clinic. You may be given a mild sedative to help you relax before the procedure. Once in the operating room, you will be given a drug to make you sleep (general anesthesia). Laparoscopy usually lasts less than 1 hour. After the procedure, you will be monitored in a recovery area until you are stable and doing well. Once you are home, it will take 2 to 3 days to fully recover.  RISKS AND COMPLICATIONS   Laparoscopy has relatively few risks. Your caregiver will discuss the risks with you before the procedure.  Some problems that can occur include:  · Infection.  · Bleeding.  · Damage to other organs.  · Anesthetic side effects.  PROCEDURE  Once you receive anesthesia, your surgeon inflates the abdomen with a harmless gas (carbon dioxide). This makes the organs easier to see. The laparoscope is inserted into the abdomen through a small incision. This allows your surgeon to see into the abdomen. Other small instruments are also inserted into the abdomen through other small openings. Many surgeons attach a video camera to the laparoscope to enlarge the view.  During a diagnostic laparoscopy, the surgeon may be looking for inflammation, infection, or cancer. Your surgeon may take tissue samples(biopsies). The samples are sent to a specialist in looking at cells and tissue samples (pathologist). The pathologist examines them under a microscope. Biopsies can help to diagnose or confirm a  disease.  AFTER THE PROCEDURE   · The gas is released from inside the abdomen.  · The incisions are closed with stitches (sutures). Because these incisions are small (usually less than 1/2 inch), there is usually minimal discomfort after the procedure. There may be some mild discomfort in the throat. This is from the tube placed in the throat while you were sleeping. You may have some mild abdominal discomfort. There may also be discomfort from the instrument placement incisions in the abdomen.  · The recovery time is shortened as long as there are no complications.  · You will rest in a recovery room until stable and doing well. As long as there are no complications, you may be allowed to go home.  FINDING OUT THE RESULTS OF YOUR TEST  Not all test results are available during your visit. If your test results are not back during the visit, make an appointment with your caregiver to find out the results. Do not assume everything is normal if you have not heard from your caregiver or the medical facility. It is important for you to follow up on all of your test results.  HOME CARE INSTRUCTIONS   · Take all medicines as directed.  · Only take over-the-counter or prescription medicines for pain, discomfort, or fever as directed by your caregiver.  · Resume daily activities as directed.  · Showers are preferred over baths.  · You may resume sexual activities in 1 week or as directed.  · Do not drive while taking narcotics.  SEEK MEDICAL CARE IF:   · There is   increasing abdominal pain.  · There is new pain in the shoulders (shoulder strap areas).  · You feel lightheaded or faint.  · You have the chills.  · You or your child has an oral temperature above 102° F (38.9° C).  · There is pus-like (purulent) drainage from any of the wounds.  · You are unable to pass gas or have a bowel movement.  · You feel sick to your stomach (nauseous) or throw up (vomit).  MAKE SURE YOU:   · Understand these instructions.  · Will watch  your condition.  · Will get help right away if you are not doing well or get worse.  Document Released: 04/26/2000 Document Revised: 05/15/2012 Document Reviewed: 01/18/2007  Exit[employer]® Patient Information ©2014 [employer].

## 2013-01-16 NOTE — Progress Notes (Signed)
GYNECOLOGY CLINIC ENCOUNTER NOTE  History:  32 y.o. [id number] here today for discussion about rescheduling diagnostic laparoscopy for chronic pelvic pain.  She has a history of four cesarean sections.  Last surgery was cancelled because of report of rectal bleeding; colonoscopy showed some benign polyps which were removed and virtual colonoscopy was negative for any anomalies.  There was an annotation of the uterus being beneath the anterior lower pelvic wall, suggesting adhesions from prior cesarean sections.  She still reports moderate constant pelvic pain and desires refill of her pain medications.  She has been able to tolerate Ibuprofen since the polyps have been removed, no further GI bleeding.   The following portions of the patient's history were reviewed and updated as appropriate: allergies, current medications, past family history, past medical history, past social history, past surgical history and problem list.  Normal pap in 05/19/11.  Review of Systems:  Pertinent items are noted in HPI.  Objective:  Physical Exam BP 110/78  Pulse 62  Ht 5\' 3"  (1.6 m)  Wt 140 lb (63.504 kg)  BMI 24.81 kg/m2 Gen: NAD Abd: Soft, diffuse tenderness to palpation in lower abdomen, nondistended Pelvic: Deferred  Labs and Imaging  01/11/2013 CT VIRTUAL COLONOSCOPY DIAGNOSTIC  CLINICAL DATA:  Rectal bleeding, lower abdominal pain, diarrhea/constipation. Incomplete optical colonoscopy on 11/2012.  TECHNIQUE: The patient was given a standard Mag citrate bowel preparation with Gastrografin and barium for fluid and stool tagging respectively. The quality of the bowel preparation is moderate. Automated CO2 insufflation of the colon was performed prior to image acquisition and colonic distention is moderate. Image post processing was used to generate a 3D endoluminal fly-through projection of the colon and to electronically subtract stool/fluid as appropriate.  COMPARISON:  None.  FINDINGS: VIRTUAL  COLONOSCOPY  A single loop of sigmoid colon is mildly decompressed on the supine images, although better insufflated on prone imaging.  No clinically significant polypoid lesion is seen.  No colonic wall thickening, mass, or stricture is evident.  Virtual colonoscopy is not designed to detect diminutive polyps (i.e., less than or equal to 5 mm), the presence or absence of which may not affect clinical management.  CT ABDOMEN AND PELVIS WITHOUT CONTRAST  Lung bases are clear.  Unenhanced liver, spleen, pancreas, and adrenal glands are within normal limits.  Gallbladder is grossly unremarkable. No intrahepatic or extrahepatic ductal dilatation.  Kidneys are within normal limits. No renal calculi or hydronephrosis.  No evidence of bowel obstruction.  Normal appendix.  No evidence of abdominal aortic aneurysm.  No abdominopelvic ascites.  No suspicious abdominopelvic lymphadenopathy.  Uterus is beneath the anterior lower pelvic wall, suggesting prior C-section. Bilateral ovaries are unremarkable.  Bladder is underdistended but within normal limits.  Mild degenerative changes of the lower thoracic spine and at L5-S1.  IMPRESSION: Negative virtual colonoscopy.  Negative unenhanced CT abdomen/pelvis.   Electronically Signed   By: Charline Bills M.D.   On: 01/11/2013 13:13    Assessment & Plan:   Surgical order resent, patient to be contacted with time and date of upcoming surgery She was told of possibility of persistence of pelvic pain after surgery; may not find GYN etiology and lysis of adhesions may not alleviate her pain.  Risks of laparoscopy reviewed especially risk of injury to bowel which may require open surgery, bowel resection, colostomy; bladder injury which may require indwelling catheter; risk of needing open surgery for any complications; risk of bleeding and infection etc.  All questions answered.  Vicodin, Ibuprofen,  Flexeril prescribed; will also have a trial of Neurontin to see if there is some  neuropathic element of this pain.  Pain precautions advised.   Jaynie Collins, MD, FACOG Attending Obstetrician & Gynecologist Faculty Practice, [facility] [place]

## 2013-01-29 ENCOUNTER — Telehealth (HOSPITAL_COMMUNITY): Payer: Self-pay | Admitting: Obstetrics & Gynecology

## 2013-01-29 NOTE — Telephone Encounter (Signed)
Patient called to cancel her upcoming surgery for 03/20/13 due to a scheduled trip and "issues" with her insurance.  Patient will contact the [facility] office when she is ready to reschedule.

## 2013-03-12 ENCOUNTER — Encounter: Payer: Self-pay | Admitting: *Deleted

## 2013-03-20 ENCOUNTER — Ambulatory Visit: Admit: 2013-03-20 | Payer: BC Managed Care – PPO | Admitting: Obstetrics & Gynecology

## 2013-03-20 SURGERY — LAPAROSCOPY, DIAGNOSTIC
Anesthesia: Choice | Site: Abdomen

## 2013-11-04 ENCOUNTER — Ambulatory Visit (HOSPITAL_COMMUNITY)
Admission: RE | Admit: 2013-11-04 | Discharge: 2013-11-04 | Disposition: A | Discharge status: Home / Self Care | Payer: BC Managed Care – PPO | Attending: Psychiatry | Admitting: Psychiatry

## 2013-11-04 DIAGNOSIS — F17210 Nicotine dependence, cigarettes, uncomplicated: Secondary | ICD-10-CM | POA: Insufficient documentation

## 2013-11-04 DIAGNOSIS — F319 Bipolar disorder, unspecified: Secondary | ICD-10-CM | POA: Diagnosis not present

## 2013-11-04 DIAGNOSIS — F329 Major depressive disorder, single episode, unspecified: Secondary | ICD-10-CM | POA: Diagnosis present

## 2013-11-04 DIAGNOSIS — E039 Hypothyroidism, unspecified: Secondary | ICD-10-CM | POA: Insufficient documentation

## 2013-11-04 DIAGNOSIS — F4310 Post-traumatic stress disorder, unspecified: Secondary | ICD-10-CM | POA: Insufficient documentation

## 2013-11-04 DIAGNOSIS — K219 Gastro-esophageal reflux disease without esophagitis: Secondary | ICD-10-CM | POA: Diagnosis not present

## 2013-11-04 DIAGNOSIS — F1410 Cocaine abuse, uncomplicated: Secondary | ICD-10-CM | POA: Diagnosis not present

## 2013-11-04 DIAGNOSIS — G40909 Epilepsy, unspecified, not intractable, without status epilepticus: Secondary | ICD-10-CM | POA: Diagnosis not present

## 2013-11-04 DIAGNOSIS — Z818 Family history of other mental and behavioral disorders: Secondary | ICD-10-CM | POA: Diagnosis not present

## 2013-11-04 DIAGNOSIS — F1010 Alcohol abuse, uncomplicated: Secondary | ICD-10-CM | POA: Diagnosis not present
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
## 2013-12-03 ENCOUNTER — Encounter: Payer: Self-pay | Admitting: *Deleted

## 2014-02-14 ENCOUNTER — Encounter (HOSPITAL_COMMUNITY): Payer: Self-pay | Admitting: Obstetrics & Gynecology

## 2014-02-26 ENCOUNTER — Ambulatory Visit: Payer: Self-pay | Admitting: Unknown Physician Specialty

## 2014-04-17 ENCOUNTER — Emergency Department (HOSPITAL_COMMUNITY)
Admission: EM | Admit: 2014-04-17 | Discharge: 2014-04-17 | Disposition: A | Discharge status: Home / Self Care | Payer: BC Managed Care – PPO | Attending: Emergency Medicine | Admitting: Emergency Medicine

## 2014-04-17 ENCOUNTER — Encounter (HOSPITAL_COMMUNITY): Payer: Self-pay | Admitting: Family Medicine

## 2014-04-17 DIAGNOSIS — Z87828 Personal history of other (healed) physical injury and trauma: Secondary | ICD-10-CM | POA: Insufficient documentation

## 2014-04-17 DIAGNOSIS — G40909 Epilepsy, unspecified, not intractable, without status epilepticus: Secondary | ICD-10-CM | POA: Insufficient documentation

## 2014-04-17 DIAGNOSIS — R202 Paresthesia of skin: Secondary | ICD-10-CM | POA: Insufficient documentation

## 2014-04-17 DIAGNOSIS — F4310 Post-traumatic stress disorder, unspecified: Secondary | ICD-10-CM | POA: Diagnosis not present

## 2014-04-17 DIAGNOSIS — R519 Headache, unspecified: Secondary | ICD-10-CM

## 2014-04-17 DIAGNOSIS — Z87440 Personal history of urinary (tract) infections: Secondary | ICD-10-CM | POA: Insufficient documentation

## 2014-04-17 DIAGNOSIS — R200 Anesthesia of skin: Secondary | ICD-10-CM | POA: Diagnosis not present

## 2014-04-17 DIAGNOSIS — Z8719 Personal history of other diseases of the digestive system: Secondary | ICD-10-CM | POA: Insufficient documentation

## 2014-04-17 DIAGNOSIS — Z720 Tobacco use: Secondary | ICD-10-CM | POA: Diagnosis not present

## 2014-04-17 DIAGNOSIS — F419 Anxiety disorder, unspecified: Secondary | ICD-10-CM | POA: Diagnosis not present

## 2014-04-17 DIAGNOSIS — G43909 Migraine, unspecified, not intractable, without status migrainosus: Secondary | ICD-10-CM | POA: Insufficient documentation

## 2014-04-17 DIAGNOSIS — Z8619 Personal history of other infectious and parasitic diseases: Secondary | ICD-10-CM | POA: Insufficient documentation

## 2014-04-17 DIAGNOSIS — R51 Headache: Secondary | ICD-10-CM

## 2014-04-17 DIAGNOSIS — F319 Bipolar disorder, unspecified: Secondary | ICD-10-CM | POA: Insufficient documentation

## 2014-04-17 DIAGNOSIS — R251 Tremor, unspecified: Secondary | ICD-10-CM | POA: Diagnosis not present

## 2014-04-17 DIAGNOSIS — Z79899 Other long term (current) drug therapy: Secondary | ICD-10-CM | POA: Insufficient documentation

## 2014-04-17 DIAGNOSIS — E039 Hypothyroidism, unspecified: Secondary | ICD-10-CM | POA: Insufficient documentation

## 2014-04-17 DIAGNOSIS — F410 Panic disorder [episodic paroxysmal anxiety] without agoraphobia: Secondary | ICD-10-CM | POA: Diagnosis not present

## 2014-04-17 DIAGNOSIS — Z791 Long term (current) use of non-steroidal anti-inflammatories (NSAID): Secondary | ICD-10-CM | POA: Diagnosis not present

## 2014-04-17 LAB — COMPREHENSIVE METABOLIC PANEL
ALT: 25 U/L (ref 0–35)
ANION GAP: 12 (ref 5–15)
AST: 26 U/L (ref 0–37)
Albumin: 4.5 g/dL (ref 3.5–5.2)
Alkaline Phosphatase: 76 U/L (ref 39–117)
BUN: 10 mg/dL (ref 6–23)
CO2: 20 mmol/L (ref 19–32)
CREATININE: 0.74 mg/dL (ref 0.50–1.10)
Calcium: 9.9 mg/dL (ref 8.4–10.5)
Chloride: 107 mmol/L (ref 96–112)
GFR calc Af Amer: >90 mL/min (ref >90)
Glucose, Bld: 127 mg/dL — ABNORMAL HIGH (ref 70–99)
Potassium: 3.5 mmol/L (ref 3.5–5.1)
Sodium: 139 mmol/L (ref 135–145)
Total Bilirubin: 0.7 mg/dL (ref 0.3–1.2)
Total Protein: 7.2 g/dL (ref 6.0–8.3)

## 2014-04-17 LAB — CBC WITH DIFFERENTIAL/PLATELET
BASOS PCT: 0 % (ref 0–1)
Basophils Absolute: 0 10*3/uL (ref 0.0–0.1)
Eosinophils Absolute: 0.1 10*3/uL (ref 0.0–0.7)
Eosinophils Relative: 1 % (ref 0–5)
HCT: 43.2 % (ref 36.0–46.0)
Hemoglobin: 14.7 g/dL (ref 12.0–15.0)
LYMPHS ABS: 1.9 10*3/uL (ref 0.7–4.0)
Lymphocytes Relative: 26 % (ref 12–46)
MCH: 31.5 pg (ref 26.0–34.0)
MCHC: 34 g/dL (ref 30.0–36.0)
MCV: 92.7 fL (ref 78.0–100.0)
MONOS PCT: 5 % (ref 3–12)
Monocytes Absolute: 0.4 10*3/uL (ref 0.1–1.0)
NEUTROS ABS: 5 10*3/uL (ref 1.7–7.7)
NEUTROS PCT: 68 % (ref 43–77)
Platelets: 205 10*3/uL (ref 150–400)
RBC: 4.66 MIL/uL (ref 3.87–5.11)
RDW: 13.1 % (ref 11.5–15.5)
WBC: 7.3 10*3/uL (ref 4.0–10.5)

## 2014-04-17 MED ORDER — LORAZEPAM 1 MG PO TABS
1.0000 mg | ORAL_TABLET | Freq: Once | ORAL | Status: AC
  Administered 2014-04-17: 1 mg via ORAL
  Filled 2014-04-17: qty 1

## 2014-04-17 NOTE — ED Notes (Signed)
Per pt sts she has been having headaches, tingling and numbness in head, shaking x a few weeks. Has been seen by multiple doctors to include PCP, therapist and neuro doctor. sts had episode this am with sharp pain on the left side of her head and feels warm.
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
## 2014-04-17 NOTE — Discharge Instructions (Signed)
Return to the emergency room with worsening of symptoms, new symptoms or with symptoms that are concerning , especially severe worsening of headache, visual or speech changes, weakness in face, arms or legs. Ibuprofen 400mg  (2 tablets 200mg ) every 5-6 hours for 3-5 days for headache. Please call your doctor for a followup appointment within 24-48 hours. When you talk to your doctor please let them know that you were seen in the emergency department and have them acquire all of your records so that they can discuss the findings with you and formulate a treatment plan to fully care for your new and ongoing problems.  Read below information and follow recommendations. General Headache Without Cause A headache is pain or discomfort felt around the head or neck area. The specific cause of a headache may not be found. There are many causes and types of headaches. A few common ones are:  Tension headaches.  Migraine headaches.  Cluster headaches.  Chronic daily headaches. HOME CARE INSTRUCTIONS   Keep all follow-up appointments with your caregiver or any specialist referral.  Only take over-the-counter or prescription medicines for pain or discomfort as directed by your caregiver.  Lie down in a dark, quiet room when you have a headache.  Keep a headache journal to find out what may trigger your migraine headaches. For example, write down:  What you eat and drink.  How much sleep you get.  Any change to your diet or medicines.  Try massage or other relaxation techniques.  Put ice packs or heat on the head and neck. Use these 3 to 4 times per day for 15 to 20 minutes each time, or as needed.  Limit stress.  Sit up straight, and do not tense your muscles.  Quit smoking if you smoke.  Limit alcohol use.  Decrease the amount of caffeine you drink, or stop drinking caffeine.  Eat and sleep on a regular schedule.  Get 7 to 9 hours of sleep, or as recommended by your  caregiver.  Keep lights dim if bright lights bother you and make your headaches worse. SEEK MEDICAL CARE IF:   You have problems with the medicines you were prescribed.  Your medicines are not working.  You have a change from the usual headache.  You have nausea or vomiting. SEEK IMMEDIATE MEDICAL CARE IF:   Your headache becomes severe.  You have a fever.  You have a stiff neck.  You have loss of vision.  You have muscular weakness or loss of muscle control.  You start losing your balance or have trouble walking.  You feel faint or pass out.  You have severe symptoms that are different from your first symptoms. MAKE SURE YOU:   Understand these instructions.  Will watch your condition.  Will get help right away if you are not doing well or get worse. Document Released: 01/18/2005 Document Revised: 04/12/2011 Document Reviewed: 02/03/2011 [facility] Patient Information 2015 [employer], [place]. This information is not intended to replace advice given to you by your health care provider. Make sure you discuss any questions you have with your health care provider.

## 2014-07-10 ENCOUNTER — Emergency Department (HOSPITAL_COMMUNITY)
Admission: EM | Admit: 2014-07-10 | Discharge: 2014-07-11 | Disposition: A | Discharge status: Other Acute Inpatient Hospital | Payer: BC Managed Care – PPO | Attending: Emergency Medicine | Admitting: Emergency Medicine

## 2014-07-10 ENCOUNTER — Encounter (HOSPITAL_COMMUNITY): Payer: Self-pay | Admitting: Emergency Medicine

## 2014-07-10 DIAGNOSIS — Z8719 Personal history of other diseases of the digestive system: Secondary | ICD-10-CM | POA: Insufficient documentation

## 2014-07-10 DIAGNOSIS — G43909 Migraine, unspecified, not intractable, without status migrainosus: Secondary | ICD-10-CM | POA: Insufficient documentation

## 2014-07-10 DIAGNOSIS — Z8619 Personal history of other infectious and parasitic diseases: Secondary | ICD-10-CM | POA: Insufficient documentation

## 2014-07-10 DIAGNOSIS — Z3202 Encounter for pregnancy test, result negative: Secondary | ICD-10-CM | POA: Insufficient documentation

## 2014-07-10 DIAGNOSIS — F410 Panic disorder [episodic paroxysmal anxiety] without agoraphobia: Secondary | ICD-10-CM | POA: Insufficient documentation

## 2014-07-10 DIAGNOSIS — F319 Bipolar disorder, unspecified: Secondary | ICD-10-CM | POA: Diagnosis present

## 2014-07-10 DIAGNOSIS — Z720 Tobacco use: Secondary | ICD-10-CM | POA: Insufficient documentation

## 2014-07-10 DIAGNOSIS — G40909 Epilepsy, unspecified, not intractable, without status epilepticus: Secondary | ICD-10-CM | POA: Diagnosis not present

## 2014-07-10 DIAGNOSIS — F4310 Post-traumatic stress disorder, unspecified: Secondary | ICD-10-CM | POA: Diagnosis not present

## 2014-07-10 DIAGNOSIS — F3130 Bipolar disorder, current episode depressed, mild or moderate severity, unspecified: Secondary | ICD-10-CM | POA: Insufficient documentation

## 2014-07-10 DIAGNOSIS — F1490 Cocaine use, unspecified, uncomplicated: Secondary | ICD-10-CM

## 2014-07-10 DIAGNOSIS — F1410 Cocaine abuse, uncomplicated: Secondary | ICD-10-CM | POA: Diagnosis present

## 2014-07-10 DIAGNOSIS — Z87440 Personal history of urinary (tract) infections: Secondary | ICD-10-CM | POA: Insufficient documentation

## 2014-07-10 DIAGNOSIS — Z79899 Other long term (current) drug therapy: Secondary | ICD-10-CM | POA: Diagnosis not present

## 2014-07-10 DIAGNOSIS — R45851 Suicidal ideations: Secondary | ICD-10-CM | POA: Diagnosis not present

## 2014-07-10 DIAGNOSIS — F1310 Sedative, hypnotic or anxiolytic abuse, uncomplicated: Secondary | ICD-10-CM | POA: Diagnosis not present

## 2014-07-10 DIAGNOSIS — F1420 Cocaine dependence, uncomplicated: Secondary | ICD-10-CM | POA: Diagnosis not present

## 2014-07-10 DIAGNOSIS — E039 Hypothyroidism, unspecified: Secondary | ICD-10-CM | POA: Diagnosis not present

## 2014-07-10 LAB — COMPREHENSIVE METABOLIC PANEL
ALK PHOS: 79 U/L (ref 38–126)
ALT: 18 U/L (ref 14–54)
AST: 16 U/L (ref 15–41)
Albumin: 4.3 g/dL (ref 3.5–5.0)
Anion gap: 11 (ref 5–15)
BUN: 10 mg/dL (ref 6–20)
CALCIUM: 9.3 mg/dL (ref 8.9–10.3)
CHLORIDE: 107 mmol/L (ref 101–111)
CO2: 21 mmol/L — ABNORMAL LOW (ref 22–32)
Creatinine, Ser: 0.59 mg/dL (ref 0.44–1.00)
GFR calc Af Amer: >60 mL/min (ref >60)
GFR calc non Af Amer: >60 mL/min (ref >60)
GLUCOSE: 107 mg/dL — AB (ref 65–99)
Potassium: 3.5 mmol/L (ref 3.5–5.1)
Sodium: 139 mmol/L (ref 135–145)
Total Bilirubin: 0.4 mg/dL (ref 0.3–1.2)
Total Protein: 7 g/dL (ref 6.5–8.1)

## 2014-07-10 LAB — CBC
HCT: 43.2 % (ref 36.0–46.0)
Hemoglobin: 14.6 g/dL (ref 12.0–15.0)
MCH: 31.9 pg (ref 26.0–34.0)
MCHC: 33.8 g/dL (ref 30.0–36.0)
MCV: 94.3 fL (ref 78.0–100.0)
Platelets: 226 10*3/uL (ref 150–400)
RBC: 4.58 MIL/uL (ref 3.87–5.11)
RDW: 13.1 % (ref 11.5–15.5)
WBC: 8.5 10*3/uL (ref 4.0–10.5)

## 2014-07-10 LAB — RAPID URINE DRUG SCREEN, HOSP PERFORMED
AMPHETAMINES: NOT DETECTED
BARBITURATES: NOT DETECTED
Benzodiazepines: POSITIVE — AB
Cocaine: POSITIVE — AB
Opiates: NOT DETECTED
Tetrahydrocannabinol: NOT DETECTED

## 2014-07-10 LAB — ETHANOL: Alcohol, Ethyl (B): <5 mg/dL (ref <5)

## 2014-07-10 LAB — ACETAMINOPHEN LEVEL: Acetaminophen (Tylenol), Serum: <10 ug/mL — ABNORMAL LOW (ref 10–30)

## 2014-07-10 LAB — SALICYLATE LEVEL: Salicylate Lvl: <4 mg/dL (ref 2.8–30.0)

## 2014-07-10 LAB — POC URINE PREG, ED: PREG TEST UR: NEGATIVE

## 2014-07-10 MED ORDER — GABAPENTIN 400 MG PO CAPS
400.0000 mg | ORAL_CAPSULE | Freq: Three times a day (TID) | ORAL | Status: DC
  Administered 2014-07-10: 400 mg via ORAL
  Filled 2014-07-10: qty 1

## 2014-07-10 MED ORDER — ASENAPINE MALEATE 5 MG SL SUBL: 5.0000 mg | SUBLINGUAL_TABLET | Freq: Every day | SUBLINGUAL | Status: DC

## 2014-07-10 MED ORDER — NICOTINE 21 MG/24HR TD PT24: 21.0000 mg | MEDICATED_PATCH | Freq: Every day | TRANSDERMAL | Status: DC

## 2014-07-10 MED ORDER — VILAZODONE HCL 20 MG PO TABS
40.0000 mg | ORAL_TABLET | Freq: Every day | ORAL | Status: DC
  Administered 2014-07-11: 40 mg via ORAL
  Filled 2014-07-10 (×2): qty 2

## 2014-07-10 MED ORDER — ASENAPINE MALEATE 10 MG SL SUBL: 10.0000 mg | SUBLINGUAL_TABLET | Freq: Every day | SUBLINGUAL | Status: DC

## 2014-07-10 MED ORDER — LEVOTHYROXINE SODIUM 50 MCG PO TABS
50.0000 ug | ORAL_TABLET | Freq: Every day | ORAL | Status: DC
Start: 2014-07-11 — End: 2014-07-11
  Administered 2014-07-11: 50 ug via ORAL
  Filled 2014-07-10 (×2): qty 1

## 2014-07-10 MED ORDER — IBUPROFEN 200 MG PO TABS: 600.0000 mg | ORAL_TABLET | Freq: Three times a day (TID) | ORAL | Status: DC | PRN

## 2014-07-10 MED ORDER — AMANTADINE HCL 100 MG PO CAPS
100.0000 mg | ORAL_CAPSULE | Freq: Two times a day (BID) | ORAL | Status: DC
  Administered 2014-07-10: 100 mg via ORAL
  Filled 2014-07-10 (×2): qty 1

## 2014-07-10 MED ORDER — LORAZEPAM 1 MG PO TABS: 1.0000 mg | ORAL_TABLET | Freq: Three times a day (TID) | ORAL | Status: DC | PRN

## 2014-07-10 MED ORDER — ONDANSETRON HCL 4 MG PO TABS: 4.0000 mg | ORAL_TABLET | Freq: Three times a day (TID) | ORAL | Status: DC | PRN

## 2014-07-10 MED ORDER — GABAPENTIN 400 MG PO CAPS
400.0000 mg | ORAL_CAPSULE | Freq: Three times a day (TID) | ORAL | Status: DC
  Administered 2014-07-11: 400 mg via ORAL
  Filled 2014-07-10 (×2): qty 1

## 2014-07-10 MED ORDER — TOPIRAMATE 25 MG PO TABS: 150.0000 mg | ORAL_TABLET | Freq: Every day | ORAL | Status: DC

## 2014-07-10 MED ORDER — BUPROPION HCL ER (XL) 300 MG PO TB24: 300.0000 mg | ORAL_TABLET | Freq: Every day | ORAL | Status: DC

## 2014-07-10 MED ORDER — ALPRAZOLAM 1 MG PO TABS
1.0000 mg | ORAL_TABLET | Freq: Three times a day (TID) | ORAL | Status: DC
  Administered 2014-07-10: 1 mg via ORAL
  Filled 2014-07-10: qty 1

## 2014-07-10 MED ORDER — ASENAPINE MALEATE 5 MG SL SUBL
10.0000 mg | SUBLINGUAL_TABLET | Freq: Two times a day (BID) | SUBLINGUAL | Status: DC
  Administered 2014-07-10 – 2014-07-11 (×2): 10 mg via SUBLINGUAL
  Filled 2014-07-10 (×3): qty 2

## 2014-07-10 MED ORDER — VILAZODONE HCL 20 MG PO TABS: 40.0000 mg | ORAL_TABLET | Freq: Every day | ORAL | Status: DC

## 2014-07-10 MED ORDER — CYCLOBENZAPRINE HCL 10 MG PO TABS: 10.0000 mg | ORAL_TABLET | Freq: Three times a day (TID) | ORAL | Status: DC | PRN

## 2014-07-10 MED ORDER — ZOLPIDEM TARTRATE 5 MG PO TABS: 5.0000 mg | ORAL_TABLET | Freq: Every evening | ORAL | Status: DC | PRN

## 2014-07-10 MED ORDER — ACETAMINOPHEN 325 MG PO TABS: 650.0000 mg | ORAL_TABLET | ORAL | Status: DC | PRN

## 2014-07-10 MED ORDER — NICOTINE 21 MG/24HR TD PT24
21.0000 mg | MEDICATED_PATCH | Freq: Every day | TRANSDERMAL | Status: DC
  Filled 2014-07-10: qty 1

## 2014-07-10 MED ORDER — TOPIRAMATE 25 MG PO TABS
150.0000 mg | ORAL_TABLET | Freq: Every day | ORAL | Status: DC
  Administered 2014-07-10: 150 mg via ORAL
  Filled 2014-07-10: qty 2
  Filled 2014-07-10: qty 1

## 2014-07-10 MED ORDER — AMANTADINE HCL 100 MG PO CAPS
100.0000 mg | ORAL_CAPSULE | Freq: Two times a day (BID) | ORAL | Status: DC
  Administered 2014-07-11: 100 mg via ORAL
  Filled 2014-07-10 (×3): qty 1

## 2014-07-10 MED ORDER — ALUM & MAG HYDROXIDE-SIMETH 200-200-20 MG/5ML PO SUSP: 30.0000 mL | ORAL | Status: DC | PRN

## 2014-07-10 MED ORDER — LEVOTHYROXINE SODIUM 50 MCG PO TABS: 50.0000 ug | ORAL_TABLET | Freq: Every day | ORAL | Status: DC

## 2014-07-10 MED ORDER — SUMATRIPTAN SUCCINATE 100 MG PO TABS: 100.0000 mg | ORAL_TABLET | ORAL | Status: DC | PRN

## 2014-07-10 MED ORDER — SUMATRIPTAN SUCCINATE 100 MG PO TABS
100.0000 mg | ORAL_TABLET | ORAL | Status: DC | PRN
  Filled 2014-07-10: qty 1

## 2014-07-10 MED ORDER — ASENAPINE MALEATE 5 MG SL SUBL
10.0000 mg | SUBLINGUAL_TABLET | Freq: Every day | SUBLINGUAL | Status: DC
  Filled 2014-07-10: qty 2

## 2014-07-10 MED ORDER — ZOLPIDEM TARTRATE 5 MG PO TABS
5.0000 mg | ORAL_TABLET | Freq: Every evening | ORAL | Status: DC | PRN
  Filled 2014-07-10: qty 1
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
## 2014-07-10 NOTE — ED Notes (Signed)
Bed: [facility] Expected date: 07/10/14 Expected time: 10:29 PM Means of arrival:  Comments: Hold for Hernan K

## 2014-07-10 NOTE — ED Notes (Signed)
Patient appears sad, flat. Denies SI, HI, AVH. Reports feelings of anxiety and depression at 10/10. States that she has been having trouble falling and remaining asleep. Reports decreased appetite with unremarkable weight change.   Encouragement offered. Oriented patient to the unit.  Q 15 safety checks in place.

## 2014-07-10 NOTE — ED Notes (Signed)
Pt brought in by [facility] for suicidal threats by overdose on her medications.  Pt states that she sent text to her family/friends today threatening to kill herself and her friend called 911.   pt states that she was just blowing off steam and been really stressed for a long time.  Pt states that deputies counted her medications and there is not more missing than should be.  Pt states that she is bipolar and the doctors cant seem to get her medications right.  And seeing a new doctor now and sees him next week.  Pt has husband here in room with her.

## 2014-07-10 NOTE — ED Notes (Signed)
Pt wanded by security. 

## 2014-07-10 NOTE — ED Notes (Signed)
TTS in room talking to patient. 

## 2014-07-11 ENCOUNTER — Encounter (HOSPITAL_COMMUNITY): Payer: Self-pay | Admitting: Obstetrics & Gynecology

## 2014-07-11 ENCOUNTER — Inpatient Hospital Stay (HOSPITAL_COMMUNITY)
Admission: AD | Admit: 2014-07-11 | Discharge: 2014-07-15 | DRG: 885 | Disposition: A | Discharge status: Home / Self Care | Payer: BC Managed Care – PPO | Source: Intra-hospital | Attending: Psychiatry | Admitting: Psychiatry

## 2014-07-11 DIAGNOSIS — F3130 Bipolar disorder, current episode depressed, mild or moderate severity, unspecified: Secondary | ICD-10-CM

## 2014-07-11 DIAGNOSIS — R45851 Suicidal ideations: Secondary | ICD-10-CM | POA: Insufficient documentation

## 2014-07-11 DIAGNOSIS — F1420 Cocaine dependence, uncomplicated: Secondary | ICD-10-CM | POA: Diagnosis not present

## 2014-07-11 DIAGNOSIS — F4310 Post-traumatic stress disorder, unspecified: Secondary | ICD-10-CM | POA: Diagnosis present

## 2014-07-11 DIAGNOSIS — Z79899 Other long term (current) drug therapy: Secondary | ICD-10-CM | POA: Diagnosis not present

## 2014-07-11 DIAGNOSIS — F1410 Cocaine abuse, uncomplicated: Secondary | ICD-10-CM | POA: Diagnosis present

## 2014-07-11 DIAGNOSIS — F319 Bipolar disorder, unspecified: Secondary | ICD-10-CM | POA: Diagnosis present

## 2014-07-11 DIAGNOSIS — F1320 Sedative, hypnotic or anxiolytic dependence, uncomplicated: Secondary | ICD-10-CM | POA: Diagnosis not present

## 2014-07-11 DIAGNOSIS — F17210 Nicotine dependence, cigarettes, uncomplicated: Secondary | ICD-10-CM | POA: Diagnosis present

## 2014-07-11 DIAGNOSIS — F314 Bipolar disorder, current episode depressed, severe, without psychotic features: Secondary | ICD-10-CM | POA: Diagnosis not present

## 2014-07-11 MED ORDER — GABAPENTIN 400 MG PO CAPS
400.0000 mg | ORAL_CAPSULE | Freq: Three times a day (TID) | ORAL | Status: DC
  Administered 2014-07-11 – 2014-07-15 (×12): 400 mg via ORAL
  Filled 2014-07-11 (×19): qty 1

## 2014-07-11 MED ORDER — LEVOTHYROXINE SODIUM 50 MCG PO TABS
50.0000 ug | ORAL_TABLET | Freq: Every day | ORAL | Status: DC
  Administered 2014-07-12 – 2014-07-15 (×4): 50 ug via ORAL
  Filled 2014-07-11 (×3): qty 1
  Filled 2014-07-11: qty 2
  Filled 2014-07-11 (×3): qty 1

## 2014-07-11 MED ORDER — ACETAMINOPHEN 325 MG PO TABS: 650.0000 mg | ORAL_TABLET | Freq: Four times a day (QID) | ORAL | Status: DC | PRN

## 2014-07-11 MED ORDER — VILAZODONE HCL 40 MG PO TABS
40.0000 mg | ORAL_TABLET | Freq: Every day | ORAL | Status: DC
  Administered 2014-07-12 – 2014-07-15 (×4): 40 mg via ORAL
  Filled 2014-07-11 (×6): qty 1

## 2014-07-11 MED ORDER — MAGNESIUM HYDROXIDE 400 MG/5ML PO SUSP: 30.0000 mL | Freq: Every day | ORAL | Status: DC | PRN

## 2014-07-11 MED ORDER — SUMATRIPTAN SUCCINATE 50 MG PO TABS: 100.0000 mg | ORAL_TABLET | ORAL | Status: DC | PRN

## 2014-07-11 MED ORDER — HYDROXYZINE HCL 50 MG/ML IM SOLN: 50.0000 mg | Freq: Once | INTRAMUSCULAR | Status: DC

## 2014-07-11 MED ORDER — VILAZODONE HCL 20 MG PO TABS: 40.0000 mg | ORAL_TABLET | Freq: Every day | ORAL | Status: DC

## 2014-07-11 MED ORDER — ALUM & MAG HYDROXIDE-SIMETH 200-200-20 MG/5ML PO SUSP: 30.0000 mL | ORAL | Status: DC | PRN

## 2014-07-11 MED ORDER — HYDROXYZINE HCL 50 MG PO TABS
50.0000 mg | ORAL_TABLET | Freq: Once | ORAL | Status: AC
  Administered 2014-07-11: 50 mg via ORAL
  Filled 2014-07-11 (×2): qty 1

## 2014-07-11 MED ORDER — TOPIRAMATE 25 MG PO TABS
150.0000 mg | ORAL_TABLET | Freq: Every day | ORAL | Status: DC
  Administered 2014-07-11 – 2014-07-14 (×4): 150 mg via ORAL
  Filled 2014-07-11 (×2): qty 2
  Filled 2014-07-11: qty 1
  Filled 2014-07-11: qty 2
  Filled 2014-07-11: qty 1
  Filled 2014-07-11 (×5): qty 2

## 2014-07-11 MED ORDER — NICOTINE 21 MG/24HR TD PT24
21.0000 mg | MEDICATED_PATCH | Freq: Every day | TRANSDERMAL | Status: DC
  Administered 2014-07-12 – 2014-07-15 (×4): 21 mg via TRANSDERMAL
  Filled 2014-07-11 (×8): qty 1

## 2014-07-11 MED ORDER — ASENAPINE MALEATE 5 MG SL SUBL
10.0000 mg | SUBLINGUAL_TABLET | Freq: Two times a day (BID) | SUBLINGUAL | Status: DC
  Administered 2014-07-11 – 2014-07-12 (×2): 10 mg via SUBLINGUAL
  Filled 2014-07-11 (×6): qty 2

## 2014-07-11 MED ORDER — PNEUMOCOCCAL VAC POLYVALENT 25 MCG/0.5ML IJ INJ
0.5000 mL | INJECTION | INTRAMUSCULAR | Status: AC
  Administered 2014-07-12: 0.5 mL via INTRAMUSCULAR

## 2014-07-11 MED ORDER — AMANTADINE HCL 100 MG PO CAPS
100.0000 mg | ORAL_CAPSULE | Freq: Two times a day (BID) | ORAL | Status: DC
  Administered 2014-07-11 – 2014-07-15 (×8): 100 mg via ORAL
  Filled 2014-07-11 (×14): qty 1
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
## 2014-07-11 NOTE — BH Assessment (Addendum)
[facility] Assessment Progress Note  Per Waylan Boga, NP, this pt requires psychiatric hospitalization at this time.  Debarah Crape, RN, [facility] has assigned pt to [facility] Rm [id number] with a caveat that she is not to be transported until after 15:00.  Pt has signed Voluntary Admission and Consent for Treatment, as well as Consent to Release Information to [facility] and [place], and a notification call has been placed.  Signed forms have been faxed to [facility].  Pt's nurse, Nena Jordan, has been notified, and agrees to send original paperwork along with pt via [facility], and to call report to [phone].  Jalene Mullet, MA Triage Specialist [phone]   Addendum:  It was subsequently found that pt is under IVC initiated by EDP Jola Schmidt, MD.  IVC paperwork has been faxed to [facility].  Pt will be transported via Event organiser.  Pt's nurse, Nena Jordan, has been notified.  Jalene Mullet, MA Triage Specialist [phone]
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
## 2014-07-11 NOTE — Tx Team (Addendum)
Initial Interdisciplinary Treatment Plan   PATIENT STRESSORS: Medication change or noncompliance Substance abuse   PATIENT STRENGTHS: Ability for insight Capable of independent living Supportive family/friends   PROBLEM LIST: Problem List/Patient Goals Date to be addressed Date deferred Reason deferred Estimated date of resolution  Depression  07/11/14     Substance abuse 07/11/14     "i just want to be happy" 07/12/2014     Med. adjustment 07/12/2014                                    DISCHARGE CRITERIA:  Ability to meet basic life and health needs Adequate post-discharge living arrangements Improved stabilization in mood, thinking, and/or behavior  PRELIMINARY DISCHARGE PLAN: Attend aftercare/continuing care group Attend PHP/IOP  PATIENT/FAMIILY INVOLVEMENT: This treatment plan has been presented to and reviewed with the patient, Lindsey Davenport, and/or family member.  The patient and family have been given the opportunity to ask questions and make suggestions.  White, Patrice L 07/11/2014, 6:45 PM

## 2014-07-11 NOTE — Progress Notes (Signed)
Admission note: Data gathered from patient. Pt reported that she's been binging on crack and using 200.00 worth every two days. Pt reported that she's been feeling depressed and suicidal. Pt do not feel like her meds are effective. Pt denies suicidal ideations at this time. Pt denies drinking alcohol and reported that she's been sober for more than five years. Pt reported using crack since age 34 yrs old.  Pt skin assessment completed by Mardene Celeste, RN. VS and belongings completed by Lavella Lemons, MHT.  Admission and documents completed by Sharl Ma, RN.

## 2014-07-12 ENCOUNTER — Encounter (HOSPITAL_COMMUNITY): Payer: Self-pay | Admitting: Psychiatry

## 2014-07-12 DIAGNOSIS — F4310 Post-traumatic stress disorder, unspecified: Secondary | ICD-10-CM | POA: Diagnosis present

## 2014-07-12 DIAGNOSIS — F1410 Cocaine abuse, uncomplicated: Secondary | ICD-10-CM

## 2014-07-12 DIAGNOSIS — F314 Bipolar disorder, current episode depressed, severe, without psychotic features: Secondary | ICD-10-CM

## 2014-07-12 MED ORDER — LOPERAMIDE HCL 2 MG PO CAPS
2.0000 mg | ORAL_CAPSULE | ORAL | Status: AC | PRN
Start: 2014-07-12 — End: 2014-07-15

## 2014-07-12 MED ORDER — NEOMYCIN-POLYMYXIN-HC 1 % OT SOLN: 3.0000 [drp] | Freq: Four times a day (QID) | OTIC | Status: DC

## 2014-07-12 MED ORDER — LORAZEPAM 1 MG PO TABS
1.0000 mg | ORAL_TABLET | Freq: Two times a day (BID) | ORAL | Status: AC
  Administered 2014-07-14 (×2): 1 mg via ORAL
  Filled 2014-07-12 (×2): qty 1

## 2014-07-12 MED ORDER — LORAZEPAM 1 MG PO TABS
1.0000 mg | ORAL_TABLET | Freq: Four times a day (QID) | ORAL | Status: AC
  Administered 2014-07-12 (×3): 1 mg via ORAL
  Filled 2014-07-12 (×3): qty 1

## 2014-07-12 MED ORDER — HYDROXYZINE HCL 25 MG PO TABS
25.0000 mg | ORAL_TABLET | Freq: Four times a day (QID) | ORAL | Status: AC | PRN
  Administered 2014-07-12 (×2): 25 mg via ORAL
  Filled 2014-07-12 (×2): qty 1

## 2014-07-12 MED ORDER — LORAZEPAM 1 MG PO TABS
1.0000 mg | ORAL_TABLET | Freq: Three times a day (TID) | ORAL | Status: AC
  Administered 2014-07-13 (×3): 1 mg via ORAL
  Filled 2014-07-12 (×3): qty 1

## 2014-07-12 MED ORDER — LORAZEPAM 1 MG PO TABS
1.0000 mg | ORAL_TABLET | Freq: Every day | ORAL | Status: AC
  Administered 2014-07-15: 1 mg via ORAL
  Filled 2014-07-12: qty 1

## 2014-07-12 MED ORDER — ONDANSETRON 4 MG PO TBDP: 4.0000 mg | ORAL_TABLET | Freq: Four times a day (QID) | ORAL | Status: AC | PRN

## 2014-07-12 MED ORDER — ASENAPINE MALEATE 5 MG SL SUBL
5.0000 mg | SUBLINGUAL_TABLET | Freq: Two times a day (BID) | SUBLINGUAL | Status: DC
  Administered 2014-07-12 – 2014-07-15 (×6): 5 mg via SUBLINGUAL
  Filled 2014-07-12 (×10): qty 1

## 2014-07-12 MED ORDER — LORAZEPAM 1 MG PO TABS
1.0000 mg | ORAL_TABLET | Freq: Four times a day (QID) | ORAL | Status: AC | PRN
  Administered 2014-07-12: 1 mg via ORAL
  Filled 2014-07-12: qty 1

## 2014-07-12 NOTE — B[facility] Group Notes (Signed)
[facility] LCSW Group Therapy 07/12/2014 1:15pm  Type of Therapy: Group Therapy- Feelings Around Relapse and Recovery  Pt came to group but was called out early by physician and did not return.    Peri Maris, Latanya Presser [phone] 07/12/2014 6:21 PM

## 2014-07-12 NOTE — Tx Team (Signed)
Interdisciplinary Treatment Plan Update (Adult) Date: 07/12/2014   Date: 07/12/2014 6:09 PM  Progress in Treatment:  Attending groups: Yes  Participating in groups: Yes  Taking medication as prescribed: Yes  Tolerating medication: Yes  Family/Significant othe contact made: No, CSW to assess for appropriate contact Patient understands diagnosis: Yes Discussing patient identified problems/goals with staff: Yes  Medical problems stabilized or resolved: Yes  Denies suicidal/homicidal ideation: Yes Patient has not harmed self or Others: Yes   New problem(s) identified:   Discharge Plan or Barrier: Pt will return home with her husband and follow up with [facility]. No barriers at this time.  Additional comments:   Reason for Continuation of Hospitalization:  Anxiety Depression Suicidal ideation Withdrawal symptoms   Estimated length of stay: 3-5 days  For review of initial/current patient goals, please see plan of care.   Attendees:  Patient:    Family:    Physician: Dr. Sabra Heck MD  07/12/2014 6:09 PM  Nursing: Lars Pinks, RN Case manager  07/12/2014 6:09 PM  Clinical Social Worker Norman Clay, MSW 07/12/2014 6:09 PM  Other: Lucinda Dell, Beverly Sessions Liasion 07/12/2014 6:09 PM  Clinical: Kirkland Hun, RN; Grayland Ormond, RN 07/12/2014 6:09 PM  Other: , RN Charge Nurse 07/12/2014 6:09 PM  Other:     Peri Maris, Latanya Presser MSW

## 2014-07-12 NOTE — Progress Notes (Signed)
D.  Pt anxious on approach, denies other complaints at this time.  Positive for evening AA group, interacting appropriately with peers on the unit.  Denies SI/HI/hallucinations at this time.  A.  Support and encouragement offered, medication given as ordered for anxiety  R.  Pt remains safe on the unit, will continue to monitor.

## 2014-07-12 NOTE — [facility] Group Notes (Signed)
[facility] Group Notes:  (Nursing/MHT/Case Management/Adjunct)  Date:  07/12/2014  Time: 10:00 AM   Type of Therapy:  Psychoeducational Skills  Participation Level:  Active  Participation Quality:  Appropriate and Supportive  Affect:  Appropriate  Cognitive:  Alert and Appropriate  Insight:  Appropriate and Good  Engagement in Group:  Engaged and Supportive  Modes of Intervention:  Education  Summary of Progress/Problems: Pt. Confirmed intentions to avoid relapsing.  Pt. Identified coping skills, such as spending time with her family.   Leroy Sea N 07/12/2014, 12:32 PM

## 2014-07-12 NOTE — Plan of Care (Signed)
Problem: Consults Goal: Suicide Risk Patient Education (See Patient Education module for education specifics)  Outcome: Completed/Met Date Met:  07/12/14 Nurse discussed suicidal thoughts/coping skills with patient.     

## 2014-07-12 NOTE — [facility] Group Notes (Signed)
[facility] LCSW Aftercare Discharge Planning Group Note  07/12/2014 8:45 AM  Participation Quality: Alert, Appropriate and Oriented  Mood/Affect: Flat and Depressed  Depression Rating: 7  Anxiety Rating: 7  Thoughts of Suicide: Pt denies SI/HI  Will you contract for safety? Yes  Current AVH: Pt denies  Plan for Discharge/Comments: Pt attended discharge planning group and actively participated in group. CSW provided pt with today's workbook. Pt participated appropriately, reporting that she feels depressed this morning and is still getting acclimated to the unit. Pt is established at [facility] and has an appointment next week. She plans to return to her home in [place].   Transportation Means: Pt reports access to transportation  Supports: No supports mentioned at this time  Lindsey Davenport, Aline 07/12/2014 10:24 AM
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
## 2014-07-12 NOTE — Progress Notes (Signed)
Pt attended karaoke in [employer].

## 2014-07-12 NOTE — Progress Notes (Signed)
Patient ID: Lindsey Davenport, female   DOB: May 29, 1980, 34 y.o.   MRN: [id number] D: Patient in room on approach. Pt mood and affect appeared depressed and flat. Pt rated depression and anxiety as 8 as of 0-10 scale. Pt reports goal after discharge is "I want to be happy". Pt denies SI/HI/AVH and pain. Cooperative with assessment. No acute distressed noted at this time. A: Met with pt 1:1. Medications administered as prescribed. Support and encouragement provider to attend groups and engage in milieu. Pt encouraged to discuss feelings and come to staff with any question or concerns.  R: Patient remains safe. She is complaint with medications.
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
## 2014-07-12 NOTE — Progress Notes (Signed)
D:  Patient's self inventory sheet, patient sleeps fair, sleep medication was not helpful.  Poor appetite, low energy level, poor concentration.  Rated depression, hopeless and anxiety #8.  Withdrawals of tremors, cravings, agitation,  Irritability.  SI off/on, contracts for safety.  Denied physical problems.  Denied pain.  Goal is to get medication right.  Plans to talk to MD.  May need to cancel Tuesday appointment with MD. A:  Medications administered per MD orders.  Emotional support and encouragement given patient. R:  Denied SI and HI while talking to nurse this morning, contracts for safety.  On self inventory sheet, patient stated she was SI.  Denied A/V hallucinations.  Safety maintained with 15 minute checks.

## 2014-07-12 NOTE — Progress Notes (Signed)
Recreation Therapy Notes  Date: 06.10.16 Time: 9:30 am Location: [address] Group Room  Group Topic: Stress Management  Goal Area(s) Addresses:  Patient will verbalize importance of using healthy stress management.  Patient will identify positive emotions associated with healthy stress management.   Behavioral Response:  Engaged  Intervention: Stress Management  Activity :  Progressive Muscle Relaxation.  LRT introduced and educated patients on stress management technique of progressive muscle relaxation.  A script was used to deliver the technique to patients.  Patients were asked to follow script read a loud by LRT to engage in practicing the stress management technique.  Education:  Stress Management, Discharge Planning.   Education Outcome:  Acknowledges education/In group clarification offered  Clinical Observations/Feedback: Patient attended group.   Victorino Sparrow, LRT/CTRS         Victorino Sparrow A 07/12/2014 3:32 PM

## 2014-07-12 NOTE — Plan of Care (Signed)
Problem: Alteration in mood Goal: STG-Patient is able to discuss feelings and issues (Patient is able to discuss feelings and issues leading to depression)  Outcome: Progressing Pt discussed with writer her goal after discharge which is to be happy.

## 2014-07-13 DIAGNOSIS — F3130 Bipolar disorder, current episode depressed, mild or moderate severity, unspecified: Principal | ICD-10-CM

## 2014-07-13 DIAGNOSIS — F1320 Sedative, hypnotic or anxiolytic dependence, uncomplicated: Secondary | ICD-10-CM

## 2014-07-13 DIAGNOSIS — R45851 Suicidal ideations: Secondary | ICD-10-CM

## 2014-07-13 LAB — LIPID PANEL
Cholesterol: 202 mg/dL — ABNORMAL HIGH (ref 0–200)
HDL: 42 mg/dL (ref >40)
LDL Cholesterol: 144 mg/dL — ABNORMAL HIGH (ref 0–99)
Total CHOL/HDL Ratio: 4.8 RATIO
Triglycerides: 79 mg/dL (ref <150)
VLDL: 16 mg/dL (ref 0–40)

## 2014-07-13 LAB — TSH: TSH: 1.502 u[IU]/mL (ref 0.350–4.500)

## 2014-07-13 MED ORDER — TRAZODONE HCL 50 MG PO TABS: 50.0000 mg | ORAL_TABLET | Freq: Every day | ORAL | Status: DC

## 2014-07-13 MED ORDER — ARIPIPRAZOLE 5 MG PO TABS
5.0000 mg | ORAL_TABLET | Freq: Every day | ORAL | Status: DC
  Administered 2014-07-13 – 2014-07-14 (×2): 5 mg via ORAL
  Filled 2014-07-13 (×5): qty 1

## 2014-07-13 MED ORDER — TRAZODONE HCL 50 MG PO TABS
50.0000 mg | ORAL_TABLET | Freq: Every evening | ORAL | Status: DC | PRN
  Administered 2014-07-13 – 2014-07-14 (×2): 50 mg via ORAL
  Filled 2014-07-13 (×10): qty 1

## 2014-07-13 NOTE — Progress Notes (Signed)
D: Pt's mood is depressed. Eye contact is fair. States that she is ready to be discharged because she misses her kids. Denies SI/HI/AVH.  A: Support given. Verbalization encouraged. Pt encouraged to come to staff for any concerns. Medications given as prescribed. R: Pt is receptive. No complaints of pain or discomfort at this time. Q15 min safety checks maintained. Pt remains safe on the unit. Will continue to monitor.

## 2014-07-13 NOTE — BHH Group Notes (Signed)
[facility] Group Notes: (Clinical Social Work)  07/13/2014 10-11AM  Summary of Progress/Problems: The main focus of today's process group was to identify needs, identify common unhealthy coping skills and talk about how those unhealthy coping skills are learned and how they "benefit" people. Participants could then learn how to use a decisional balance exercise to move forward in the Stages of Change. Motivational Interviewing and the white board were utilized to help patients explore in depth the perceived benefits and costs of unhealthy coping techniques, as well as the benefits and costs of replacing that with healthy coping skills. The patient expressed that typical unhealthy coping involves drugs, anger, lying, withdrawal, unprotected sex, spending money.  She stated she got a lot out of group.  Type of Therapy: Group Therapy - Process   Participation Level: Active  Participation Quality: Attentive, Sharing and Supportive  Affect: Flat and Depressed  Cognitive: Alert, Appropriate and Oriented  Insight: Engaged  Engagement in Therapy: Engaged  Modes of Intervention: Education, Motivational Interviewing  Selmer Dominion, LCSW 07/13/2014, 11:54 AM

## 2014-07-13 NOTE — Progress Notes (Signed)
D.  Pt with flat affect on approach, watching TV with peers.  Minimal interacting.  Positive for evening wrap up group, see group note.  Denies SI/HI/hallucinatons at this time.  Denies complaints of at this time.  A.  Support and encouragement offered  R.  Pt remains safe on unit, will continue to monitor.
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
## 2014-07-13 NOTE — BHH Group Notes (Signed)
The focus of this group is to educate the patient on the purpose and policies of crisis stabilization and provide a format to answer questions about their admission.  The group details unit policies and expectations of patients while admitted.  Patient attended 0900 nurse education orientation group this morning.  Patient actively participated, appropriate affect, alert, appropriate insight and engagement.  

## 2014-07-13 NOTE — Progress Notes (Signed)
The focus of this group is to help patients review their daily goal of treatment and discuss progress on daily workbooks. Lindsey Davenport attended the evening group session but responded minimally to discussion prompts from the [place]. Lorilynn shared that she had a good day and was "grateful for waking up this morning," but would not elaborate on her day beyond this. Lindsey Davenport reported having no additional requests from Nursing Staff this evening. Lindsey Davenport's affect was flat.
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
## 2014-07-14 NOTE — Progress Notes (Signed)
D.  Pt appears brighter today than past two evenings, states she is hopeful to go home tomorrow and see her children.  Pt was positive for evening AA group, interacting appropriately with peers on the unit.  Denies SI/HI/hallucinaitons at this time.  A.  Support and encouragement offered  R.  Pt remains safe on unit, will continue to monitor.

## 2014-07-14 NOTE — Progress Notes (Signed)
D: Pt's mood is depressed, flat affect. Eye contact is fair. She denies SI/HI/AVH.  A: Support given. Verbalization encouraged. Pt encouraged to come to staff for any concerns. Medications given as prescribed. R: Pt is receptive. No complaints of pain or discomfort at this time. Q15 min safety checks maintained. Pt remains safe on the unit. Will continue to monitor.
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
## 2014-07-14 NOTE — [facility] Group Notes (Signed)
[facility] Group Notes:  (Clinical Social Work)  07/14/2014  10:00-11:00AM  Summary of Progress/Problems:   The main focus of today's process group was to   1)  discuss the importance of adding supports  2)  define health supports versus unhealthy supports  3)  identify the patient's current unhealthy supports and plan how to handle them  4)  Identify the patient's current healthy supports and plan what to add.  An emphasis was placed on using counselor, doctor, therapy groups, 12-step groups, and problem-specific support groups to expand supports.    The patient expressed full comprehension of the concepts presented, and agreed that there is a need to add more supports.  The patient stated little during group, but her affect did become less blunted at times and she was more engaged in group today than yesterday.  Type of Therapy:  Process Group with Motivational Interviewing  Participation Level:  Active  Participation Quality:  Attentive, Sharing and Supportive  Affect:  Anxious and Blunted  Cognitive:  Alert  Insight:  Engaged  Engagement in Therapy:  Engaged  Modes of Intervention:   Education, Support and Processing, Activity  Selmer Dominion, LCSW 07/14/2014

## 2014-07-14 NOTE — [facility] Group Notes (Signed)
The focus of this group is to educate the patient on the purpose and policies of crisis stabilization and provide a format to answer questions about their admission.  The group details unit policies and expectations of patients while admitted.  Patient attended 0900 nurse education orientation group this morning.  Patient actively participated, appropriate affect, alert, appropriate insight and engagement.  Today patient will work on 3 goals for discharge.  

## 2014-07-14 NOTE — Progress Notes (Signed)
Patient did attend the evening speaker AA meeting.  

## 2014-07-15 LAB — HEMOGLOBIN A1C
Hgb A1c MFr Bld: 5.4 % (ref 4.8–5.6)
MEAN PLASMA GLUCOSE: 108 mg/dL

## 2014-07-15 MED ORDER — NICOTINE 21 MG/24HR TD PT24: 21.0000 mg | MEDICATED_PATCH | Freq: Every day | TRANSDERMAL | Status: DC

## 2014-07-15 MED ORDER — TRAZODONE HCL 50 MG PO TABS: 50.0000 mg | ORAL_TABLET | Freq: Every evening | ORAL | Status: DC | PRN

## 2014-07-15 MED ORDER — LEVOTHYROXINE SODIUM 50 MCG PO TABS: 50.0000 ug | ORAL_TABLET | Freq: Every day | ORAL | Status: DC

## 2014-07-15 MED ORDER — TOPIRAMATE 100 MG PO TABS
150.0000 mg | ORAL_TABLET | Freq: Every day | ORAL | Status: DC
  Filled 2014-07-15: qty 5

## 2014-07-15 MED ORDER — VILAZODONE HCL 20 MG PO TABS
40.0000 mg | ORAL_TABLET | Freq: Every day | ORAL | Status: DC
  Filled 2014-07-15: qty 4

## 2014-07-15 MED ORDER — ASENAPINE MALEATE 5 MG SL SUBL: 5.0000 mg | SUBLINGUAL_TABLET | Freq: Two times a day (BID) | SUBLINGUAL | Status: DC

## 2014-07-15 MED ORDER — TOPIRAMATE 50 MG PO TABS: 150.0000 mg | ORAL_TABLET | Freq: Every day | ORAL | Status: DC

## 2014-07-15 MED ORDER — GABAPENTIN 400 MG PO CAPS: 400.0000 mg | ORAL_CAPSULE | Freq: Three times a day (TID) | ORAL | Status: DC

## 2014-07-15 MED ORDER — ARIPIPRAZOLE 5 MG PO TABS: 5.0000 mg | ORAL_TABLET | Freq: Every day | ORAL | Status: DC

## 2014-07-15 MED ORDER — VILAZODONE HCL 40 MG PO TABS: 40.0000 mg | ORAL_TABLET | Freq: Every day | ORAL | Status: DC

## 2014-07-15 MED ORDER — AMANTADINE HCL 100 MG PO CAPS: 100.0000 mg | ORAL_CAPSULE | Freq: Two times a day (BID) | ORAL | Status: DC

## 2014-07-15 MED ORDER — SUMATRIPTAN SUCCINATE 100 MG PO TABS: 100.0000 mg | ORAL_TABLET | ORAL | Status: DC | PRN
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
## 2014-07-15 NOTE — [facility] Suicide Risk Assessment (Signed)
[name] INPATIENT:  Family/Significant Other Suicide Prevention Education  Suicide Prevention Education:  Education Completed; [employer], Pt's husband, has been identified by the patient as the family member/significant other with whom the patient will be residing, and identified as the person(s) who will aid the patient in the event of a mental health crisis (suicidal ideations/suicide attempt).  With written consent from the patient, the family member/significant other has been provided the following suicide prevention education, prior to the and/or following the discharge of the patient.  The suicide prevention education provided includes the following:  Suicide risk factors  Suicide prevention and interventions  National Suicide Hotline telephone number  [facility] assessment telephone number  [facility] Emergency Assistance [place] and/or Residential Mobile Crisis Unit telephone number  Request made of family/significant other to:  Remove weapons (e.g., guns, rifles, knives), all items previously/currently identified as safety concern.    Remove drugs/medications (over-the-counter, prescriptions, illicit drugs), all items previously/currently identified as a safety concern.  The family member/significant other verbalizes understanding of the suicide prevention education information provided.  The family member/significant other agrees to remove the items of safety concern listed above.  Lindsey Davenport 07/15/2014, 10:19 AM

## 2014-07-15 NOTE — Progress Notes (Signed)
D: Pt's mood is depressed but she brightens somewhat. She denies SI/HI/AVH Rates her depression 4/10, hopelessness 4/10, and anxiety 5/10.  A: Support given. Verbalization encouraged. Pt encouraged to come to staff for any concerns. Medications given as prescribed. R: Pt is receptive. No complaints of pain or discomfort at this time. Q15 min safety checks maintained. Pt remains safe on the unit. Will continue to monitor.

## 2014-07-15 NOTE — Plan of Care (Signed)
Problem: Diagnosis: Increased Risk For Suicide Attempt Goal: LTG-Patient Will Report Improved Mood and Deny Suicidal LTG (by discharge) Patient will report improved mood and deny suicidal ideation.  Outcome: Progressing Pt with brighter affect this evening, denies suicidal ideation.  Stated she is looking forward to seeing her children

## 2014-07-15 NOTE — Progress Notes (Signed)
Recreation Therapy Notes  Date: 06.13.16 Time: 9:30 am Location: [address] Group Room  Group Topic: Stress Management  Goal Area(s) Addresses:  Patient will verbalize importance of using healthy stress management.  Patient will identify positive emotions associated with healthy stress management.   Intervention: Stress Management  Activity : Guided Imagery. LRT introduced and educated patients on stress management technique of guided imagery. A script was used to deliver the techniques to patients. Patients were asked to follow script real aloud by LRT to engage in practicing guided imagery.  Education: Stress Management, Discharge Planning.   Clinical Observations/Feedback: Patient did not attend group.    Victorino Sparrow, LRT/CTRS        Ria Comment, Kiyanna Biegler A 07/15/2014 1:27 PM

## 2014-07-15 NOTE — Progress Notes (Signed)
Discharge orders received. Pt. Agreeable to discharge. AVS and prescriptions provided and reviewed. Pt. Verbalizes understanding of discharge instructions. Pt. Offers no questions or concerns. Pt. Denies SI, HI, pain or discomfort. Pt. Belongings returned and signed for. Pt. Escorted to lobby to meet family.

## 2014-07-15 NOTE — Progress Notes (Signed)
  [facility] Adult Case Management Discharge Plan :  Will you be returning to the same living situation after discharge:  Yes,  will return home with husband At discharge, do you have transportation home?: Yes,  husband to provide transportation Do you have the ability to pay for your medications: Yes,  Pt provided with samples and prescriptions  Release of information consent forms completed and in the chart;  Patient's signature needed at discharge.  Patient to Follow up at: Follow-up Information    Follow up with [facility] On 07/16/2014.   Why:  at 10:30am with Dr. Casimiro Needle for medication management.    Contact information:   [place]  [address] [phone]      Follow up with [facility] On 07/22/2014.   Why:  at 10:00am with your therapist, Jeani Hawking.    Contact information:   [place]  [address] [phone]      Patient denies SI/HI: Yes,  Pt denies    Safety Planning and Suicide Prevention discussed: Yes,  with husband. See SPE note for futher details  Have you used any form of tobacco in the last 30 days? (Cigarettes, Smokeless Tobacco, Cigars, and/or Pipes): Yes  Has patient been referred to the Quitline?: Patient refused referral  Bo Mcclintock 07/15/2014, 10:17 AM
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
## 2014-07-15 NOTE — [facility] Group Notes (Signed)
[facility] LCSW Aftercare Discharge Planning Group Note  07/15/2014 8:45 AM  Participation Quality: Alert, Appropriate and Oriented  Mood/Affect: Flat  Depression Rating: 4  Anxiety Rating: 4  Thoughts of Suicide: Pt denies SI/HI  Will you contract for safety? Yes  Current AVH: Pt denies  Plan for Discharge/Comments: Pt attended discharge planning group and actively participated in group. CSW discussed suicide prevention education with the group and encouraged them to discuss discharge planning and any relevant barriers. Pt reports improving mood and describes being ready for discharge. Pt has an appointment at [facility] tomorrow and will return home with husband today at discharge.  Transportation Means: Pt reports access to transportation  Supports: No supports mentioned at this time  Peri Maris, [place] 07/15/2014 10:00 AM\

## 2014-09-09 ENCOUNTER — Emergency Department (HOSPITAL_COMMUNITY): Payer: BC Managed Care – PPO

## 2014-09-09 ENCOUNTER — Encounter (HOSPITAL_COMMUNITY): Payer: Self-pay | Admitting: Emergency Medicine

## 2014-09-09 ENCOUNTER — Inpatient Hospital Stay (HOSPITAL_COMMUNITY)
Admission: EM | Admit: 2014-09-09 | Discharge: 2014-09-13 | DRG: 964 | Disposition: A | Discharge status: Home Health | Payer: BC Managed Care – PPO | Attending: General Surgery | Admitting: General Surgery

## 2014-09-09 DIAGNOSIS — E039 Hypothyroidism, unspecified: Secondary | ICD-10-CM | POA: Diagnosis present

## 2014-09-09 DIAGNOSIS — S32039A Unspecified fracture of third lumbar vertebra, initial encounter for closed fracture: Secondary | ICD-10-CM | POA: Diagnosis present

## 2014-09-09 DIAGNOSIS — F17210 Nicotine dependence, cigarettes, uncomplicated: Secondary | ICD-10-CM | POA: Diagnosis present

## 2014-09-09 DIAGNOSIS — S2242XA Multiple fractures of ribs, left side, initial encounter for closed fracture: Secondary | ICD-10-CM | POA: Diagnosis present

## 2014-09-09 DIAGNOSIS — S36116A Major laceration of liver, initial encounter: Secondary | ICD-10-CM | POA: Diagnosis present

## 2014-09-09 DIAGNOSIS — F4310 Post-traumatic stress disorder, unspecified: Secondary | ICD-10-CM | POA: Diagnosis present

## 2014-09-09 DIAGNOSIS — F319 Bipolar disorder, unspecified: Secondary | ICD-10-CM | POA: Diagnosis present

## 2014-09-09 DIAGNOSIS — S43101A Unspecified dislocation of right acromioclavicular joint, initial encounter: Secondary | ICD-10-CM | POA: Diagnosis present

## 2014-09-09 DIAGNOSIS — S270XXA Traumatic pneumothorax, initial encounter: Principal | ICD-10-CM | POA: Diagnosis present

## 2014-09-09 DIAGNOSIS — S32049A Unspecified fracture of fourth lumbar vertebra, initial encounter for closed fracture: Secondary | ICD-10-CM | POA: Diagnosis present

## 2014-09-09 DIAGNOSIS — R079 Chest pain, unspecified: Secondary | ICD-10-CM | POA: Diagnosis not present

## 2014-09-09 DIAGNOSIS — S36113A Laceration of liver, unspecified degree, initial encounter: Secondary | ICD-10-CM | POA: Diagnosis present

## 2014-09-09 DIAGNOSIS — G43909 Migraine, unspecified, not intractable, without status migrainosus: Secondary | ICD-10-CM | POA: Diagnosis present

## 2014-09-09 DIAGNOSIS — J939 Pneumothorax, unspecified: Secondary | ICD-10-CM

## 2014-09-09 DIAGNOSIS — Y92410 Unspecified street and highway as the place of occurrence of the external cause: Secondary | ICD-10-CM | POA: Diagnosis not present

## 2014-09-09 DIAGNOSIS — S43109A Unspecified dislocation of unspecified acromioclavicular joint, initial encounter: Secondary | ICD-10-CM

## 2014-09-09 LAB — CBC WITH DIFFERENTIAL/PLATELET
Basophils Absolute: 0 10*3/uL (ref 0.0–0.1)
Basophils Relative: 0 % (ref 0–1)
EOS ABS: 0.1 10*3/uL (ref 0.0–0.7)
Eosinophils Relative: 1 % (ref 0–5)
HCT: 44.5 % (ref 36.0–46.0)
Hemoglobin: 15 g/dL (ref 12.0–15.0)
LYMPHS ABS: 1.8 10*3/uL (ref 0.7–4.0)
LYMPHS PCT: 17 % (ref 12–46)
MCH: 31.4 pg (ref 26.0–34.0)
MCHC: 33.7 g/dL (ref 30.0–36.0)
MCV: 93.3 fL (ref 78.0–100.0)
Monocytes Absolute: 0.3 10*3/uL (ref 0.1–1.0)
Monocytes Relative: 3 % (ref 3–12)
NEUTROS ABS: 8.3 10*3/uL — AB (ref 1.7–7.7)
Neutrophils Relative %: 79 % — ABNORMAL HIGH (ref 43–77)
Platelets: 185 10*3/uL (ref 150–400)
RBC: 4.77 MIL/uL (ref 3.87–5.11)
RDW: 13.5 % (ref 11.5–15.5)
WBC: 10.5 10*3/uL (ref 4.0–10.5)

## 2014-09-09 LAB — BASIC METABOLIC PANEL
Anion gap: 9 (ref 5–15)
BUN: 5 mg/dL — ABNORMAL LOW (ref 6–20)
CALCIUM: 8.7 mg/dL — AB (ref 8.9–10.3)
CO2: 19 mmol/L — ABNORMAL LOW (ref 22–32)
Chloride: 110 mmol/L (ref 101–111)
Creatinine, Ser: 0.81 mg/dL (ref 0.44–1.00)
GLUCOSE: 127 mg/dL — AB (ref 65–99)
POTASSIUM: 4 mmol/L (ref 3.5–5.1)
Sodium: 138 mmol/L (ref 135–145)

## 2014-09-09 LAB — RAPID URINE DRUG SCREEN, HOSP PERFORMED
AMPHETAMINES: NOT DETECTED
Barbiturates: NOT DETECTED
Benzodiazepines: NOT DETECTED
COCAINE: NOT DETECTED
OPIATES: NOT DETECTED
Tetrahydrocannabinol: NOT DETECTED

## 2014-09-09 LAB — CBC
HCT: 41 % (ref 36.0–46.0)
HEMATOCRIT: 40.4 % (ref 36.0–46.0)
Hemoglobin: 14 g/dL (ref 12.0–15.0)
Hemoglobin: 13.6 g/dL (ref 12.0–15.0)
MCH: 31.6 pg (ref 26.0–34.0)
MCH: 31.1 pg (ref 26.0–34.0)
MCHC: 34.1 g/dL (ref 30.0–36.0)
MCHC: 33.7 g/dL (ref 30.0–36.0)
MCV: 92.6 fL (ref 78.0–100.0)
MCV: 92.4 fL (ref 78.0–100.0)
Platelets: 174 10*3/uL (ref 150–400)
Platelets: 169 10*3/uL (ref 150–400)
RBC: 4.43 MIL/uL (ref 3.87–5.11)
RBC: 4.37 MIL/uL (ref 3.87–5.11)
RDW: 13.6 % (ref 11.5–15.5)
RDW: 13.6 % (ref 11.5–15.5)
WBC: 9.1 10*3/uL (ref 4.0–10.5)
WBC: 6.9 10*3/uL (ref 4.0–10.5)

## 2014-09-09 LAB — MRSA PCR SCREENING: MRSA by PCR: NEGATIVE

## 2014-09-09 LAB — ETHANOL: Alcohol, Ethyl (B): <5 mg/dL (ref <5)

## 2014-09-09 LAB — POC URINE PREG, ED: Preg Test, Ur: NEGATIVE

## 2014-09-09 MED ORDER — ONDANSETRON HCL 4 MG PO TABS: 4.0000 mg | ORAL_TABLET | Freq: Four times a day (QID) | ORAL | Status: DC | PRN

## 2014-09-09 MED ORDER — FENTANYL CITRATE (PF) 100 MCG/2ML IJ SOLN
50.0000 ug | Freq: Once | INTRAMUSCULAR | Status: AC
  Administered 2014-09-09: 50 ug via INTRAVENOUS
  Filled 2014-09-09: qty 2

## 2014-09-09 MED ORDER — ASENAPINE MALEATE 5 MG SL SUBL
5.0000 mg | SUBLINGUAL_TABLET | Freq: Two times a day (BID) | SUBLINGUAL | Status: DC
  Filled 2014-09-09 (×2): qty 1

## 2014-09-09 MED ORDER — NICOTINE 21 MG/24HR TD PT24
21.0000 mg | MEDICATED_PATCH | Freq: Every day | TRANSDERMAL | Status: DC
  Administered 2014-09-11 – 2014-09-13 (×3): 21 mg via TRANSDERMAL
  Filled 2014-09-09 (×4): qty 1

## 2014-09-09 MED ORDER — SODIUM CHLORIDE 0.9 % IV BOLUS (SEPSIS)
1000.0000 mL | Freq: Once | INTRAVENOUS | Status: AC
  Administered 2014-09-09: 1000 mL via INTRAVENOUS

## 2014-09-09 MED ORDER — VILAZODONE HCL 40 MG PO TABS: 40.0000 mg | ORAL_TABLET | Freq: Every day | ORAL | Status: DC

## 2014-09-09 MED ORDER — SUMATRIPTAN SUCCINATE 100 MG PO TABS
100.0000 mg | ORAL_TABLET | ORAL | Status: DC | PRN
  Filled 2014-09-09: qty 1

## 2014-09-09 MED ORDER — LEVOTHYROXINE SODIUM 50 MCG PO TABS
50.0000 ug | ORAL_TABLET | Freq: Every day | ORAL | Status: DC
  Administered 2014-09-10 – 2014-09-13 (×4): 50 ug via ORAL
  Filled 2014-09-09 (×6): qty 1

## 2014-09-09 MED ORDER — AMANTADINE HCL 100 MG PO CAPS
100.0000 mg | ORAL_CAPSULE | Freq: Two times a day (BID) | ORAL | Status: DC
  Filled 2014-09-09 (×2): qty 1

## 2014-09-09 MED ORDER — TRAZODONE HCL 50 MG PO TABS
50.0000 mg | ORAL_TABLET | Freq: Every evening | ORAL | Status: DC | PRN
  Administered 2014-09-09 – 2014-09-10 (×3): 50 mg via ORAL
  Filled 2014-09-09 (×6): qty 1

## 2014-09-09 MED ORDER — ONDANSETRON HCL 4 MG/2ML IJ SOLN
4.0000 mg | Freq: Four times a day (QID) | INTRAMUSCULAR | Status: DC | PRN
  Administered 2014-09-09: 4 mg via INTRAVENOUS
  Filled 2014-09-09: qty 2

## 2014-09-09 MED ORDER — MORPHINE SULFATE 2 MG/ML IJ SOLN
2.0000 mg | INTRAMUSCULAR | Status: DC | PRN
  Administered 2014-09-09 – 2014-09-11 (×12): 2 mg via INTRAVENOUS
  Filled 2014-09-09 (×12): qty 1

## 2014-09-09 MED ORDER — GABAPENTIN 400 MG PO CAPS
400.0000 mg | ORAL_CAPSULE | Freq: Three times a day (TID) | ORAL | Status: DC
  Administered 2014-09-09 – 2014-09-13 (×13): 400 mg via ORAL
  Filled 2014-09-09 (×14): qty 1

## 2014-09-09 MED ORDER — ARIPIPRAZOLE 5 MG PO TABS
5.0000 mg | ORAL_TABLET | Freq: Every day | ORAL | Status: DC
  Filled 2014-09-09 (×2): qty 1

## 2014-09-09 MED ORDER — DEXTROSE-NACL 5-0.9 % IV SOLN
INTRAVENOUS | Status: DC
  Administered 2014-09-09 – 2014-09-11 (×2): via INTRAVENOUS

## 2014-09-09 MED ORDER — TOPIRAMATE 100 MG PO TABS
150.0000 mg | ORAL_TABLET | Freq: Every day | ORAL | Status: DC
  Administered 2014-09-09: 150 mg via ORAL
  Filled 2014-09-09 (×2): qty 2

## 2014-09-09 MED ORDER — IOHEXOL 300 MG/ML  SOLN
100.0000 mL | Freq: Once | INTRAMUSCULAR | Status: AC | PRN
  Administered 2014-09-09: 100 mL via INTRAVENOUS

## 2014-09-09 NOTE — ED Notes (Signed)
Pt logrolled and backboard and clothing removed; pt reports thoracic back, R shoulder, and chest pain.
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
## 2014-09-09 NOTE — ED Notes (Signed)
Pt being cleaned.
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
## 2014-09-09 NOTE — ED Notes (Signed)
Patient transported to CT 
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
## 2014-09-09 NOTE — Progress Notes (Signed)
Orthopedic Tech Progress Note Patient Details:  Lindsey Davenport 25-May-1980 [id number] Called brace order in to vendor. Patient ID: [name], female   DOB: February 10, 1980, 34 y.o.   MRN: [id number]   Darrol Poke 09/09/2014, 1:28 PM
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
## 2014-09-10 ENCOUNTER — Inpatient Hospital Stay (HOSPITAL_COMMUNITY): Payer: BC Managed Care – PPO

## 2014-09-10 LAB — CBC
HCT: 39.5 % (ref 36.0–46.0)
HCT: 40.1 % (ref 36.0–46.0)
HEMATOCRIT: 39 % (ref 36.0–46.0)
Hemoglobin: 13.1 g/dL (ref 12.0–15.0)
Hemoglobin: 13.2 g/dL (ref 12.0–15.0)
Hemoglobin: 13.3 g/dL (ref 12.0–15.0)
MCH: 31.6 pg (ref 26.0–34.0)
MCH: 31.5 pg (ref 26.0–34.0)
MCH: 31.1 pg (ref 26.0–34.0)
MCHC: 33.6 g/dL (ref 30.0–36.0)
MCHC: 33.4 g/dL (ref 30.0–36.0)
MCHC: 33.2 g/dL (ref 30.0–36.0)
MCV: 94 fL (ref 78.0–100.0)
MCV: 94.3 fL (ref 78.0–100.0)
MCV: 93.7 fL (ref 78.0–100.0)
Platelets: 139 10*3/uL — ABNORMAL LOW (ref 150–400)
Platelets: 149 10*3/uL — ABNORMAL LOW (ref 150–400)
Platelets: 154 10*3/uL (ref 150–400)
RBC: 4.15 MIL/uL (ref 3.87–5.11)
RBC: 4.19 MIL/uL (ref 3.87–5.11)
RBC: 4.28 MIL/uL (ref 3.87–5.11)
RDW: 13.7 % (ref 11.5–15.5)
RDW: 13.6 % (ref 11.5–15.5)
RDW: 13.6 % (ref 11.5–15.5)
WBC: 5.6 10*3/uL (ref 4.0–10.5)
WBC: 8.1 10*3/uL (ref 4.0–10.5)
WBC: 9.3 10*3/uL (ref 4.0–10.5)

## 2014-09-10 MED ORDER — TOPIRAMATE 100 MG PO TABS
200.0000 mg | ORAL_TABLET | Freq: Every day | ORAL | Status: DC
  Administered 2014-09-10 – 2014-09-12 (×3): 200 mg via ORAL
  Filled 2014-09-10 (×6): qty 2

## 2014-09-10 MED ORDER — VILAZODONE HCL 40 MG PO TABS
20.0000 mg | ORAL_TABLET | Freq: Every day | ORAL | Status: DC
  Administered 2014-09-11 – 2014-09-13 (×3): 20 mg via ORAL
  Filled 2014-09-10 (×6): qty 0.5

## 2014-09-10 MED ORDER — ARIPIPRAZOLE 10 MG PO TABS
20.0000 mg | ORAL_TABLET | Freq: Every day | ORAL | Status: DC
  Administered 2014-09-10 – 2014-09-12 (×3): 20 mg via ORAL
  Filled 2014-09-10 (×7): qty 2
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
## 2014-09-11 LAB — CBC
HEMATOCRIT: 38.9 % (ref 36.0–46.0)
Hemoglobin: 13.1 g/dL (ref 12.0–15.0)
MCH: 31.7 pg (ref 26.0–34.0)
MCHC: 33.7 g/dL (ref 30.0–36.0)
MCV: 94.2 fL (ref 78.0–100.0)
Platelets: 142 10*3/uL — ABNORMAL LOW (ref 150–400)
RBC: 4.13 MIL/uL (ref 3.87–5.11)
RDW: 13.4 % (ref 11.5–15.5)
WBC: 6.8 10*3/uL (ref 4.0–10.5)

## 2014-09-11 MED ORDER — OXYCODONE-ACETAMINOPHEN 5-325 MG PO TABS
1.0000 | ORAL_TABLET | ORAL | Status: DC | PRN
  Administered 2014-09-11 – 2014-09-12 (×5): 2 via ORAL
  Administered 2014-09-12: 1 via ORAL
  Administered 2014-09-12 – 2014-09-13 (×5): 2 via ORAL
  Filled 2014-09-11 (×11): qty 2

## 2014-09-11 MED ORDER — ARIPIPRAZOLE 10 MG PO TABS
20.0000 mg | ORAL_TABLET | Freq: Every day | ORAL | Status: DC
  Administered 2014-09-11 – 2014-09-13 (×2): 20 mg via ORAL
  Filled 2014-09-11: qty 2

## 2014-09-11 MED ORDER — TRAZODONE HCL 50 MG PO TABS
50.0000 mg | ORAL_TABLET | Freq: Every day | ORAL | Status: DC
  Administered 2014-09-11 – 2014-09-12 (×2): 50 mg via ORAL
  Filled 2014-09-11 (×2): qty 1

## 2014-09-11 MED ORDER — TEMAZEPAM 15 MG PO CAPS
15.0000 mg | ORAL_CAPSULE | Freq: Every day | ORAL | Status: DC
  Administered 2014-09-11 – 2014-09-12 (×2): 15 mg via ORAL
  Filled 2014-09-11 (×3): qty 1
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
## 2014-09-12 DIAGNOSIS — S43101A Unspecified dislocation of right acromioclavicular joint, initial encounter: Secondary | ICD-10-CM | POA: Diagnosis present

## 2014-09-12 DIAGNOSIS — S2242XA Multiple fractures of ribs, left side, initial encounter for closed fracture: Secondary | ICD-10-CM | POA: Diagnosis present

## 2014-09-12 LAB — CBC
HCT: 40.1 % (ref 36.0–46.0)
Hemoglobin: 13.6 g/dL (ref 12.0–15.0)
MCH: 31.3 pg (ref 26.0–34.0)
MCHC: 33.9 g/dL (ref 30.0–36.0)
MCV: 92.2 fL (ref 78.0–100.0)
Platelets: 185 10*3/uL (ref 150–400)
RBC: 4.35 MIL/uL (ref 3.87–5.11)
RDW: 13.1 % (ref 11.5–15.5)
WBC: 9.4 10*3/uL (ref 4.0–10.5)

## 2014-09-12 LAB — CBC WITH DIFFERENTIAL/PLATELET
BASOS ABS: 0 10*3/uL (ref 0.0–0.1)
Basophils Relative: 0 % (ref 0–1)
EOS ABS: 0.2 10*3/uL (ref 0.0–0.7)
EOS PCT: 2 % (ref 0–5)
HCT: 40.6 % (ref 36.0–46.0)
Hemoglobin: 13.5 g/dL (ref 12.0–15.0)
Lymphocytes Relative: 23 % (ref 12–46)
Lymphs Abs: 2.2 10*3/uL (ref 0.7–4.0)
MCH: 31.4 pg (ref 26.0–34.0)
MCHC: 33.3 g/dL (ref 30.0–36.0)
MCV: 94.4 fL (ref 78.0–100.0)
MONO ABS: 0.5 10*3/uL (ref 0.1–1.0)
Monocytes Relative: 6 % (ref 3–12)
Neutro Abs: 6.3 10*3/uL (ref 1.7–7.7)
Neutrophils Relative %: 69 % (ref 43–77)
Platelets: 157 10*3/uL (ref 150–400)
RBC: 4.3 MIL/uL (ref 3.87–5.11)
RDW: 13.3 % (ref 11.5–15.5)
WBC: 9.2 10*3/uL (ref 4.0–10.5)

## 2014-09-12 MED ORDER — SODIUM CHLORIDE 0.9 % IJ SOLN: 3.0000 mL | INTRAMUSCULAR | Status: DC | PRN

## 2014-09-12 MED ORDER — SODIUM CHLORIDE 0.9 % IJ SOLN: 3.0000 mL | Freq: Two times a day (BID) | INTRAMUSCULAR | Status: DC

## 2014-09-12 NOTE — Care Management Important Message (Signed)
Important Message  Patient Details  Name: Lindsey Davenport MRN: [id number] Date of Birth: 02-20-1980   Medicare Important Message Given:  Yes-second notification given    Delorse Lek 09/12/2014, 3:06 PM
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
## 2014-09-13 LAB — CBC
HCT: 40.1 % (ref 36.0–46.0)
Hemoglobin: 13.5 g/dL (ref 12.0–15.0)
MCH: 31.3 pg (ref 26.0–34.0)
MCHC: 33.7 g/dL (ref 30.0–36.0)
MCV: 93 fL (ref 78.0–100.0)
Platelets: 188 10*3/uL (ref 150–400)
RBC: 4.31 MIL/uL (ref 3.87–5.11)
RDW: 13.2 % (ref 11.5–15.5)
WBC: 7.9 10*3/uL (ref 4.0–10.5)

## 2014-09-13 MED ORDER — OXYCODONE-ACETAMINOPHEN 5-325 MG PO TABS: 1.0000 | ORAL_TABLET | Freq: Three times a day (TID) | ORAL | Status: DC | PRN

## 2014-09-13 NOTE — Care Management Note (Signed)
Case Management Note  Patient Details  Name: TAIMANE STIMMEL MRN: [id number] Date of Birth: 03/12/1980  Subjective/Objective:   Pt for dc home today with spouse.  She will need HH follow up, DME.                    Action/Plan: Met with pt to discuss home arrangements.  Referral to [facility], per pt choice.  Start of care 24-48h post dc date.  Referral to [facility] for DME needs; DME to be delivered to pt's room prior to dc.    Expected Discharge Date:     09/13/14             Expected Discharge Plan:  [place]  In-House Referral:     Discharge planning Services  CM Consult  Post Acute Care Choice:    Choice offered to:     DME Arranged:  3-N-1, [place]ford Rile DME Agency:  [place]:  PT [facility] Agency:     Status of Service:  Completed, signed off  Medicare Important Message Given:  Yes-second notification given Date Medicare IM Given:    Medicare IM give by:    Date Additional Medicare IM Given:    Additional Medicare Important Message give by:     If discussed at [place] of Stay Meetings, dates discussed:    Additional Comments:  Reinaldo Raddle, RN, BSN  Trauma/Neuro ICU Case Manager [phone]
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
## 2014-09-13 NOTE — Discharge Instructions (Signed)
Acromioclavicular Separation with Rehab The acromioclavicular joint is the joint between the roof of the shoulder (acromion) and the collarbone (clavicle). It is vulnerable to injury. An acromioclavicular [facility]) separation is a partial or complete tear (sprain), injury, or redness and soreness (inflammation) of the ligaments that cross the acromioclavicular joint and hold it in place. There are two ligaments in this area that are vulnerable to injury, the acromioclavicular ligament and the coracoclavicular ligament. SYMPTOMS   Tenderness and swelling, or a bump on top of the shoulder (at the [facility] joint).  Bruising (contusion) in the area within 48 hours of injury.  Loss of strength or pain when reaching over the head or across the body. CAUSES  AC separation is caused by direct trauma to the joint (falling on your shoulder) or indirect trauma (falling on an outstretched arm). RISK INCREASES WITH:  Sports that require contact or collision, throwing sports (i.e. racquetball, squash).  Poor strength and flexibility.  Previous shoulder sprain or dislocation.  Poorly fitted or padded protective equipment. PREVENTION   Warm-up and stretch properly before activity.  Maintain physical fitness:  Shoulder strength.  Shoulder flexibility.  Cardiovascular fitness.  Wear properly fitted and padded protective equipment.  Learn and use proper technique when playing sports. Have a coach correct improper technique, including falling and landing.  Apply taping, protective strapping or padding, or an adhesive bandage as recommended before practice or competition. PROGNOSIS   If treated properly, the symptoms of AC separation can be expected to go away.  If treated improperly, permanent disability may occur unless surgery is performed.  Healing time varies with type of sport and position, arm injured (dominant versus non-dominant) and severity of sprain. RELATED COMPLICATIONS  Weakness and  fatigue of the arm or shoulder are possible but uncommon.  Pain and inflammation of the [facility] joint may continue.  Prolonged healing time may be necessary if usual activities are resumed too early. This causes a susceptibility to recurrent injury.  Prolonged disability may occur.  The shoulder may remain unstable or arthritic following repeated injury. TREATMENT  Treatment initially involves ice and medication to help reduce pain and inflammation. It may also be necessary to modify your activities in order to prevent further injury. Both non-surgical and surgical interventions exist to treat AC separation. Non-surgical intervention is usually recommended and involves wearing a sling to immobilize the joint for a period of time to allow for healing. Surgical intervention is usually only considered for severe sprains of the ligament or for individuals who do not improve after 2 to 6 months of non-surgical treatment. Surgical interventions require 4 to 6 months before a return to sports is possible. MEDICATION  If pain medication is necessary, nonsteroidal anti-inflammatory medications, such as aspirin and ibuprofen, or other minor pain relievers, such as acetaminophen, are often recommended.  Do not take pain medication for 7 days before surgery.  Prescription pain relievers may be given by your caregiver. Use only as directed and only as much as you need.  Ointments applied to the skin may be helpful.  Corticosteroid injections may be given to reduce inflammation. HEAT AND COLD  Cold treatment (icing) relieves pain and reduces inflammation. Cold treatment should be applied for 10 to 15 minutes every 2 to 3 hours for inflammation and pain and immediately after any activity that aggravates your symptoms. Use ice packs or an ice massage.  Heat treatment may be used prior to performing the stretching and strengthening activities prescribed by your caregiver, physical therapist or  [employer]. Use a heat pack or a warm soak. SEEK IMMEDIATE MEDICAL CARE IF:   Pain, swelling or bruising worsens despite treatment.  There is pain, numbness or coldness in the arm.  Discoloration appears in the fingernails.  New, unexplained symptoms develop. EXERCISES  RANGE OF MOTION (ROM) AND STRETCHING EXERCISES - Acromioclavicular Separation These exercises may help you when beginning to rehabilitate your injury. Your symptoms may resolve with or without further involvement from your physician, physical therapist or athletic trainer. While completing these exercises, remember:  Restoring tissue flexibility helps normal motion to return to the joints. This allows healthier, less painful movement and activity.  An effective stretch should be held for at least 30 seconds.  A stretch should never be painful. You should only feel a gentle lengthening or release in the stretched tissue. ROM - Pendulum  Bend at the waist so that your right / left arm falls away from your body. Support yourself with your opposite hand on a solid surface, such as a table or a countertop.  Your right / left arm should be perpendicular to the ground. If it is not perpendicular, you need to lean over farther. Relax the muscles in your right / left arm and shoulder as much as possible.  Gently sway your hips and trunk so they move your right / left arm without any use of your right / left shoulder muscles.  Progress your movements so that your right / left arm moves side to side, then forward and backward, and finally, both clockwise and counterclockwise.  Complete __________ repetitions in each direction. Many people use this exercise to relieve discomfort in their shoulder as well as to gain range of motion. Repeat __________ times. Complete this exercise __________ times per day. STRETCH - Flexion, Seated   Sit in a firm chair so that your right / left forearm can rest on a table or countertop. Your right /  left elbow should rest below the height of your shoulder so that your shoulder feels supported and not tense or uncomfortable.  Keeping your right / left shoulder relaxed, lean forward at your waist, allowing your right / left hand to slide forward. Bend forward until you feel a moderate stretch in your shoulder, but before you feel an increase in your pain.  Hold __________ seconds. Slowly return to your starting position. Repeat __________ times. Complete this exercise __________ times per day. STRETCH - Flexion, Standing  Stand with good posture. With an underhand grip on your right / left and an overhand grip on the opposite hand, grasp a broomstick or cane so that your hands are a little more than shoulder-width apart.  Keeping your right / left elbow straight and shoulder muscles relaxed, push the stick with your opposite hand to raise your right / left arm in front of your body and then overhead. Raise your arm until you feel a stretch in your right / left shoulder, but before you have increased shoulder pain.  Try to avoid shrugging your right / left shoulder as your arm rises by keeping your shoulder blade tucked down and toward your mid-back spine. Hold __________ seconds.  Slowly return to the starting position. Repeat __________ times. Complete this exercise __________ times per day. STRENGTHENING EXERCISES - Acromioclavicular Separation These exercises may help you when beginning to rehabilitate your injury. They may resolve your symptoms with or without further involvement from your physician, physical therapist or athletic trainer. While completing these exercises, remember:  Muscles  can gain both the endurance and the strength needed for everyday activities through controlled exercises.  Complete these exercises as instructed by your physician, physical therapist or athletic trainer. Progress the resistance and repetitions only as guided.  You may experience muscle soreness or  fatigue, but the pain or discomfort you are trying to eliminate should never worsen during these exercises. If this pain does worsen, stop and make certain you are following the directions exactly. If the pain is still present after adjustments, discontinue the exercise until you can discuss the trouble with your clinician. STRENGTH - Shoulder Abductors, Isometric   With good posture, stand or sit about 4-6 inches from a wall with your right / left side facing the wall.  Bend your right / left elbow. Gently press your right / left elbow into the wall. Increase the pressure gradually until you are pressing as hard as you can without shrugging your shoulder or increasing any shoulder discomfort.  Hold __________ seconds.  Release the tension slowly. Relax your shoulder muscles completely before you start the next repetition. Repeat __________ times. Complete this exercise __________ times per day. STRENGTH - Internal Rotators, Isometric  Keep your right / left elbow at your side and bend it 90 degrees.  Step into a door frame so that the inside of your right / left wrist can press against the door frame without your upper arm leaving your side.  Gently press your right / left wrist into the door frame as if you were trying to draw the palm of your hand to your abdomen. Gradually increase the tension until you are pressing as hard as you can without shrugging your shoulder or increasing any shoulder discomfort.  Hold __________ seconds.  Release the tension slowly. Relax your shoulder muscles completely before you the next repetition. Repeat __________ times. Complete this exercise __________ times per day.  STRENGTH - External Rotators, Isometric  Keep your right / left elbow at your side and bend it 90 degrees.  Step into a door frame so that the outside of your right / left wrist can press against the door frame without your upper arm leaving your side.  Gently press your right / left  wrist into the door frame as if you were trying to swing the back of your hand away from your abdomen. Gradually increase the tension until you are pressing as hard as you can without shrugging your shoulder or increasing any shoulder discomfort.  Hold __________ seconds.  Release the tension slowly. Relax your shoulder muscles completely before you the next repetition. Repeat __________ times. Complete this exercise __________ times per day. STRENGTH - Internal Rotators  Secure a rubber exercise band/tubing to a fixed object so that it is at the same height as your right / left elbow when you are standing or sitting on a firm surface.  Stand or sit so that the secured exercise band/tubing is at your right / left side.  Bend your elbow 90 degrees. Place a folded towel or small pillow under your right / left arm so that your elbow is a few inches away from your side.  Keeping the tension on the exercise band/tubing, pull it across your body toward your abdomen. Be sure to keep your body steady so that the movement is only coming from your shoulder rotating.  Hold __________ seconds. Release the tension in a controlled manner as you return to the starting position. Repeat __________ times. Complete this exercise __________ times per day. STRENGTH -  External Rotators  Secure a rubber exercise band/tubing to a fixed object so that it is at the same height as your right / left elbow when you are standing or sitting on a firm surface.  Stand or sit so that the secured exercise band/tubing is at your side that is not injured.  Bend your elbow 90 degrees. Place a folded towel or small pillow under your right / left arm so that your elbow is a few inches away from your side.  Keeping the tension on the exercise band/tubing, pull it away from your body, as if pivoting on your elbow. Be sure to keep your body steady so that the movement is only coming from your shoulder rotating.  Hold __________  seconds. Release the tension in a controlled manner as you return to the starting position. Repeat __________ times. Complete this exercise __________ times per day. Document Released: 01/18/2005 Document Revised: 04/12/2011 Document Reviewed: 05/02/2008 [facility] Patient Information 2015 [place], [place]. This information is not intended to replace advice given to you by your health care provider. Make sure you discuss any questions you have with your health care provider. Liver Laceration A liver laceration is a tear or cut in the liver. The liver is the largest solid organ in the body and is involved in many important bodily functions. Sometimes, a liver laceration can be a very serious injury. It can cause a lot of bleeding. Surgery may be needed. Other times, a liver laceration may be minor. Bed rest may be all that is needed. Either way, treatment in a hospital is almost always required.  Liver lacerations are categorized in grades from 1 to 5. Low numbers identify lacerations that are less severe than those with high numbers.   Grade 1: This is a tear in the outer lining of the liver. It is less than  inch (1 cm) deep.   Grade 2: This is a tear that is about  inch to 1 inch (1 to 3 cm) deep. It is less than 4 inches (10 cm) long.   Grade 3: This is a tear that is slightly more than 1 inch (3 cm) deep.   Grades 4 and 5: These lacerations are very deep. They affect a large part of the liver.  CAUSES  A strong blow to the area around your liver (blunt trauma). Blunt trauma can tear the liver even though it does not break the skin.  An injury in which an object goes through the skin into the liver (penetrating injury). SIGNS AND SYMPTOMS The most common symptoms of liver laceration are:   A swollen and firm abdomen.   Pain in the abdomen.   Tenderness when pressing on the right side of the abdomen.  Other symptoms may include:   Bleeding from a penetrating wound.   Spitting  up blood.   Bruises on the abdomen.   A fast heartbeat.   Taking quick breaths.   Feeling weak and dizzy.  DIAGNOSIS To determine if you have a liver laceration, your health care provider will perform a physical exam and ask about any injuries to the right side of the abdomen. Various tests may be ordered, such as:  Blood tests. Your blood may be tested every few hours. This will show if you are losing blood.   CT scan. This test is used to check for laceration or bleeding.  Laparoscopy. This involves placing a small camera into the abdomen and looking directly at the surface of the liver.  TREATMENT Treatment for a liver laceration will vary depending on how deep the cut is and on the amount of bleeding. Treatment options include:  Bed rest. The person is watched closely. Tests are done very often.   Blood transfusions. Blood is given from someone else. This replaces blood lost from the injury. A transfusion may need to be done several times.   Surgery. A procedure may be needed to open the abdomen. Then, special material might be packed around the laceration to help it heal, or the laceration might be repaired. HOME CARE INSTRUCTIONS  Only take over-the-counter or prescription medicines as directed by your health care provider. Take all prescribed medicines exactly as directed. Do not take any other medicines without first asking your health care provider.  Rest and limit your activity as directed by your health care provider. It may be several months before you can go back to your old routine. Do not participate in activities that involve physical contact or require extra energy until your health care provider approves.  Keep all follow-up appointments with your health care provider. You may need more blood tests or another CT scan to make sure your injury is healing. SEEK MEDICAL CARE IF:  Your abdominal pain does not go away.   You feel more weak and tired than  usual.  SEEK IMMEDIATE MEDICAL CARE IF:  Your abdominal pain gets worse.   You have a cut or incision on your skin that becomes red, swells, or leaks any fluid.   You feel dizzy or very weak.   You have trouble breathing.   You have a fever.  MAKE SURE YOU:  Understand these instructions.  Will watch your condition.  Will get help right away if you are not doing well or get worse. Document Released: 02/20/2010 Document Revised: 09/20/2012 Document Reviewed: 07/10/2012 [facility] Patient Information 2015 [place], [place]. This information is not intended to replace advice given to you by your health care provider. Make sure you discuss any questions you have with your health care provider.

## 2014-09-23 ENCOUNTER — Telehealth (HOSPITAL_COMMUNITY): Payer: Self-pay

## 2014-09-23 NOTE — Telephone Encounter (Signed)
Pt still having pain, mostly in back but some in shoulder and side. Recommended he get refill from [facility] as that's her main complaint. Gave Dr. Harley Hallmark office number.

## 2014-09-24 ENCOUNTER — Other Ambulatory Visit (HOSPITAL_COMMUNITY): Payer: Self-pay | Admitting: Psychiatry

## 2014-11-06 ENCOUNTER — Ambulatory Visit
Admission: RE | Admit: 2014-11-06 | Discharge: 2014-11-06 | Disposition: A | Discharge status: Home / Self Care | Payer: BC Managed Care – PPO | Source: Ambulatory Visit | Attending: Neurosurgery | Admitting: Neurosurgery

## 2014-11-06 ENCOUNTER — Other Ambulatory Visit: Payer: Self-pay | Admitting: Neurosurgery

## 2014-11-06 DIAGNOSIS — M5489 Other dorsalgia: Secondary | ICD-10-CM

## 2014-12-17 ENCOUNTER — Emergency Department (HOSPITAL_COMMUNITY): Payer: BC Managed Care – PPO

## 2014-12-17 ENCOUNTER — Encounter (HOSPITAL_COMMUNITY): Payer: Self-pay | Admitting: Emergency Medicine

## 2014-12-17 ENCOUNTER — Emergency Department (HOSPITAL_COMMUNITY)
Admission: EM | Admit: 2014-12-17 | Discharge: 2014-12-17 | Disposition: A | Discharge status: Home / Self Care | Payer: BC Managed Care – PPO | Attending: Emergency Medicine | Admitting: Emergency Medicine

## 2014-12-17 DIAGNOSIS — Z79890 Hormone replacement therapy (postmenopausal): Secondary | ICD-10-CM | POA: Insufficient documentation

## 2014-12-17 DIAGNOSIS — Z8619 Personal history of other infectious and parasitic diseases: Secondary | ICD-10-CM | POA: Insufficient documentation

## 2014-12-17 DIAGNOSIS — F4310 Post-traumatic stress disorder, unspecified: Secondary | ICD-10-CM | POA: Insufficient documentation

## 2014-12-17 DIAGNOSIS — G43909 Migraine, unspecified, not intractable, without status migrainosus: Secondary | ICD-10-CM | POA: Insufficient documentation

## 2014-12-17 DIAGNOSIS — F17210 Nicotine dependence, cigarettes, uncomplicated: Secondary | ICD-10-CM | POA: Insufficient documentation

## 2014-12-17 DIAGNOSIS — Z8719 Personal history of other diseases of the digestive system: Secondary | ICD-10-CM | POA: Insufficient documentation

## 2014-12-17 DIAGNOSIS — G40909 Epilepsy, unspecified, not intractable, without status epilepticus: Secondary | ICD-10-CM | POA: Diagnosis not present

## 2014-12-17 DIAGNOSIS — R112 Nausea with vomiting, unspecified: Secondary | ICD-10-CM | POA: Insufficient documentation

## 2014-12-17 DIAGNOSIS — Y9389 Activity, other specified: Secondary | ICD-10-CM | POA: Insufficient documentation

## 2014-12-17 DIAGNOSIS — Z87440 Personal history of urinary (tract) infections: Secondary | ICD-10-CM | POA: Insufficient documentation

## 2014-12-17 DIAGNOSIS — Y92009 Unspecified place in unspecified non-institutional (private) residence as the place of occurrence of the external cause: Secondary | ICD-10-CM | POA: Diagnosis not present

## 2014-12-17 DIAGNOSIS — Z79899 Other long term (current) drug therapy: Secondary | ICD-10-CM | POA: Insufficient documentation

## 2014-12-17 DIAGNOSIS — S199XXA Unspecified injury of neck, initial encounter: Secondary | ICD-10-CM | POA: Diagnosis not present

## 2014-12-17 DIAGNOSIS — E039 Hypothyroidism, unspecified: Secondary | ICD-10-CM | POA: Diagnosis not present

## 2014-12-17 DIAGNOSIS — S40011A Contusion of right shoulder, initial encounter: Secondary | ICD-10-CM

## 2014-12-17 DIAGNOSIS — S4991XA Unspecified injury of right shoulder and upper arm, initial encounter: Secondary | ICD-10-CM | POA: Diagnosis present

## 2014-12-17 DIAGNOSIS — W01198A Fall on same level from slipping, tripping and stumbling with subsequent striking against other object, initial encounter: Secondary | ICD-10-CM | POA: Insufficient documentation

## 2014-12-17 DIAGNOSIS — F319 Bipolar disorder, unspecified: Secondary | ICD-10-CM | POA: Diagnosis not present

## 2014-12-17 DIAGNOSIS — Y998 Other external cause status: Secondary | ICD-10-CM | POA: Diagnosis not present

## 2014-12-17 DIAGNOSIS — W19XXXA Unspecified fall, initial encounter: Secondary | ICD-10-CM

## 2014-12-17 MED ORDER — OXYCODONE-ACETAMINOPHEN 5-325 MG PO TABS: 1.0000 | ORAL_TABLET | Freq: Three times a day (TID) | ORAL | Status: DC | PRN

## 2014-12-17 MED ORDER — OXYCODONE-ACETAMINOPHEN 5-325 MG PO TABS
1.0000 | ORAL_TABLET | Freq: Once | ORAL | Status: AC
  Administered 2014-12-17: 1 via ORAL
  Filled 2014-12-17: qty 1
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
## 2014-12-17 NOTE — ED Notes (Signed)
Dr. Glick at bedside.  

## 2014-12-17 NOTE — ED Notes (Signed)
Pt. tripped and fell on couch at home this evening , no LOC , reports pain at right arm , pt. stated right clavicle surgery 2 weeks ago .

## 2014-12-17 NOTE — Discharge Instructions (Signed)
Keep your scheduled appointment with the orthopedic surgeon.  Fall Prevention in the Home  Falls can cause injuries and can affect people from all age groups. There are many simple things that you can do to make your home safe and to help prevent falls. WHAT CAN I DO ON THE OUTSIDE OF MY HOME?  Regularly repair the edges of walkways and driveways and fix any cracks.  Remove high doorway thresholds.  Trim any shrubbery on the main path into your home.  Use bright outdoor lighting.  Clear walkways of debris and clutter, including tools and rocks.  Regularly check that handrails are securely fastened and in good repair. Both sides of any steps should have handrails.  Install guardrails along the edges of any raised decks or porches.  Have leaves, snow, and ice cleared regularly.  Use sand or salt on walkways during winter months.  In the garage, clean up any spills right away, including grease or oil spills. WHAT CAN I DO IN THE BATHROOM?  Use night lights.  Install grab bars by the toilet and in the tub and shower. Do not use towel bars as grab bars.  Use non-skid mats or decals on the floor of the tub or shower.  If you need to sit down while you are in the shower, use a plastic, non-slip stool.Marland Kitchen  Keep the floor dry. Immediately clean up any water that spills on the floor.  Remove soap buildup in the tub or shower on a regular basis.  Attach bath mats securely with double-sided non-slip rug tape.  Remove throw rugs and other tripping hazards from the floor. WHAT CAN I DO IN THE BEDROOM?  Use night lights.  Make sure that a bedside light is easy to reach.  Do not use oversized bedding that drapes onto the floor.  Have a firm chair that has side arms to use for getting dressed.  Remove throw rugs and other tripping hazards from the floor. WHAT CAN I DO IN THE KITCHEN?   Clean up any spills right away.  Avoid walking on wet floors.  Place frequently used items  in easy-to-reach places.  If you need to reach for something above you, use a sturdy step stool that has a grab bar.  Keep electrical cables out of the way.  Do not use floor polish or wax that makes floors slippery. If you have to use wax, make sure that it is non-skid floor wax.  Remove throw rugs and other tripping hazards from the floor. WHAT CAN I DO IN THE STAIRWAYS?  Do not leave any items on the stairs.  Make sure that there are handrails on both sides of the stairs. Fix handrails that are broken or loose. Make sure that handrails are as long as the stairways.  Check any carpeting to make sure that it is firmly attached to the stairs. Fix any carpet that is loose or worn.  Avoid having throw rugs at the top or bottom of stairways, or secure the rugs with carpet tape to prevent them from moving.  Make sure that you have a light switch at the top of the stairs and the bottom of the stairs. If you do not have them, have them installed. WHAT ARE SOME OTHER FALL PREVENTION TIPS?  Wear closed-toe shoes that fit well and support your feet. Wear shoes that have rubber soles or low heels.  When you use a stepladder, make sure that it is completely opened and that the sides are  firmly locked. Have someone hold the ladder while you are using it. Do not climb a closed stepladder.  Add color or contrast paint or tape to grab bars and handrails in your home. Place contrasting color strips on the first and last steps.  Use mobility aids as needed, such as canes, walkers, scooters, and crutches.  Turn on lights if it is dark. Replace any light bulbs that burn out.  Set up furniture so that there are clear paths. Keep the furniture in the same spot.  Fix any uneven floor surfaces.  Choose a carpet design that does not hide the edge of steps of a stairway.  Be aware of any and all pets.  Review your medicines with your healthcare provider. Some medicines can cause dizziness or changes  in blood pressure, which increase your risk of falling. Talk with your health care provider about other ways that you can decrease your risk of falls. This may include working with a physical therapist or trainer to improve your strength, balance, and endurance.   This information is not intended to replace advice given to you by your health care provider. Make sure you discuss any questions you have with your health care provider.   Document Released: 01/08/2002 Document Revised: 06/04/2014 Document Reviewed: 02/22/2014 Elsevier Interactive Patient Education 2016 [place] A contusion is a deep bruise. Contusions are the result of a blunt injury to tissues and muscle fibers under the skin. The injury causes bleeding under the skin. The skin overlying the contusion may turn blue, purple, or yellow. Minor injuries will give you a painless contusion, but more severe contusions may stay painful and swollen for a few weeks.  CAUSES  This condition is usually caused by a blow, trauma, or direct force to an area of the body. SYMPTOMS  Symptoms of this condition include:  Swelling of the injured area.  Pain and tenderness in the injured area.  Discoloration. The area may have redness and then turn blue, purple, or yellow. DIAGNOSIS  This condition is diagnosed based on a physical exam and medical history. An X-ray, CT scan, or MRI may be needed to determine if there are any associated injuries, such as broken bones (fractures). TREATMENT  Specific treatment for this condition depends on what area of the body was injured. In general, the best treatment for a contusion is resting, icing, applying pressure to (compression), and elevating the injured area. This is often called the RICE strategy. Over-the-counter anti-inflammatory medicines may also be recommended for pain control.  HOME CARE INSTRUCTIONS   Rest the injured area.  If directed, apply ice to the injured area:  Put  ice in a plastic bag.  Place a towel between your skin and the bag.  Leave the ice on for 20 minutes, 2-3 times per day.  If directed, apply light compression to the injured area using an elastic bandage. Make sure the bandage is not wrapped too tightly. Remove and reapply the bandage as directed by your health care provider.  If possible, raise (elevate) the injured area above the level of your heart while you are sitting or lying down.  Take over-the-counter and prescription medicines only as told by your health care provider. SEEK MEDICAL CARE IF:  Your symptoms do not improve after several days of treatment.  Your symptoms get worse.  You have difficulty moving the injured area. SEEK IMMEDIATE MEDICAL CARE IF:   You have severe pain.  You have numbness in a  hand or foot.  Your hand or foot turns pale or cold.   This information is not intended to replace advice given to you by your health care provider. Make sure you discuss any questions you have with your health care provider.   Document Released: 10/28/2004 Document Revised: 10/09/2014 Document Reviewed: 06/05/2014 Elsevier Interactive Patient Education 2016 [employer]  Acetaminophen; Oxycodone tablets What is this medicine? ACETAMINOPHEN; OXYCODONE (a set a MEE noe fen; ox i KOE done) is a pain reliever. It is used to treat moderate to severe pain. This medicine may be used for other purposes; ask your health care provider or pharmacist if you have questions. What should I tell my health care provider before I take this medicine? They need to know if you have any of these conditions: -brain tumor -Crohn's disease, inflammatory bowel disease, or ulcerative colitis -drug abuse or addiction -head injury -heart or circulation problems -if you often drink alcohol -kidney disease or problems going to the bathroom -liver disease -lung disease, asthma, or breathing problems -an unusual or allergic reaction to  acetaminophen, oxycodone, other opioid analgesics, other medicines, foods, dyes, or preservatives -pregnant or trying to get pregnant -breast-feeding How should I use this medicine? Take this medicine by mouth with a full glass of water. Follow the directions on the prescription label. You can take it with or without food. If it upsets your stomach, take it with food. Take your medicine at regular intervals. Do not take it more often than directed. Talk to your pediatrician regarding the use of this medicine in children. Special care may be needed. Patients over 86 years old may have a stronger reaction and need a smaller dose. Overdosage: If you think you have taken too much of this medicine contact a poison control center or emergency room at once. NOTE: This medicine is only for you. Do not share this medicine with others. What if I miss a dose? If you miss a dose, take it as soon as you can. If it is almost time for your next dose, take only that dose. Do not take double or extra doses. What may interact with this medicine? -alcohol -antihistamines -barbiturates like amobarbital, butalbital, butabarbital, methohexital, pentobarbital, phenobarbital, thiopental, and secobarbital -benztropine -drugs for bladder problems like solifenacin, trospium, oxybutynin, tolterodine, hyoscyamine, and methscopolamine -drugs for breathing problems like ipratropium and tiotropium -drugs for certain stomach or intestine problems like propantheline, homatropine methylbromide, glycopyrrolate, atropine, belladonna, and dicyclomine -general anesthetics like etomidate, ketamine, nitrous oxide, propofol, desflurane, enflurane, halothane, isoflurane, and sevoflurane -medicines for depression, anxiety, or psychotic disturbances -medicines for sleep -muscle relaxants -naltrexone -narcotic medicines (opiates) for pain -phenothiazines like perphenazine, thioridazine, chlorpromazine, mesoridazine, fluphenazine,  prochlorperazine, promazine, and trifluoperazine -scopolamine -tramadol -trihexyphenidyl This list may not describe all possible interactions. Give your health care provider a list of all the medicines, herbs, non-prescription drugs, or dietary supplements you use. Also tell them if you smoke, drink alcohol, or use illegal drugs. Some items may interact with your medicine. What should I watch for while using this medicine? Tell your doctor or health care professional if your pain does not go away, if it gets worse, or if you have new or a different type of pain. You may develop tolerance to the medicine. Tolerance means that you will need a higher dose of the medication for pain relief. Tolerance is normal and is expected if you take this medicine for a long time. Do not suddenly stop taking your medicine because you may develop a  severe reaction. Your body becomes used to the medicine. This does NOT mean you are addicted. Addiction is a behavior related to getting and using a drug for a non-medical reason. If you have pain, you have a medical reason to take pain medicine. Your doctor will tell you how much medicine to take. If your doctor wants you to stop the medicine, the dose will be slowly lowered over time to avoid any side effects. You may get drowsy or dizzy. Do not drive, use machinery, or do anything that needs mental alertness until you know how this medicine affects you. Do not stand or sit up quickly, especially if you are an older patient. This reduces the risk of dizzy or fainting spells. Alcohol may interfere with the effect of this medicine. Avoid alcoholic drinks. There are different types of narcotic medicines (opiates) for pain. If you take more than one type at the same time, you may have more side effects. Give your health care provider a list of all medicines you use. Your doctor will tell you how much medicine to take. Do not take more medicine than directed. Call emergency for help  if you have problems breathing. The medicine will cause constipation. Try to have a bowel movement at least every 2 to 3 days. If you do not have a bowel movement for 3 days, call your doctor or health care professional. Do not take Tylenol (acetaminophen) or medicines that have acetaminophen with this medicine. Too much acetaminophen can be very dangerous. Many nonprescription medicines contain acetaminophen. Always read the labels carefully to avoid taking more acetaminophen. What side effects may I notice from receiving this medicine? Side effects that you should report to your doctor or health care professional as soon as possible: -allergic reactions like skin rash, itching or hives, swelling of the face, lips, or tongue -breathing difficulties, wheezing -confusion -light headedness or fainting spells -severe stomach pain -unusually weak or tired -yellowing of the skin or the whites of the eyes Side effects that usually do not require medical attention (report to your doctor or health care professional if they continue or are bothersome): -dizziness -drowsiness -nausea -vomiting This list may not describe all possible side effects. Call your doctor for medical advice about side effects. You may report side effects to FDA at 1-800-FDA-1088. Where should I keep my medicine? Keep out of the reach of children. This medicine can be abused. Keep your medicine in a safe place to protect it from theft. Do not share this medicine with anyone. Selling or giving away this medicine is dangerous and against the law. This medicine may cause accidental overdose and death if it taken by other adults, children, or pets. Mix any unused medicine with a substance like cat litter or coffee grounds. Then throw the medicine away in a sealed container like a sealed bag or a coffee can with a lid. Do not use the medicine after the expiration date. Store at room temperature between 20 and 25 degrees C (68 and 77  degrees F). NOTE: This sheet is a summary. It may not cover all possible information. If you have questions about this medicine, talk to your doctor, pharmacist, or health care provider.    2016, Elsevier/[employer] Standard. (2013-12-19 15:18:46)

## 2014-12-17 NOTE — ED Notes (Signed)
Patient verbalized understanding of discharge instructions and prescription medication and denies any further needs or questions at this time.

## 2015-01-29 ENCOUNTER — Emergency Department: Payer: BC Managed Care – PPO

## 2015-01-29 ENCOUNTER — Emergency Department
Admission: EM | Admit: 2015-01-29 | Discharge: 2015-01-29 | Disposition: A | Discharge status: Home / Self Care | Payer: BC Managed Care – PPO | Attending: Emergency Medicine | Admitting: Emergency Medicine

## 2015-01-29 ENCOUNTER — Encounter: Payer: Self-pay | Admitting: Emergency Medicine

## 2015-01-29 DIAGNOSIS — Z79899 Other long term (current) drug therapy: Secondary | ICD-10-CM | POA: Diagnosis not present

## 2015-01-29 DIAGNOSIS — Z3202 Encounter for pregnancy test, result negative: Secondary | ICD-10-CM | POA: Diagnosis not present

## 2015-01-29 DIAGNOSIS — S50811A Abrasion of right forearm, initial encounter: Secondary | ICD-10-CM | POA: Diagnosis not present

## 2015-01-29 DIAGNOSIS — Y92410 Unspecified street and highway as the place of occurrence of the external cause: Secondary | ICD-10-CM | POA: Diagnosis not present

## 2015-01-29 DIAGNOSIS — S0990XA Unspecified injury of head, initial encounter: Secondary | ICD-10-CM | POA: Diagnosis not present

## 2015-01-29 DIAGNOSIS — R4182 Altered mental status, unspecified: Secondary | ICD-10-CM

## 2015-01-29 DIAGNOSIS — F1410 Cocaine abuse, uncomplicated: Secondary | ICD-10-CM | POA: Diagnosis not present

## 2015-01-29 DIAGNOSIS — Y998 Other external cause status: Secondary | ICD-10-CM | POA: Diagnosis not present

## 2015-01-29 DIAGNOSIS — X58XXXA Exposure to other specified factors, initial encounter: Secondary | ICD-10-CM | POA: Insufficient documentation

## 2015-01-29 DIAGNOSIS — S20212A Contusion of left front wall of thorax, initial encounter: Secondary | ICD-10-CM | POA: Insufficient documentation

## 2015-01-29 DIAGNOSIS — Y9389 Activity, other specified: Secondary | ICD-10-CM | POA: Insufficient documentation

## 2015-01-29 DIAGNOSIS — F17210 Nicotine dependence, cigarettes, uncomplicated: Secondary | ICD-10-CM | POA: Insufficient documentation

## 2015-01-29 LAB — URINE DRUG SCREEN, QUALITATIVE (ARMC ONLY)
Amphetamines, Ur Screen: NOT DETECTED
Barbiturates, Ur Screen: NOT DETECTED
Benzodiazepine, Ur Scrn: NOT DETECTED
CANNABINOID 50 NG, UR ~~LOC~~: NOT DETECTED
COCAINE METABOLITE, UR ~~LOC~~: POSITIVE — AB
MDMA (Ecstasy)Ur Screen: NOT DETECTED
Methadone Scn, Ur: NOT DETECTED
Opiate, Ur Screen: NOT DETECTED
PHENCYCLIDINE (PCP) UR S: NOT DETECTED
Tricyclic, Ur Screen: NOT DETECTED

## 2015-01-29 LAB — POCT PREGNANCY, URINE: Preg Test, Ur: NEGATIVE

## 2015-01-29 LAB — COMPREHENSIVE METABOLIC PANEL
ALBUMIN: 4.2 g/dL (ref 3.5–5.0)
ALT: 19 U/L (ref 14–54)
AST: 24 U/L (ref 15–41)
Alkaline Phosphatase: 79 U/L (ref 38–126)
Anion gap: 9 (ref 5–15)
BILIRUBIN TOTAL: 0.7 mg/dL (ref 0.3–1.2)
BUN: 12 mg/dL (ref 6–20)
CO2: 24 mmol/L (ref 22–32)
Calcium: 9.1 mg/dL (ref 8.9–10.3)
Chloride: 109 mmol/L (ref 101–111)
Creatinine, Ser: 0.71 mg/dL (ref 0.44–1.00)
GFR calc Af Amer: >60 mL/min (ref >60)
GFR calc non Af Amer: >60 mL/min (ref >60)
GLUCOSE: 122 mg/dL — AB (ref 65–99)
POTASSIUM: 4.2 mmol/L (ref 3.5–5.1)
SODIUM: 142 mmol/L (ref 135–145)
TOTAL PROTEIN: 7.1 g/dL (ref 6.5–8.1)

## 2015-01-29 LAB — CBC
HEMATOCRIT: 41.6 % (ref 35.0–47.0)
HEMOGLOBIN: 14 g/dL (ref 12.0–16.0)
MCH: 31.3 pg (ref 26.0–34.0)
MCHC: 33.6 g/dL (ref 32.0–36.0)
MCV: 93.1 fL (ref 80.0–100.0)
Platelets: 189 10*3/uL (ref 150–440)
RBC: 4.46 MIL/uL (ref 3.80–5.20)
RDW: 12.9 % (ref 11.5–14.5)
WBC: 11.3 10*3/uL — AB (ref 3.6–11.0)

## 2015-01-29 LAB — ETHANOL: Alcohol, Ethyl (B): 8 mg/dL — ABNORMAL HIGH (ref <5)

## 2015-01-29 MED ORDER — SODIUM CHLORIDE 0.9 % IV BOLUS (SEPSIS)
1000.0000 mL | Freq: Once | INTRAVENOUS | Status: AC
  Administered 2015-01-29: 1000 mL via INTRAVENOUS

## 2015-01-29 MED ORDER — IOHEXOL 300 MG/ML  SOLN
100.0000 mL | Freq: Once | INTRAMUSCULAR | Status: AC | PRN
  Administered 2015-01-29: 100 mL via INTRAVENOUS

## 2015-01-29 NOTE — ED Notes (Signed)
[place] [employer] here to collect blood from pt. This RN collected blood and handed off to officer Jenne Campus

## 2015-01-29 NOTE — ED Notes (Signed)
Pt ambulated to commode in the room without assistance.   Urine collected.

## 2015-01-29 NOTE — Discharge Instructions (Signed)
Please seek medical attention for any high fevers, chest pain, shortness of breath, change in behavior, persistent vomiting, bloody stool or any other new or concerning symptoms.   Polysubstance Abuse When people abuse more than one drug or type of drug it is called polysubstance or polydrug abuse. For example, many smokers also drink alcohol. This is one form of polydrug abuse. Polydrug abuse also refers to the use of a drug to counteract an unpleasant effect produced by another drug. It may also be used to help with withdrawal from another drug. People who take stimulants may become agitated. Sometimes this agitation is countered with a tranquilizer. This helps protect against the unpleasant side effects. Polydrug abuse also refers to the use of different drugs at the same time.  Anytime drug use is interfering with normal living activities, it has become abuse. This includes problems with family and friends. Psychological dependence has developed when your mind tells you that the drug is needed. This is usually followed by physical dependence which has developed when continuing increases of drug are required to get the same feeling or "high". This is known as addiction or chemical dependency. A person's risk is much higher if there is a history of chemical dependency in the family. SIGNS OF CHEMICAL DEPENDENCY  You have been told by friends or family that drugs have become a problem.  You fight when using drugs.  You are having blackouts (not remembering what you do while using).  You feel sick from using drugs but continue using.  You lie about use or amounts of drugs (chemicals) used.  You need chemicals to get you going.  You are suffering in work performance or in school because of drug use.  You get sick from use of drugs but continue to use anyway.  You need drugs to relate to people or feel comfortable in social situations.  You use drugs to forget problems. "Yes" answered to any  of the above signs of chemical dependency indicates there are problems. The longer the use of drugs continues, the greater the problems will become. If there is a family history of drug or alcohol use, it is best not to experiment with these drugs. Continual use leads to tolerance. After tolerance develops more of the drug is needed to get the same feeling. This is followed by addiction. With addiction, drugs become the most important part of life. It becomes more important to take drugs than participate in the other usual activities of life. This includes relating to friends and family. Addiction is followed by dependency. Dependency is a condition where drugs are now needed not just to get high, but to feel normal. Addiction cannot be cured but it can be stopped. This often requires outside help and the care of professionals. Treatment centers are listed in the yellow pages under: Cocaine, Narcotics, and Alcoholics Anonymous. Most hospitals and clinics can refer you to a specialized care center. Talk to your caregiver if you need help.   This information is not intended to replace advice given to you by your health care provider. Make sure you discuss any questions you have with your health care provider.   Document Released: 09/09/2004 Document Revised: 04/12/2011 Document Reviewed: 01/23/2014 Elsevier Interactive Patient Education [employer].
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
## 2015-01-29 NOTE — ED Notes (Signed)
Pt moved to room #3.

## 2015-06-09 ENCOUNTER — Other Ambulatory Visit: Payer: Self-pay | Admitting: Family Medicine

## 2015-06-09 DIAGNOSIS — R1903 Right lower quadrant abdominal swelling, mass and lump: Secondary | ICD-10-CM

## 2015-06-09 DIAGNOSIS — R1904 Left lower quadrant abdominal swelling, mass and lump: Secondary | ICD-10-CM

## 2015-06-12 ENCOUNTER — Ambulatory Visit
Admission: RE | Admit: 2015-06-12 | Discharge: 2015-06-12 | Disposition: A | Discharge status: Home / Self Care | Payer: BC Managed Care – PPO | Source: Ambulatory Visit | Attending: Family Medicine | Admitting: Family Medicine

## 2015-06-12 DIAGNOSIS — R1904 Left lower quadrant abdominal swelling, mass and lump: Secondary | ICD-10-CM

## 2015-06-12 DIAGNOSIS — R1903 Right lower quadrant abdominal swelling, mass and lump: Secondary | ICD-10-CM

## 2015-08-12 ENCOUNTER — Telehealth: Payer: Self-pay | Admitting: Cardiology

## 2015-08-12 NOTE — Telephone Encounter (Signed)
Received records from [facility] for appointment on 08/14/15 with Dr Stanford Breed.  Records given to [facility] (medical records) for Dr Jacalyn Lefevre schedule on 08/14/15. lp
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
## 2015-08-13 ENCOUNTER — Encounter: Payer: Self-pay | Admitting: Cardiology

## 2015-08-14 ENCOUNTER — Encounter: Payer: Self-pay | Admitting: *Deleted

## 2015-08-14 ENCOUNTER — Encounter: Payer: BC Managed Care – PPO | Admitting: Cardiology

## 2015-10-07 ENCOUNTER — Telehealth: Payer: Self-pay | Admitting: Cardiovascular Disease

## 2015-10-07 NOTE — Telephone Encounter (Signed)
Received records from [facility] for appointment on 10/31/15 with Dr Oval Linsey.  Records given to [facility] (medical records) for Dr Blenda Mounts schedule on 10/31/15.

## 2015-10-22 ENCOUNTER — Other Ambulatory Visit: Payer: Self-pay | Admitting: Pediatrics

## 2015-10-22 DIAGNOSIS — Z207 Contact with and (suspected) exposure to pediculosis, acariasis and other infestations: Secondary | ICD-10-CM

## 2015-10-22 DIAGNOSIS — Z2089 Contact with and (suspected) exposure to other communicable diseases: Secondary | ICD-10-CM

## 2015-10-22 MED ORDER — PERMETHRIN 5 % EX CREA: 1.0000 "application " | TOPICAL_CREAM | Freq: Once | CUTANEOUS | 0 refills | Status: AC

## 2015-10-22 NOTE — Progress Notes (Signed)
Household contact of scabies

## 2015-10-31 ENCOUNTER — Ambulatory Visit (INDEPENDENT_AMBULATORY_CARE_PROVIDER_SITE_OTHER): Payer: BC Managed Care – PPO | Admitting: Cardiovascular Disease

## 2015-10-31 ENCOUNTER — Encounter: Payer: Self-pay | Admitting: Cardiovascular Disease

## 2015-10-31 VITALS — BP 90/64 | HR 56 | Ht 63.0 in | Wt 183.6 lb

## 2015-10-31 DIAGNOSIS — R001 Bradycardia, unspecified: Secondary | ICD-10-CM | POA: Diagnosis not present

## 2015-10-31 DIAGNOSIS — R0789 Other chest pain: Secondary | ICD-10-CM | POA: Diagnosis not present

## 2015-10-31 DIAGNOSIS — R079 Chest pain, unspecified: Secondary | ICD-10-CM | POA: Diagnosis not present

## 2015-10-31 DIAGNOSIS — R0602 Shortness of breath: Secondary | ICD-10-CM

## 2015-10-31 NOTE — Patient Instructions (Signed)
Medication Instructions:  Your physician recommends that you continue on your current medications as directed. Please refer to the Current Medication list given to you today.  Labwork: none  Testing/Procedures: Your physician has requested that you have en exercise stress myoview. For further information please visit [email or web address]. Please follow instruction sheet, as given.  Follow-Up: Your physician recommends that you schedule a follow-up appointment in: 1 month ov  Any Other Special Instructions Will Be Listed Below (If Applicable). INCREASE YOUR SALT INTAKE  EXERCISE 4 TO 5 TIMES A WEEK FOR 30 TO 31 MINUTES   If you need a refill on your cardiac medications before your next appointment, please call your pharmacy.
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
## 2015-11-06 ENCOUNTER — Telehealth (HOSPITAL_COMMUNITY): Payer: Self-pay

## 2015-11-06 NOTE — Telephone Encounter (Signed)
Encounter complete. 

## 2015-11-07 ENCOUNTER — Ambulatory Visit (HOSPITAL_COMMUNITY)
Admission: RE | Admit: 2015-11-07 | Discharge: 2015-11-07 | Disposition: A | Discharge status: Home / Self Care | Payer: BC Managed Care – PPO | Source: Ambulatory Visit | Attending: Cardiology | Admitting: Cardiology

## 2015-11-07 ENCOUNTER — Encounter: Payer: Self-pay | Admitting: Cardiovascular Disease

## 2015-11-07 DIAGNOSIS — R001 Bradycardia, unspecified: Secondary | ICD-10-CM | POA: Insufficient documentation

## 2015-11-07 DIAGNOSIS — R0789 Other chest pain: Secondary | ICD-10-CM | POA: Insufficient documentation

## 2015-11-07 DIAGNOSIS — R0602 Shortness of breath: Secondary | ICD-10-CM | POA: Insufficient documentation

## 2015-11-07 DIAGNOSIS — R079 Chest pain, unspecified: Secondary | ICD-10-CM | POA: Insufficient documentation

## 2015-11-07 DIAGNOSIS — I501 Left ventricular failure: Secondary | ICD-10-CM | POA: Insufficient documentation

## 2015-11-07 LAB — MYOCARDIAL PERFUSION IMAGING
CHL CUP NUCLEAR SDS: 1
CHL CUP NUCLEAR SRS: 2
CHL CUP RESTING HR STRESS: 56 {beats}/min
CSEPHR: 85 %
CSEPPHR: 157 {beats}/min
Estimated workload: 10.1 METS
Exercise duration (min): 8 min
Exercise duration (sec): 20 s
LV sys vol: 39 mL
LVDIAVOL: 80 mL (ref 46–106)
MPHR: 184 {beats}/min
NUC STRESS TID: 1.21
RPE: 19
SSS: 3

## 2015-11-07 MED ORDER — TECHNETIUM TC 99M TETROFOSMIN IV KIT
31.8000 | PACK | Freq: Once | INTRAVENOUS | Status: AC | PRN
  Administered 2015-11-07: 31.8 via INTRAVENOUS
  Filled 2015-11-07: qty 32

## 2015-11-07 MED ORDER — TECHNETIUM TC 99M TETROFOSMIN IV KIT
10.1000 | PACK | Freq: Once | INTRAVENOUS | Status: AC | PRN
  Administered 2015-11-07: 10.1 via INTRAVENOUS
  Filled 2015-11-07: qty 10

## 2015-11-28 ENCOUNTER — Ambulatory Visit: Payer: BC Managed Care – PPO | Admitting: Cardiovascular Disease

## 2016-09-29 ENCOUNTER — Emergency Department (HOSPITAL_COMMUNITY)
Admission: EM | Admit: 2016-09-29 | Discharge: 2016-09-29 | Disposition: A | Discharge status: Home / Self Care | Payer: BC Managed Care – PPO | Attending: Emergency Medicine | Admitting: Emergency Medicine

## 2016-09-29 ENCOUNTER — Encounter (HOSPITAL_COMMUNITY): Payer: Self-pay

## 2016-09-29 DIAGNOSIS — Z5321 Procedure and treatment not carried out due to patient leaving prior to being seen by health care provider: Secondary | ICD-10-CM | POA: Diagnosis not present

## 2016-09-29 DIAGNOSIS — R109 Unspecified abdominal pain: Secondary | ICD-10-CM | POA: Insufficient documentation

## 2016-09-29 LAB — CBC
HCT: 43.2 % (ref 36.0–46.0)
Hemoglobin: 14.2 g/dL (ref 12.0–15.0)
MCH: 30.7 pg (ref 26.0–34.0)
MCHC: 32.9 g/dL (ref 30.0–36.0)
MCV: 93.5 fL (ref 78.0–100.0)
PLATELETS: 262 10*3/uL (ref 150–400)
RBC: 4.62 MIL/uL (ref 3.87–5.11)
RDW: 13.5 % (ref 11.5–15.5)
WBC: 8.1 10*3/uL (ref 4.0–10.5)

## 2016-09-29 LAB — COMPREHENSIVE METABOLIC PANEL
ALK PHOS: 88 U/L (ref 38–126)
ALT: 22 U/L (ref 14–54)
AST: 18 U/L (ref 15–41)
Albumin: 3.9 g/dL (ref 3.5–5.0)
Anion gap: 9 (ref 5–15)
BUN: 7 mg/dL (ref 6–20)
CALCIUM: 9.3 mg/dL (ref 8.9–10.3)
CHLORIDE: 107 mmol/L (ref 101–111)
CO2: 23 mmol/L (ref 22–32)
CREATININE: 0.66 mg/dL (ref 0.44–1.00)
Glucose, Bld: 100 mg/dL — ABNORMAL HIGH (ref 65–99)
Potassium: 4.3 mmol/L (ref 3.5–5.1)
SODIUM: 139 mmol/L (ref 135–145)
Total Bilirubin: 0.5 mg/dL (ref 0.3–1.2)
Total Protein: 6.8 g/dL (ref 6.5–8.1)

## 2016-09-29 LAB — URINALYSIS, ROUTINE W REFLEX MICROSCOPIC
BILIRUBIN URINE: NEGATIVE
Glucose, UA: NEGATIVE mg/dL
HGB URINE DIPSTICK: NEGATIVE
KETONES UR: NEGATIVE mg/dL
Leukocytes, UA: NEGATIVE
Nitrite: NEGATIVE
PROTEIN: NEGATIVE mg/dL
Specific Gravity, Urine: 1.004 — ABNORMAL LOW (ref 1.005–1.030)
pH: 6 (ref 5.0–8.0)

## 2016-09-29 LAB — LIPASE, BLOOD: LIPASE: 43 U/L (ref 11–51)

## 2016-09-29 MED ORDER — ONDANSETRON 4 MG PO TBDP
4.0000 mg | ORAL_TABLET | Freq: Once | ORAL | Status: AC | PRN
Start: 2016-09-29 — End: 2016-09-29
  Administered 2016-09-29: 4 mg via ORAL

## 2016-09-29 MED ORDER — ONDANSETRON 4 MG PO TBDP
ORAL_TABLET | ORAL | Status: AC
  Filled 2016-09-29: qty 1

## 2016-09-29 NOTE — ED Triage Notes (Signed)
Patient complains of Right sided abdominal pain that started this am with nausea that has now spread throughout abdomen. No vomiting, no diarrhea. Complains of urinary frequency

## 2016-09-29 NOTE — ED Notes (Signed)
Pt does not respond when called for room x 3

## 2017-04-12 ENCOUNTER — Encounter: Payer: Self-pay | Admitting: Psychiatry

## 2017-04-12 ENCOUNTER — Other Ambulatory Visit: Payer: Self-pay

## 2017-04-12 ENCOUNTER — Ambulatory Visit: Payer: BC Managed Care – PPO | Admitting: Psychiatry

## 2017-04-12 VITALS — BP 116/78 | HR 71 | Temp 97.6°F | Wt 147.6 lb

## 2017-04-12 DIAGNOSIS — F410 Panic disorder [episodic paroxysmal anxiety] without agoraphobia: Secondary | ICD-10-CM

## 2017-04-12 DIAGNOSIS — F4312 Post-traumatic stress disorder, chronic: Secondary | ICD-10-CM | POA: Diagnosis not present

## 2017-04-12 DIAGNOSIS — F17200 Nicotine dependence, unspecified, uncomplicated: Secondary | ICD-10-CM

## 2017-04-12 DIAGNOSIS — F3162 Bipolar disorder, current episode mixed, moderate: Secondary | ICD-10-CM

## 2017-04-12 DIAGNOSIS — F1420 Cocaine dependence, uncomplicated: Secondary | ICD-10-CM

## 2017-04-12 MED ORDER — GABAPENTIN 100 MG PO CAPS: 100.0000 mg | ORAL_CAPSULE | Freq: Three times a day (TID) | ORAL | 1 refills | Status: DC

## 2017-04-12 MED ORDER — QUETIAPINE FUMARATE 25 MG PO TABS: 25.0000 mg | ORAL_TABLET | Freq: Every day | ORAL | 1 refills | Status: DC
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
## 2017-04-12 NOTE — Patient Instructions (Signed)
Stimulant Use Disorder-Cocaine Cocaine belongs to a group of powerful drugs known as stimulants. Common street names for cocaine include coke, crack, blow, snow, C, powder, and nose candy. Cocaine has some medical uses, but it is often misused because of the effects that it produces. These effects include:  A feeling of extreme pleasure (euphoria).  Alertness.  A high energy level.  Stimulant use disorder is when your stimulant use disrupts your daily life. It may disrupt your relationships and how you do your job. Stimulant use disorder can be dangerous. Cocaine increases your blood pressure and heart rate. Using it can lead to a heart attack or stroke. Cocaine can also make your heart rate irregular and cause seizures. These problems can lead to death. What are the causes? This condition is caused by misusing cocaine. Many people start using cocaine because it makes them feel good. Over time, they get addicted to it. When they try to stop using it, they feel sick. What increases the risk? This condition is more likely to develop in:  People who misuse other drugs.  People with a family history of misusing drugs.  What are the signs or symptoms? Symptoms of this condition include:  Using greater amounts of cocaine than you want to, or using cocaine for longer than you want to.  Trying several times to use less cocaine or to control your cocaine use.  Craving cocaine.  Spending a lot of time getting cocaine, using it, or recovering from its effects.  Having problems at work, at school, at home, or with relationships because of cocaine use.  Giving up or cutting down on important life activities because of cocaine use.  Using cocaine when it is dangerous, such as when driving a car.  Continuing to use cocaine even though it is causing or has led to a physical problem, such as: ? Malnutrition. ? Nosebleeds. ? Chest pain. ? High blood pressure. ? A hole between the part of your  nose that separates your nostrils (perforated nasal septum). ? Lung and kidney damage.  Continuing to use cocaine even though it is causing a mental problem, such as: ? Schizophrenia-like symptoms. ? Depression. ? Bipolar mood swings. ? Anxiety. ? Sleep problems.  Needing more and more cocaine to get the same effect that you want (building up a tolerance).  Having symptoms of withdrawal when you stop using cocaine. Symptoms of withdrawal include: ? Depression. ? Irritability. ? Low energy. ? Restlessness. ? Bad dreams. ? Too little or too much sleep. ? Increased appetite.  How is this diagnosed? This condition is diagnosed with an assessment. During the assessment, your health care provider will ask about your cocaine use and about how it affects your life. Your health care provider may also:  Perform a physical exam or do lab tests to see if you have physical problems resulting from cocaine use.  Screen for drug use.  Refer you to a mental health professional for evaluation.  How is this treated? Treatment for this condition is usually provided by mental health professionals with training in substance use disorders. Treatment may involve:  Counseling. This treatment is also called talk therapy. It is provided by substance use treatment counselors. A counselor can address the reasons you use cocaine and suggest ways to keep you from using it again. The goals of talk therapy are to: ? Find healthy activities to replace using cocaine. ? Identify and avoid what triggers your cocaine use. ? Help you learn how to handle  cravings.  Support groups. Support groups are run by people who have quit using stimulants. They provide emotional support, advice, and guidance.  Medicines.  Follow these instructions at home:  Take over-the-counter and prescription medicines only as told by your health care provider.  Check with your health care provider before starting any new  medicines.  Do not use any products that contain nicotine or tobacco, such as cigarettes and e-cigarettes. If you need help quitting, ask your health care provider.  Keep all follow-up visits as told by your health care provider. This is important. Where to find more information:  [employer] on Drug Abuse: motorcyclefax.com  Substance Abuse and Mental Health Services Administration: ktimeonline.com Contact a health care provider if:  You are not able to take your medicines as told.  You use cocaine again.  Your symptoms get worse. Get help right away if:  You have serious thoughts about hurting yourself or others.  You have a seizure.  You have chest pain.  You have sudden weakness.  You lose some of your vision.  You lose some of your speech. If you ever feel like you may hurt yourself or others, or have thoughts about taking your own life, get help right away. You can go to your nearest emergency department or call:  Your local emergency services (911 in the [place]  A suicide crisis helpline, such as the [place] at (310)572-9176. This is open 24 hours a day.  This information is not intended to replace advice given to you by your health care provider. Make sure you discuss any questions you have with your health care provider. Document Released: 01/16/2000 Document Revised: 10/31/2015 Document Reviewed: 10/31/2015 Elsevier Interactive Patient Education  2018 [employer]. Quetiapine tablets What is this medicine? QUETIAPINE (kwe TYE a peen) is an antipsychotic. It is used to treat schizophrenia and bipolar disorder, also known as manic-depression. This medicine may be used for other purposes; ask your health care provider or pharmacist if you have questions. COMMON BRAND NAME(S): Seroquel What should I tell my health care provider before I take this medicine? They need to know if you have any of these conditions: -brain tumor or  head injury -breast cancer -cataracts -diabetes -difficulty swallowing -heart disease -kidney disease -liver disease -low blood counts, like low white cell, platelet, or red cell counts -low blood pressure or dizziness when standing up -Parkinson's disease -previous heart attack -seizures -suicidal thoughts, plans, or attempt by you or a family member -thyroid disease -an unusual or allergic reaction to quetiapine, other medicines, foods, dyes, or preservatives -pregnant or trying to get pregnant -breast-feeding How should I use this medicine? Take this medicine by mouth. Swallow it with a drink of water. Follow the directions on the prescription label. If it upsets your stomach you can take it with food. Take your medicine at regular intervals. Do not take it more often than directed. Do not stop taking except on the advice of your doctor or health care professional. A special MedGuide will be given to you by the pharmacist with each prescription and refill. Be sure to read this information carefully each time. Talk to your pediatrician regarding the use of this medicine in children. While this drug may be prescribed for children as young as 10 years for selected conditions, precautions do apply. Patients over age 60 years may have a stronger reaction to this medicine and need smaller doses. Overdosage: If you think you have taken too much of this  medicine contact a poison control center or emergency room at once. NOTE: This medicine is only for you. Do not share this medicine with others. What if I miss a dose? If you miss a dose, take it as soon as you can. If it is almost time for your next dose, take only that dose. Do not take double or extra doses. What may interact with this medicine? Do not take this medicine with any of the following medications: -certain medicines for fungal infections like fluconazole, itraconazole, ketoconazole, posaconazole,  voriconazole -cisapride -dofetilide -dronedarone -droperidol -grepafloxacin -halofantrine -phenothiazines like chlorpromazine, mesoridazine, thioridazine -pimozide -sparfloxacin -ziprasidone This medicine may also interact with the following medications: -alcohol -antiviral medicines for HIV or AIDS -certain medicines for blood pressure -certain medicines for depression, anxiety, or psychotic disturbances like haloperidol, lorazepam -certain medicines for diabetes -certain medicines for Parkinson's disease -certain medicines for seizures like carbamazepine, phenobarbital, phenytoin -cimetidine -erythromycin -other medicines that prolong the QT interval (cause an abnormal heart rhythm) -rifampin -steroid medicines like prednisone or cortisone This list may not describe all possible interactions. Give your health care provider a list of all the medicines, herbs, non-prescription drugs, or dietary supplements you use. Also tell them if you smoke, drink alcohol, or use illegal drugs. Some items may interact with your medicine. What should I watch for while using this medicine? Visit your doctor or health care professional for regular checks on your progress. It may be several weeks before you see the full effects of this medicine. Your health care provider may suggest that you have your eyes examined prior to starting this medicine, and every 6 months thereafter. If you have been taking this medicine regularly for some time, do not suddenly stop taking it. You must gradually reduce the dose or your symptoms may get worse. Ask your doctor or health care professional for advice. Patients and their families should watch out for worsening depression or thoughts of suicide. Also watch out for sudden or severe changes in feelings such as feeling anxious, agitated, panicky, irritable, hostile, aggressive, impulsive, severely restless, overly excited and hyperactive, or not being able to sleep. If  this happens, especially at the beginning of antidepressant treatment or after a change in dose, call your health care professional. Dennis Bast may get dizzy or drowsy. Do not drive, use machinery, or do anything that needs mental alertness until you know how this medicine affects you. Do not stand or sit up quickly, especially if you are an older patient. This reduces the risk of dizzy or fainting spells. Alcohol can increase dizziness and drowsiness. Avoid alcoholic drinks. Do not treat yourself for colds, diarrhea or allergies. Ask your doctor or health care professional for advice, some ingredients may increase possible side effects. This medicine can reduce the response of your body to heat or cold. Dress warm in cold weather and stay hydrated in hot weather. If possible, avoid extreme temperatures like saunas, hot tubs, very hot or cold showers, or activities that can cause dehydration such as vigorous exercise. What side effects may I notice from receiving this medicine? Side effects that you should report to your doctor or health care professional as soon as possible: -allergic reactions like skin rash, itching or hives, swelling of the face, lips, or tongue -difficulty swallowing -fast or irregular heartbeat -fever or chills, sore throat -fever with rash, swollen lymph nodes, or swelling of the face -increased hunger or thirst -increased urination -problems with balance, talking, walking -seizures -stiff muscles -suicidal thoughts or other mood  changes -uncontrollable head, mouth, neck, arm, or leg movements -unusually weak or tired Side effects that usually do not require medical attention (report to your doctor or health care professional if they continue or are bothersome): -change in sex drive or performance -constipation -drowsy or dizzy -dry mouth -stomach upset -weight gain This list may not describe all possible side effects. Call your doctor for medical advice about side effects.  You may report side effects to FDA at 1-800-FDA-1088. Where should I keep my medicine? Keep out of the reach of children. Store at room temperature between 15 and 30 degrees C (59 and 86 degrees F). Throw away any unused medicine after the expiration date. NOTE: This sheet is a summary. It may not cover all possible information. If you have questions about this medicine, talk to your doctor, pharmacist, or health care provider.  2018 Elsevier/Gold Standard (2014-07-23 13:07:35) Gabapentin capsules or tablets What is this medicine? GABAPENTIN (GA ba pen tin) is used to control partial seizures in adults with epilepsy. It is also used to treat certain types of nerve pain. This medicine may be used for other purposes; ask your health care provider or pharmacist if you have questions. COMMON BRAND NAME(S): Active-PAC with Gabapentin, Gabarone, Neurontin What should I tell my health care provider before I take this medicine? They need to know if you have any of these conditions: -kidney disease -suicidal thoughts, plans, or attempt; a previous suicide attempt by you or a family member -an unusual or allergic reaction to gabapentin, other medicines, foods, dyes, or preservatives -pregnant or trying to get pregnant -breast-feeding How should I use this medicine? Take this medicine by mouth with a glass of water. Follow the directions on the prescription label. You can take it with or without food. If it upsets your stomach, take it with food.Take your medicine at regular intervals. Do not take it more often than directed. Do not stop taking except on your doctor's advice. If you are directed to break the 600 or 800 mg tablets in half as part of your dose, the extra half tablet should be used for the next dose. If you have not used the extra half tablet within 28 days, it should be thrown away. A special MedGuide will be given to you by the pharmacist with each prescription and refill. Be sure to read  this information carefully each time. Talk to your pediatrician regarding the use of this medicine in children. Special care may be needed. Overdosage: If you think you have taken too much of this medicine contact a poison control center or emergency room at once. NOTE: This medicine is only for you. Do not share this medicine with others. What if I miss a dose? If you miss a dose, take it as soon as you can. If it is almost time for your next dose, take only that dose. Do not take double or extra doses. What may interact with this medicine? Do not take this medicine with any of the following medications: -other gabapentin products This medicine may also interact with the following medications: -alcohol -antacids -antihistamines for allergy, cough and cold -certain medicines for anxiety or sleep -certain medicines for depression or psychotic disturbances -homatropine; hydrocodone -naproxen -narcotic medicines (opiates) for pain -phenothiazines like chlorpromazine, mesoridazine, prochlorperazine, thioridazine This list may not describe all possible interactions. Give your health care provider a list of all the medicines, herbs, non-prescription drugs, or dietary supplements you use. Also tell them if you smoke, drink alcohol, or  use illegal drugs. Some items may interact with your medicine. What should I watch for while using this medicine? Visit your doctor or health care professional for regular checks on your progress. You may want to keep a record at home of how you feel your condition is responding to treatment. You may want to share this information with your doctor or health care professional at each visit. You should contact your doctor or health care professional if your seizures get worse or if you have any new types of seizures. Do not stop taking this medicine or any of your seizure medicines unless instructed by your doctor or health care professional. Stopping your medicine suddenly  can increase your seizures or their severity. Wear a medical identification bracelet or chain if you are taking this medicine for seizures, and carry a card that lists all your medications. You may get drowsy, dizzy, or have blurred vision. Do not drive, use machinery, or do anything that needs mental alertness until you know how this medicine affects you. To reduce dizzy or fainting spells, do not sit or stand up quickly, especially if you are an older patient. Alcohol can increase drowsiness and dizziness. Avoid alcoholic drinks. Your mouth may get dry. Chewing sugarless gum or sucking hard candy, and drinking plenty of water will help. The use of this medicine may increase the chance of suicidal thoughts or actions. Pay special attention to how you are responding while on this medicine. Any worsening of mood, or thoughts of suicide or dying should be reported to your health care professional right away. Women who become pregnant while using this medicine may enroll in the [place] Pregnancy Registry by calling 2505685795. This registry collects information about the safety of antiepileptic drug use during pregnancy. What side effects may I notice from receiving this medicine? Side effects that you should report to your doctor or health care professional as soon as possible: -allergic reactions like skin rash, itching or hives, swelling of the face, lips, or tongue -worsening of mood, thoughts or actions of suicide or dying Side effects that usually do not require medical attention (report to your doctor or health care professional if they continue or are bothersome): -constipation -difficulty walking or controlling muscle movements -dizziness -nausea -slurred speech -tiredness -tremors -weight gain This list may not describe all possible side effects. Call your doctor for medical advice about side effects. You may report side effects to FDA at 1-800-FDA-1088. Where  should I keep my medicine? Keep out of reach of children. This medicine may cause accidental overdose and death if it taken by other adults, children, or pets. Mix any unused medicine with a substance like cat litter or coffee grounds. Then throw the medicine away in a sealed container like a sealed bag or a coffee can with a lid. Do not use the medicine after the expiration date. Store at room temperature between 15 and 30 degrees C (59 and 86 degrees F). NOTE: This sheet is a summary. It may not cover all possible information. If you have questions about this medicine, talk to your doctor, pharmacist, or health care provider.  2018 Elsevier/Gold Standard (2013-03-16 15:26:50)

## 2017-04-24 ENCOUNTER — Emergency Department (HOSPITAL_COMMUNITY)
Admission: EM | Admit: 2017-04-24 | Discharge: 2017-04-25 | Disposition: A | Discharge status: Other Acute Inpatient Hospital | Payer: BC Managed Care – PPO | Attending: Emergency Medicine | Admitting: Emergency Medicine

## 2017-04-24 ENCOUNTER — Emergency Department (HOSPITAL_COMMUNITY): Payer: BC Managed Care – PPO

## 2017-04-24 ENCOUNTER — Encounter (HOSPITAL_COMMUNITY): Payer: Self-pay | Admitting: Emergency Medicine

## 2017-04-24 ENCOUNTER — Other Ambulatory Visit: Payer: Self-pay

## 2017-04-24 ENCOUNTER — Ambulatory Visit (HOSPITAL_COMMUNITY)
Admission: RE | Admit: 2017-04-24 | Discharge: 2017-04-24 | Disposition: A | Discharge status: Home / Self Care | Payer: BC Managed Care – PPO | Source: Home / Self Care | Attending: Psychiatry | Admitting: Psychiatry

## 2017-04-24 DIAGNOSIS — F1910 Other psychoactive substance abuse, uncomplicated: Secondary | ICD-10-CM

## 2017-04-24 DIAGNOSIS — F17210 Nicotine dependence, cigarettes, uncomplicated: Secondary | ICD-10-CM | POA: Diagnosis not present

## 2017-04-24 DIAGNOSIS — Z79899 Other long term (current) drug therapy: Secondary | ICD-10-CM | POA: Insufficient documentation

## 2017-04-24 DIAGNOSIS — F1410 Cocaine abuse, uncomplicated: Secondary | ICD-10-CM | POA: Diagnosis not present

## 2017-04-24 DIAGNOSIS — F319 Bipolar disorder, unspecified: Secondary | ICD-10-CM | POA: Insufficient documentation

## 2017-04-24 DIAGNOSIS — E039 Hypothyroidism, unspecified: Secondary | ICD-10-CM | POA: Insufficient documentation

## 2017-04-24 DIAGNOSIS — R079 Chest pain, unspecified: Secondary | ICD-10-CM | POA: Diagnosis present

## 2017-04-24 LAB — COMPREHENSIVE METABOLIC PANEL
ALT: 18 U/L (ref 14–54)
ANION GAP: 12 (ref 5–15)
AST: 22 U/L (ref 15–41)
Albumin: 4.2 g/dL (ref 3.5–5.0)
Alkaline Phosphatase: 88 U/L (ref 38–126)
BILIRUBIN TOTAL: 0.6 mg/dL (ref 0.3–1.2)
BUN: 10 mg/dL (ref 6–20)
CO2: 26 mmol/L (ref 22–32)
Calcium: 9.8 mg/dL (ref 8.9–10.3)
Chloride: 101 mmol/L (ref 101–111)
Creatinine, Ser: 0.72 mg/dL (ref 0.44–1.00)
Glucose, Bld: 118 mg/dL — ABNORMAL HIGH (ref 65–99)
Potassium: 3.3 mmol/L — ABNORMAL LOW (ref 3.5–5.1)
Sodium: 139 mmol/L (ref 135–145)
TOTAL PROTEIN: 7.2 g/dL (ref 6.5–8.1)

## 2017-04-24 LAB — RAPID URINE DRUG SCREEN, HOSP PERFORMED
AMPHETAMINES: POSITIVE — AB
BARBITURATES: NOT DETECTED
BENZODIAZEPINES: NOT DETECTED
COCAINE: POSITIVE — AB
Opiates: NOT DETECTED
Tetrahydrocannabinol: NOT DETECTED

## 2017-04-24 LAB — CBC
HCT: 48.6 % — ABNORMAL HIGH (ref 36.0–46.0)
Hemoglobin: 16.9 g/dL — ABNORMAL HIGH (ref 12.0–15.0)
MCH: 32 pg (ref 26.0–34.0)
MCHC: 34.8 g/dL (ref 30.0–36.0)
MCV: 92 fL (ref 78.0–100.0)
Platelets: 230 10*3/uL (ref 150–400)
RBC: 5.28 MIL/uL — AB (ref 3.87–5.11)
RDW: 12.7 % (ref 11.5–15.5)
WBC: 9.6 10*3/uL (ref 4.0–10.5)

## 2017-04-24 LAB — TROPONIN I: Troponin I: <0.03 ng/mL (ref <0.03)

## 2017-04-24 LAB — I-STAT BETA HCG BLOOD, ED (MC, WL, AP ONLY): I-stat hCG, quantitative: 6.6 m[IU]/mL — ABNORMAL HIGH (ref <5)

## 2017-04-24 NOTE — H&P (Signed)
Behavioral Health Medical Screening Exam  Lindsey Davenport is an 37 y.o. female who arrived voluntarily to [facility] accompanied by her husband with c/o overwhelming anxiety, depression and suicide ideations. Patient was very tearful and difficult to relax. Patient stated that she was having chest pain a while ago from anxiety but states the chest pain has resolved but she is now having generalized body ache possibly from cocaine withdrawal.  Total Time spent with patient: 30 minutes  Psychiatric Specialty Exam: Physical Exam  Vitals reviewed. Constitutional: She is oriented to person, place, and time. She appears well-developed and well-nourished.  HENT:  Head: Normocephalic and atraumatic.  Eyes: Pupils are equal, round, and reactive to light.  Neck: Normal range of motion.  Cardiovascular: Normal rate, regular rhythm and normal heart sounds.  Respiratory: Effort normal and breath sounds normal.  GI: Soft. Bowel sounds are normal.  Musculoskeletal: Normal range of motion.  Neurological: She is alert and oriented to person, place, and time.  Skin: Skin is warm and dry.    Review of Systems  Psychiatric/Behavioral: Positive for depression, substance abuse and suicidal ideas. The patient is nervous/anxious.     Blood pressure 112/81, pulse 78, temperature 97.8 F (36.6 C), temperature source Oral, resp. rate 18, SpO2 100 %.There is no height or weight on file to calculate BMI.  General Appearance: Casual  Eye Contact:  Fair  Speech:  Clear and Coherent and Normal Rate  Volume:  Normal  Mood:  Anxious and Depressed  Affect:  Congruent, Depressed and Tearful  Thought Process:  Coherent and Goal Directed  Orientation:  Full (Time, Place, and Person)  Thought Content:  WDL and Logical  Suicidal Thoughts:  Yes.  without intent/plan  Homicidal Thoughts:  No  Memory:  Immediate;   Good Recent;   Good Remote;   Fair  Judgement:  Poor  Insight:  Shallow  Psychomotor Activity:  Increased   Concentration: Concentration: Fair and Attention Span: Fair  Recall:  [employer] of Knowledge:Good  Language: Good  Akathisia:  Negative  Handed:  Right  AIMS (if indicated):     Assets:  Communication Skills Desire for Improvement Financial Resources/Insurance Housing Intimacy Social Support  Sleep:       Musculoskeletal: Strength & Muscle Tone: within normal limits Gait & Station: normal Patient leans: N/A  Blood pressure 112/81, pulse 78, temperature 97.8 F (36.6 C), temperature source Oral, resp. rate 18, SpO2 100 %.  Recommendations:  Based on my evaluation the patient appears to have an emergency medical condition for which I recommend the patient be transferred to the emergency department for further evaluation.  Vicenta Aly, NP 04/24/2017, 5:53 PM

## 2017-04-24 NOTE — ED Triage Notes (Addendum)
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 If cleared can go back to [facility]. Pt states chest pain has been on going. Pt very agitated in triage

## 2017-04-24 NOTE — ED Notes (Signed)
Pt states she cant give urine sample at this time. Pt given sandwich and sprite.
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
## 2017-04-24 NOTE — Discharge Instructions (Addendum)
Go back to behavioral health.  You are medically cleared
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
## 2017-04-24 NOTE — ED Notes (Signed)
Bed: WLPT4 Expected date:  Expected time:  Means of arrival:  Comments: 

## 2017-04-25 ENCOUNTER — Encounter (HOSPITAL_COMMUNITY): Payer: Self-pay

## 2017-04-25 ENCOUNTER — Inpatient Hospital Stay (HOSPITAL_COMMUNITY)
Admission: AD | Admit: 2017-04-25 | Discharge: 2017-05-02 | DRG: 885 | Disposition: A | Discharge status: Home / Self Care | Payer: BC Managed Care – PPO | Source: Intra-hospital | Attending: Psychiatry | Admitting: Psychiatry

## 2017-04-25 DIAGNOSIS — Z91410 Personal history of adult physical and sexual abuse: Secondary | ICD-10-CM

## 2017-04-25 DIAGNOSIS — E039 Hypothyroidism, unspecified: Secondary | ICD-10-CM | POA: Diagnosis present

## 2017-04-25 DIAGNOSIS — F410 Panic disorder [episodic paroxysmal anxiety] without agoraphobia: Secondary | ICD-10-CM | POA: Diagnosis present

## 2017-04-25 DIAGNOSIS — Z79899 Other long term (current) drug therapy: Secondary | ICD-10-CM

## 2017-04-25 DIAGNOSIS — Z560 Unemployment, unspecified: Secondary | ICD-10-CM | POA: Diagnosis not present

## 2017-04-25 DIAGNOSIS — R0789 Other chest pain: Secondary | ICD-10-CM | POA: Diagnosis present

## 2017-04-25 DIAGNOSIS — R451 Restlessness and agitation: Secondary | ICD-10-CM | POA: Diagnosis not present

## 2017-04-25 DIAGNOSIS — Z8669 Personal history of other diseases of the nervous system and sense organs: Secondary | ICD-10-CM | POA: Diagnosis not present

## 2017-04-25 DIAGNOSIS — Z818 Family history of other mental and behavioral disorders: Secondary | ICD-10-CM

## 2017-04-25 DIAGNOSIS — F419 Anxiety disorder, unspecified: Secondary | ICD-10-CM | POA: Diagnosis not present

## 2017-04-25 DIAGNOSIS — F1520 Other stimulant dependence, uncomplicated: Secondary | ICD-10-CM | POA: Diagnosis present

## 2017-04-25 DIAGNOSIS — Z888 Allergy status to other drugs, medicaments and biological substances status: Secondary | ICD-10-CM | POA: Diagnosis not present

## 2017-04-25 DIAGNOSIS — H9190 Unspecified hearing loss, unspecified ear: Secondary | ICD-10-CM | POA: Diagnosis present

## 2017-04-25 DIAGNOSIS — Z736 Limitation of activities due to disability: Secondary | ICD-10-CM | POA: Diagnosis not present

## 2017-04-25 DIAGNOSIS — F3130 Bipolar disorder, current episode depressed, mild or moderate severity, unspecified: Secondary | ICD-10-CM | POA: Diagnosis present

## 2017-04-25 DIAGNOSIS — F332 Major depressive disorder, recurrent severe without psychotic features: Secondary | ICD-10-CM | POA: Insufficient documentation

## 2017-04-25 DIAGNOSIS — Z87820 Personal history of traumatic brain injury: Secondary | ICD-10-CM

## 2017-04-25 DIAGNOSIS — R51 Headache: Secondary | ICD-10-CM

## 2017-04-25 DIAGNOSIS — Z813 Family history of other psychoactive substance abuse and dependence: Secondary | ICD-10-CM | POA: Diagnosis not present

## 2017-04-25 DIAGNOSIS — R450 Nervousness: Secondary | ICD-10-CM | POA: Diagnosis not present

## 2017-04-25 DIAGNOSIS — E785 Hyperlipidemia, unspecified: Secondary | ICD-10-CM | POA: Diagnosis present

## 2017-04-25 DIAGNOSIS — F319 Bipolar disorder, unspecified: Secondary | ICD-10-CM | POA: Diagnosis present

## 2017-04-25 DIAGNOSIS — F17210 Nicotine dependence, cigarettes, uncomplicated: Secondary | ICD-10-CM | POA: Diagnosis not present

## 2017-04-25 DIAGNOSIS — G4700 Insomnia, unspecified: Secondary | ICD-10-CM | POA: Diagnosis present

## 2017-04-25 DIAGNOSIS — R45851 Suicidal ideations: Secondary | ICD-10-CM | POA: Diagnosis present

## 2017-04-25 DIAGNOSIS — Z811 Family history of alcohol abuse and dependence: Secondary | ICD-10-CM | POA: Diagnosis not present

## 2017-04-25 DIAGNOSIS — F4310 Post-traumatic stress disorder, unspecified: Secondary | ICD-10-CM | POA: Diagnosis present

## 2017-04-25 DIAGNOSIS — G43909 Migraine, unspecified, not intractable, without status migrainosus: Secondary | ICD-10-CM | POA: Diagnosis present

## 2017-04-25 DIAGNOSIS — F1420 Cocaine dependence, uncomplicated: Secondary | ICD-10-CM | POA: Diagnosis present

## 2017-04-25 DIAGNOSIS — F17213 Nicotine dependence, cigarettes, with withdrawal: Secondary | ICD-10-CM | POA: Diagnosis not present

## 2017-04-25 DIAGNOSIS — Z630 Problems in relationship with spouse or partner: Secondary | ICD-10-CM | POA: Diagnosis not present

## 2017-04-25 MED ORDER — GABAPENTIN 100 MG PO CAPS: 100.0000 mg | ORAL_CAPSULE | Freq: Three times a day (TID) | ORAL | Status: DC

## 2017-04-25 MED ORDER — ATORVASTATIN CALCIUM 40 MG PO TABS: 40.0000 mg | ORAL_TABLET | Freq: Every evening | ORAL | Status: DC

## 2017-04-25 MED ORDER — NICOTINE 21 MG/24HR TD PT24
21.0000 mg | MEDICATED_PATCH | Freq: Every day | TRANSDERMAL | Status: DC
  Filled 2017-04-25 (×8): qty 1

## 2017-04-25 MED ORDER — MAGNESIUM HYDROXIDE 400 MG/5ML PO SUSP: 30.0000 mL | Freq: Every day | ORAL | Status: DC | PRN

## 2017-04-25 MED ORDER — ACETAMINOPHEN 325 MG PO TABS: 650.0000 mg | ORAL_TABLET | Freq: Four times a day (QID) | ORAL | Status: DC | PRN

## 2017-04-25 MED ORDER — TOPIRAMATE 100 MG PO TABS
150.0000 mg | ORAL_TABLET | Freq: Every day | ORAL | Status: DC
Start: 2017-04-25 — End: 2017-04-28
  Administered 2017-04-25 – 2017-04-27 (×3): 150 mg via ORAL
  Filled 2017-04-25 (×5): qty 1.5
  Filled 2017-04-25: qty 2

## 2017-04-25 MED ORDER — TOPIRAMATE 25 MG PO TABS: 150.0000 mg | ORAL_TABLET | Freq: Every day | ORAL | Status: DC

## 2017-04-25 MED ORDER — QUETIAPINE FUMARATE 25 MG PO TABS: 25.0000 mg | ORAL_TABLET | Freq: Every day | ORAL | Status: DC

## 2017-04-25 MED ORDER — ATORVASTATIN CALCIUM 40 MG PO TABS
40.0000 mg | ORAL_TABLET | Freq: Every day | ORAL | Status: DC
  Administered 2017-04-25 – 2017-05-01 (×7): 40 mg via ORAL
  Filled 2017-04-25 (×9): qty 1

## 2017-04-25 MED ORDER — LEVOTHYROXINE SODIUM 75 MCG PO TABS
75.0000 ug | ORAL_TABLET | Freq: Every day | ORAL | Status: DC
  Administered 2017-04-26 – 2017-05-01 (×6): 75 ug via ORAL
  Filled 2017-04-25 (×9): qty 1

## 2017-04-25 MED ORDER — QUETIAPINE FUMARATE 25 MG PO TABS
25.0000 mg | ORAL_TABLET | Freq: Every day | ORAL | Status: DC
Start: 2017-04-25 — End: 2017-04-25

## 2017-04-25 MED ORDER — QUETIAPINE FUMARATE 100 MG PO TABS
100.0000 mg | ORAL_TABLET | Freq: Every day | ORAL | Status: DC
  Administered 2017-04-25 – 2017-04-27 (×3): 100 mg via ORAL
  Filled 2017-04-25 (×6): qty 1

## 2017-04-25 MED ORDER — HYDROXYZINE HCL 50 MG PO TABS
50.0000 mg | ORAL_TABLET | Freq: Four times a day (QID) | ORAL | Status: DC | PRN
  Administered 2017-04-25 – 2017-05-01 (×5): 50 mg via ORAL
  Filled 2017-04-25 (×5): qty 1

## 2017-04-25 MED ORDER — ALUM & MAG HYDROXIDE-SIMETH 200-200-20 MG/5ML PO SUSP: 30.0000 mL | ORAL | Status: DC | PRN

## 2017-04-25 MED ORDER — CITALOPRAM HYDROBROMIDE 10 MG PO TABS: 80.0000 mg | ORAL_TABLET | Freq: Every day | ORAL | Status: DC

## 2017-04-25 MED ORDER — LEVOTHYROXINE SODIUM 75 MCG PO TABS
75.0000 ug | ORAL_TABLET | Freq: Every day | ORAL | Status: DC
  Filled 2017-04-25: qty 1

## 2017-04-25 MED ORDER — GABAPENTIN 100 MG PO CAPS
100.0000 mg | ORAL_CAPSULE | Freq: Three times a day (TID) | ORAL | Status: DC
  Administered 2017-04-25 – 2017-04-27 (×7): 100 mg via ORAL
  Filled 2017-04-25 (×11): qty 1

## 2017-04-25 NOTE — Plan of Care (Signed)
  Problem: Education: Goal: Knowledge of [facility] Education information/materials will improve Outcome: Progressing   Problem: Safety: Goal: Periods of time without injury will increase Outcome: Progressing   Comment: Patient is adjusting to the unit as a new admission. Patient has been educated about rules and expectations. Patient denies questions and contracts for safety on the unit at this time.

## 2017-04-25 NOTE — Tx Team (Signed)
Initial Treatment Plan 04/25/2017 11:35 AM Estephania Licciardi [place] SWN:[id number]    PATIENT STRESSORS: Legal issue Medication change or noncompliance Substance abuse Traumatic event   PATIENT STRENGTHS: Financial means Motivation for treatment/growth Supportive family/friends   PATIENT IDENTIFIED PROBLEMS: "detox"  "I don't know, I'm at the end of my rope"  Substance Abuse  Ineffective Coping               DISCHARGE CRITERIA:  Ability to meet basic life and health needs Adequate post-discharge living arrangements Improved stabilization in mood, thinking, and/or behavior Medical problems require only outpatient monitoring Motivation to continue treatment in a less acute level of care Need for constant or close observation no longer present Reduction of life-threatening or endangering symptoms to within safe limits Safe-care adequate arrangements made Verbal commitment to aftercare and medication compliance Withdrawal symptoms are absent or subacute and managed without 24-hour nursing intervention  PRELIMINARY DISCHARGE PLAN: Outpatient therapy  PATIENT/FAMILY INVOLVEMENT: This treatment plan has been presented to and reviewed with the patient, LENEE FRANZE.  The patient and family have been given the opportunity to ask questions and make suggestions.  Annia Friendly, RN 04/25/2017, 11:35 AM
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
## 2017-04-25 NOTE — ED Notes (Signed)
[facility] called for transportation to [facility]. 

## 2017-04-25 NOTE — Progress Notes (Signed)
Pt did not attend group this evening.  
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
## 2017-04-25 NOTE — BHH Counselor (Signed)
Pt was accepted to [facility] by Arville Go, [facility] and assigned to room/bed: 305-1. Pt can come after 0830. Updated disposition discussed with Kallie Locks, RN. Support paperwork is completed and faxed.   Vertell Novak, MS, [employer], [facility] Triage Specialist [phone]

## 2017-04-25 NOTE — Progress Notes (Signed)
D  Pt has isolated to her room this evening   She did come to the medication window for medications    She endorses depression and anxiety    She did not attend group and is not forthcoming when answering questions  A   Verbal support given    Medications administered and effectiveness monitored    Q 15 min checks  R    Pt is safe at present time
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
## 2017-04-25 NOTE — ED Notes (Signed)
Pt to have bed available after shift change at [facility].

## 2017-04-26 DIAGNOSIS — F17210 Nicotine dependence, cigarettes, uncomplicated: Secondary | ICD-10-CM

## 2017-04-26 DIAGNOSIS — Z630 Problems in relationship with spouse or partner: Secondary | ICD-10-CM

## 2017-04-26 DIAGNOSIS — R451 Restlessness and agitation: Secondary | ICD-10-CM

## 2017-04-26 DIAGNOSIS — R450 Nervousness: Secondary | ICD-10-CM

## 2017-04-26 DIAGNOSIS — F419 Anxiety disorder, unspecified: Secondary | ICD-10-CM

## 2017-04-26 NOTE — BHH Group Notes (Signed)
[place] Group Notes:  (Nursing/MHT/Case Management/Adjunct)  Date:  04/26/2017  Time:  3:15 pm  Type of Therapy:  Group Therapy  Participation Level:  Did Not Attend  Participation Quality:    Affect:    Cognitive:    Insight:    Engagement in Group:    Modes of Intervention:    Summary of Progress/Problems:  Cammy Copa 04/26/2017, 7:54 PM

## 2017-04-26 NOTE — Progress Notes (Signed)
DAR NOTE: Pt present with flat affect and depressed mood in the unit. Pt has been isolating herself and has been in bed most of the time. Pt stated she is sad and depressed because her husband is divorcing due to her drug use. Pt is requesting to referred to substance abuse treatment  Center. Pt denies physical pain, took all her meds as scheduled. As per self inventory, pt had a poor night sleep, fair appetite, low energy, and poor concentration. Pt rate depression at 10, hopeless ness at 1, and anxiety at 10. Pt's safety ensured with 15 minute and environmental checks. Pt currently denies SI/HI and A/V hallucinations. Pt verbally agrees to seek staff if SI/HI or A/VH occurs and to consult with staff before acting on these thoughts. Will continue POC.

## 2017-04-26 NOTE — BHH Group Notes (Signed)
[facility] Group Therapy      04/26/2017 3:09 PM  Type of Therapy: Mental Health Association Presentation  Participation Level: DID NOT ATTEND   Participation Quality:   Affect:   Cognitive:   Insight:   Engagement in Therapy:  Modes of Intervention: Discussion, Education and Socialization  Summary of Progress/Problems:   DID NOT [place] Clinical Social Worker

## 2017-04-26 NOTE — Progress Notes (Signed)
Pt came to the morning orientation/goals group and stated that her goal for the day is to "make it through the day an find out information on treatment centers once she is discharged".
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
## 2017-04-27 NOTE — [facility] Group Notes (Signed)
LCSW Group Therapy Note 04/27/2017 2:20 PM  Type of Therapy/Topic: Group Therapy: Feelings about Diagnosis  Participation Level: Did Not Attend   Description of Group:  This group will allow patients to explore their thoughts and feelings about diagnoses they have received. Patients will be guided to explore their level of understanding and acceptance of these diagnoses. Facilitator will encourage patients to process their thoughts and feelings about the reactions of others to their diagnosis and will guide patients in identifying ways to discuss their diagnosis with significant others in their lives. This group will be process-oriented, with patients participating in exploration of their own experiences, giving and receiving support, and processing challenge from other group members.  Therapeutic Goals: 1. Patient will demonstrate understanding of diagnosis as evidenced by identifying two or more symptoms of the disorder 2. Patient will be able to express two feelings regarding the diagnosis 3. Patient will demonstrate their ability to communicate their needs through discussion and/or role play  Summary of Patient Progress:  Invited, chose not to attend.    Therapeutic Modalities:  Cognitive Behavioral Therapy Brief Therapy Feelings Identification    [place] Clinical Social Worker

## 2017-04-27 NOTE — Tx Team (Signed)
Interdisciplinary Treatment and Diagnostic Plan Update  04/27/2017 Time of Session: 9:20am Lindsey Davenport MRN: [id number]  Principal Diagnosis: Bipolar I disorder, most recent episode depressed (HCC)  Secondary Diagnoses: Principal Problem:   Bipolar I disorder, most recent episode depressed (HCC) Active Problems:   Cocaine use disorder, severe, dependence (HCC)   Amphetamine use disorder, severe (HCC)   Current Medications:  Current Facility-Administered Medications  Medication Dose Route Frequency Provider Last Rate Last Dose  . acetaminophen (TYLENOL) tablet 650 mg  650 mg Oral Q6H PRN Berry, Jason A, NP      . alum & mag hydroxide-simeth (MAALOX/MYLANTA) 200-200-20 MG/5ML suspension 30 mL  30 mL Oral Q4H PRN Berry, Jason A, NP      . atorvastatin (LIPITOR) tablet 40 mg  40 mg Oral q1800 Nwoko, Agnes I, NP   40 mg at 04/26/17 1836  . gabapentin (NEURONTIN) capsule 100 mg  100 mg Oral TID Nwoko, Agnes I, NP   100 mg at 04/26/17 1638  . hydrOXYzine (ATARAX/VISTARIL) tablet 50 mg  50 mg Oral Q6H PRN [place], Christopher T, MD   50 mg at 04/25/17 2152  . levothyroxine (SYNTHROID, LEVOTHROID) tablet 75 mcg  75 mcg Oral QAC breakfast Nwoko, Agnes I, NP   75 mcg at 04/27/17 0640  . magnesium hydroxide (MILK OF MAGNESIA) suspension 30 mL  30 mL Oral Daily PRN Berry, Jason A, NP      . nicotine (NICODERM CQ - dosed in mg/24 hours) patch 21 mg  21 mg Transdermal Daily [place], Christopher T, MD      . QUEtiapine (SEROQUEL) tablet 100 mg  100 mg Oral QHS Nwoko, Agnes I, NP   100 mg at 04/26/17 2210  . topiramate (TOPAMAX) tablet 150 mg  150 mg Oral QHS Nwoko, Agnes I, NP   150 mg at 04/26/17 2210   PTA Medications: Medications Prior to Admission  Medication Sig Dispense Refill Last Dose  . atorvastatin (LIPITOR) 40 MG tablet every evening.  2 Past Week at Unknown time  . citalopram (CELEXA) 40 MG tablet Take 80 mg by mouth daily.   Past Week at Unknown time  . gabapentin  (NEURONTIN) 100 MG capsule Take 1 capsule (100 mg total) by mouth 3 (three) times daily. 90 capsule 1 Past Week at Unknown time  . levothyroxine (SYNTHROID, LEVOTHROID) 75 MCG tablet Take 75 mcg by mouth daily before breakfast.   Past Week at Unknown time  . Multiple Vitamin (MULTIVITAMIN WITH MINERALS) TABS tablet Take 1 tablet by mouth daily.   Past Week at Unknown time  . QUEtiapine (SEROQUEL) 25 MG tablet Take 1 tablet (25 mg total) by mouth at bedtime. 30 tablet 1 Past Week at Unknown time  . SUMAtriptan (IMITREX) 100 MG tablet Take 1 tablet (100 mg total) by mouth every 2 (two) hours as needed for migraine. May repeat in 2 hours if headache persists or recurs: For migraine headaches 10 tablet 0 Past Month at Unknown time  . topiramate (TOPAMAX) 50 MG tablet Take 3 tablets (150 mg total) by mouth at bedtime. For mood control/headache pains (Patient taking differently: Take 200 mg by mouth at bedtime. Take 4 tablets by mouth daily. For mood control/headache pains) 60 tablet 0 Past Month at Unknown time    Patient Stressors: Legal issue Medication change or noncompliance Substance abuse Traumatic event  Patient Strengths: Financial means Motivation for treatment/growth Supportive family/friends  Treatment Modalities: Medication Management, Group therapy, Case management,  1 to 1 session with clinician, Psychoeducation,   Recreational therapy.   Physician Treatment Plan for Primary Diagnosis: Bipolar I disorder, most recent episode depressed ([place]) Long Term Goal(s): Improvement in symptoms so as ready for discharge Improvement in symptoms so as ready for discharge   Short Term Goals: Ability to identify changes in lifestyle to reduce recurrence of condition will improve Ability to demonstrate self-control will improve Ability to identify and develop effective coping behaviors will improve Compliance with prescribed medications will improve Ability to identify triggers associated with  substance abuse/mental health issues will improve  Medication Management: Evaluate patient's response, side effects, and tolerance of medication regimen.  Therapeutic Interventions: 1 to 1 sessions, Unit Group sessions and Medication administration.  Evaluation of Outcomes: Not Met  Physician Treatment Plan for Secondary Diagnosis: Principal Problem:   Bipolar I disorder, most recent episode depressed ([place]) Active Problems:   Cocaine use disorder, severe, dependence ([place])   Amphetamine use disorder, severe ([place])  Long Term Goal(s): Improvement in symptoms so as ready for discharge Improvement in symptoms so as ready for discharge   Short Term Goals: Ability to identify changes in lifestyle to reduce recurrence of condition will improve Ability to demonstrate self-control will improve Ability to identify and develop effective coping behaviors will improve Compliance with prescribed medications will improve Ability to identify triggers associated with substance abuse/mental health issues will improve     Medication Management: Evaluate patient's response, side effects, and tolerance of medication regimen.  Therapeutic Interventions: 1 to 1 sessions, Unit Group sessions and Medication administration.  Evaluation of Outcomes: Not Met   RN Treatment Plan for Primary Diagnosis: Bipolar I disorder, most recent episode depressed ([place]) Long Term Goal(s): Knowledge of disease and therapeutic regimen to maintain health will improve  Short Term Goals: Ability to remain free from injury will improve, Ability to verbalize frustration and anger appropriately will improve, Ability to demonstrate self-control, Ability to participate in decision making will improve, Ability to verbalize feelings will improve, Ability to disclose and discuss suicidal ideas, Ability to identify and develop effective coping behaviors will improve and Compliance with prescribed medications will improve  Medication  Management: RN will administer medications as ordered by provider, will assess and evaluate patient's response and provide education to patient for prescribed medication. RN will report any adverse and/or side effects to prescribing provider.  Therapeutic Interventions: 1 on 1 counseling sessions, Psychoeducation, Medication administration, Evaluate responses to treatment, Monitor vital signs and CBGs as ordered, Perform/monitor CIWA, COWS, AIMS and Fall Risk screenings as ordered, Perform wound care treatments as ordered.  Evaluation of Outcomes: Not Met   LCSW Treatment Plan for Primary Diagnosis: Bipolar I disorder, most recent episode depressed ([place]) Long Term Goal(s): Safe transition to appropriate next level of care at discharge, Engage patient in therapeutic group addressing interpersonal concerns.  Short Term Goals: Engage patient in aftercare planning with referrals and resources, Increase social support, Increase ability to appropriately verbalize feelings, Increase emotional regulation, Facilitate acceptance of mental health diagnosis and concerns, Facilitate patient progression through stages of change regarding substance use diagnoses and concerns, Identify triggers associated with mental health/substance abuse issues and Increase skills for wellness and recovery  Therapeutic Interventions: Assess for all discharge needs, 1 to 1 time with Social worker, Explore available resources and support systems, Assess for adequacy in community support network, Educate family and significant other(s) on suicide prevention, Complete Psychosocial Assessment, Interpersonal group therapy.  Evaluation of Outcomes: Not Met   Progress in Treatment: Attending groups: No. Participating in groups: No. Taking medication as  prescribed: Yes. Toleration medication: Yes. Family/Significant other contact made: No, will contact:  patient refusing consent Patient understands diagnosis: Yes. Discussing  patient identified problems/goals with staff: Yes. Medical problems stabilized or resolved: Yes. Denies suicidal/homicidal ideation: Yes. Issues/concerns per patient self-inventory: No. Other:   New problem(s) identified: None  New Short Term/Long Term Goal(s): Detox, medication stabilization, elimination of SI thoughts, development of comprehensive mental wellness plan.   Patient Goals: "I just want to get clean and stay sober"  Discharge Plan or Barriers: CSW will continue to assess for an appropriate discharge plan.   Reason for Continuation of Hospitalization: Anxiety Depression Medication stabilization Withdrawal symptoms  Estimated Length of Stay: Friday, 04/29/17  Attendees: Patient: Lindsey Davenport 04/27/2017 9:06 AM  Physician: Dr. Maris Berger 04/27/2017 9:06 AM  Nursing: Loletta Specter, RN 04/27/2017 9:06 AM  RN Care Manager: Rhunette Croft 04/27/2017 9:06 AM  Social Worker: Radonna Ricker, [place] 04/27/2017 9:06 AM  Recreational Therapist: X 04/27/2017 9:06 AM  Other: X 04/27/2017 9:06 AM  Other: x 04/27/2017 9:06 AM  Other:X 04/27/2017 9:06 AM    Scribe for Treatment Team: Marylee Floras, [place] 04/27/2017 9:06 AM

## 2017-04-27 NOTE — Progress Notes (Signed)
DAR Note: Lindsey Davenport slept all shift. Woke up only for med. Reports depression and stated 'I just want to sleep". Tearful during med time. Patient cannot explain while she is tearful but stated "I just want to sleep". Denies SI and contracts for safety. Denies pain, HI, AH/VH at this time.  Staff offered support as needed. Safety checks maintained at all times. Will continue to monitor patient.

## 2017-04-27 NOTE — Progress Notes (Signed)
Recreation Therapy Notes  Date: 3.27.19 Time: 9:30 a.m. Location: [address] Dayroom   Group Topic: Stress Management   Goal Area(s) Addresses:  Goal 1.1: To reduce stress  -Patient will report feeling a reduction in stress level  -Patient will identify the importance of stress management  -Patient will participate during stress management group treatment     Intervention: Stress Management   Activity: Guided Imagery- Patients were in a peaceful environment with soft lighting enhancing patients mood. Patients were read a guided imagery script to help decrease stress levels   Education: Stress Management, Discharge Planning.    Education Outcome: Acknowledges edcuation/In group clarification offered/Needs additional education   Clinical Observations/Feedback:: Patient did not attend     Ranell Patrick, Recreation Therapy Intern   Ranell Patrick 04/27/2017 8:24 AM
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
## 2017-04-27 NOTE — Progress Notes (Signed)
D:Pt is sad and tearful in her room talking about using cocaine and meth in the past three weeks. She says that she has four children ages 9,18,11 and 35. Pt reports that she is worried about where she will go when discharged as she has been having relationship problems with her husband.  A:Offered support, encouragement and 15 minute checks.  R:Pt has passive si thoughts and contracts with staff for safety. Safety maintained on the unit.

## 2017-04-27 NOTE — Progress Notes (Signed)
Pt continues to be tearful and sad.  She has remained in her room in bed all evening, only coming out to get her hs meds.  She is worried about her husband not allowing her to return home and where she will go from here.  Pt endorses passive suicidal thoughts, but contracts for safety.  Pt denies having any withdrawal symptoms at this time.  She denies HI/AVH.  Support and encouragement offered.  Pt was encouraged to make her needs known to staff.  Discharge plans are in process.  Safety maintained with q15 minute checks.

## 2017-04-28 MED ORDER — QUETIAPINE FUMARATE 50 MG PO TABS
150.0000 mg | ORAL_TABLET | Freq: Every day | ORAL | Status: DC
  Administered 2017-04-28 – 2017-05-01 (×4): 150 mg via ORAL
  Filled 2017-04-28 (×6): qty 3

## 2017-04-28 MED ORDER — TOPIRAMATE 100 MG PO TABS
200.0000 mg | ORAL_TABLET | Freq: Every day | ORAL | Status: DC
  Administered 2017-04-28 – 2017-05-01 (×4): 200 mg via ORAL
  Filled 2017-04-28 (×6): qty 2
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
## 2017-04-28 NOTE — Progress Notes (Addendum)
Adult Psychoeducational Group Note  Date:  04/28/2017 Time:  4:00 PM  Group Topic/Focus:  Crisis Management:   The purpose of this group is to help patients create a crisis plan for use upon discharge or in the future, as needed.  Participation Level:  Minimal  Participation Quality:  Poor  Affect:  Flat  Cognitive:  Alert and Oriented  Insight: Lacking and Limited  Engagement in Group:  Limited and Poor  Modes of Intervention:  Discussion and Education  Additional Comments:  Patient attended group for the entirety of the session but did not contribute. Patient listened but declined to contribute to the topic of identifying the definition of a crisis or coping skills.   Lindsey Davenport 04/28/2017, 5:58 PM

## 2017-04-28 NOTE — [facility] Suicide Risk Assessment (Signed)
[place] INPATIENT:  Family/Significant Other Suicide Prevention Education  Suicide Prevention Education:  Patient Refusal for Family/Significant Other Suicide Prevention Education: The patient Lindsey Davenport has refused to provide written consent for family/significant other to be provided Family/Significant Other Suicide Prevention Education during admission and/or prior to discharge.  Physician notified.  SPE completed with pt, as pt refused to consent to family contact. SPI pamphlet provided to pt and pt was encouraged to share information with support network, ask questions, and talk about any concerns relating to SPE. Pt denies access to guns/firearms and verbalized understanding of information provided. Mobile Crisis information also provided to pt.   Bryen Hinderman N Smart LCSW 04/28/2017, 3:31 PM

## 2017-04-28 NOTE — [facility] Group Notes (Signed)
LCSW Group Therapy Note   04/28/2017 1:15pm   Type of Therapy and Topic:  Group Therapy:  Overcoming Obstacles   Participation Level:  Active   Description of Group:    In this group patients will be encouraged to explore what they see as obstacles to their own wellness and recovery. They will be guided to discuss their thoughts, feelings, and behaviors related to these obstacles. The group will process together ways to cope with barriers, with attention given to specific choices patients can make. Each patient will be challenged to identify changes they are motivated to make in order to overcome their obstacles. This group will be process-oriented, with patients participating in exploration of their own experiences as well as giving and receiving support and challenge from other group members.   Therapeutic Goals: 1. Patient will identify personal and current obstacles as they relate to admission. 2. Patient will identify barriers that currently interfere with their wellness or overcoming obstacles.  3. Patient will identify feelings, thought process and behaviors related to these barriers. 4. Patient will identify two changes they are willing to make to overcome these obstacles:      Summary of Patient Progress   Lindsey Davenport was attentive and engaged during today's processing group. She shared that her biggest obstacle involves getting permission from her probation officer to go to [employer]. "I hope he lets me go. I really need this." Lindsey Davenport continues to show progress in the group setting with improving insight.    Therapeutic Modalities:   Cognitive Behavioral Therapy Solution Focused Therapy Motivational Interviewing Relapse Prevention Therapy  Lindsey Relic Smart, LCSW 04/28/2017 2:36 PM

## 2017-04-28 NOTE — Progress Notes (Signed)
Nursing Progress Note [phone]  Data: Patient presents with sad/depressed/anxious affect/mood. Patient is isolative to her room and reports poor sleep and appetite. Patient encouraged to go to groups but declines. Patient forwards little with writer and becomes tearful during interactions. Patient refused her scheduled nicotine patch. Patient observed ambulating to the bathroom in no acute distress. Patient reports she wants to know "my discharge plan". Patient reports passive SI with no current plan and denies HI/AVH or pain. Patient contracts for safety on the unit at this time. Patient provided but declined to complete their self-inventory sheet.  Action: Patient educated about and provided medication per provider's orders. Patient safety maintained with q15 min safety checks. High fall risk precautions in place. Emotional support given. 1:1 interaction and active listening provided. Patient encouraged to attend meals and groups. Labs, vital signs and patient behavior monitored throughout shift. Patient encouraged to work on treatment plan and goals.  Response: Patient receptive to interaction with nurse. Patient remains safe on the unit at this time. Patient remains depressed and isolative to her room. Will continue to support and monitor.

## 2017-04-28 NOTE — Plan of Care (Signed)
  Problem: Activity: Goal: Interest or engagement in activities will improve Outcome: Progressing   Problem: Safety: Goal: Periods of time without injury will increase Outcome: Progressing   Comments: Patient contracts for safety on the unit. Patient is observed up in the milieu interacting with staff. Patient needs encouragement to attends groups.

## 2017-04-29 MED ORDER — NALTREXONE HCL 50 MG PO TABS
50.0000 mg | ORAL_TABLET | ORAL | Status: DC
  Administered 2017-04-29 – 2017-05-01 (×2): 50 mg via ORAL
  Filled 2017-04-29 (×4): qty 1

## 2017-04-29 NOTE — Progress Notes (Signed)
Adult Psychoeducational Group Note  Date:  04/29/2017 Time:  4:00 PM  Group Topic/Focus: Open Discussion Led by two RNs, patients were encouraged to talk about what brought them to the hospital, what coping skills have helped them in the past, and bring up any concerns they had for staff. Staff opened the discussion for patients to discuss any topic.  Participation Level:  Active  Participation Quality:  Appropriate, Attentive, Sharing and Supportive  Affect:  Appropriate  Cognitive:  Alert, Appropriate and Oriented  Insight: Improving  Engagement in Group:  Developing/Improving, Engaged and Supportive  Modes of Intervention:  Clarification, Discussion, Exploration, Socialization and Support  Additional Comments: Patient reports before admission she was awake for "almost 14 days" and states, "it made me question who I am". Patient reports feeling at her "absolute lowest" and states, "I've been in jail but this feels worse". Patient reports "wanting to see my kids grow up". Patient was tearful at times. Patient demonstrated appropriate insight and was supportive of peers.  Lindsey Davenport 04/29/2017, 5:00 PM

## 2017-04-29 NOTE — Progress Notes (Signed)
Pt was complemented for being up and out of her room this evening as she has been isolating to her room most of the time she has been in the hospital.  Her affect is brighter than yesterday evening.  She says she is still feeling very anxious about her marriage and the prospect of being homeless, but she is also hopeful about being able to go for treatment in [place].  She states she has passive suicidal thoughts from time to time when she thinks of her situation, but she contracts for safety with Probation officer.  She denies HI/AVH.  She has been pleasant and cooperative with staff.  Support and encouragement offered.  Discharge plans are in process.  Safety maintained with q15 minute checks.

## 2017-04-29 NOTE — Progress Notes (Signed)
DAR NOTE: Patient presents with anxious affect and depressed mood. Pt stated she did not sleep well last night, she kept on turn and twisting. Pt stated she not sure of what the husband is going to do about their marriage. Pt been isolating a lot in the room, pt encouraged to stay up and attend the groups and participate in the activities.  Pt is not sure of what to do, but is interested in stopping drugs use. Denies pain, auditory and visual hallucinations.  Rates depression at 8, hopelessness at 8, and anxiety at 8.  Maintained on routine safety checks.  Medications given as prescribed.  Support and encouragement offered as needed.  Will continue to monitor.

## 2017-04-29 NOTE — Progress Notes (Signed)
Pt attend wrap up group her day was a 7. Her goal was discharge and she found out where she's going.

## 2017-04-29 NOTE — Progress Notes (Signed)
D    Pt is appropriate and pleasant    She said today was a good day for her   She said she had visitors and they plan to come again before she is discharged   She reports feeling much more positive about her future    She did talk about getting in touch with her probation officer    She reports much improvement in her mood since she first came into the hospital A   Verbal support given     Medications administered and effectiveness monitored    Q 15 min checks R   Pt is safe and was receptive to verbal support

## 2017-04-29 NOTE — Progress Notes (Signed)
Recreation Therapy Notes  Date: 3.29.19 Time: 9:30 a.m. Location: [address] Dayroom   Group Topic: Stress Management   Goal Area(s) Addresses:  Goal 1.1: To reduce stress  -Patient will report feeling a reduction in stress level  -Patient will identify the importance of stress management  -Patient will participate during stress management group treatment     Behavioral Response: Engaged   Intervention: Stress Management   Activity: Meditation- Patients were in a peaceful environment with soft lighting enhancing patients mood. Patients listened to a mindfulness meditation voiceover to help reduce stress levels   Education: Stress Management, Discharge Planning.    Education Outcome: Acknowledges edcuation/In group clarification offered/Needs additional education   Clinical Observations/Feedback:: Patient attended and participated appropriately during stress management group treatment. Patient reported feeling a reduction in stress level.    Ranell Patrick, Recreation Therapy Intern   Ranell Patrick 04/29/2017 8:20 AM
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
## 2017-04-29 NOTE — [facility] Group Notes (Signed)
LCSW Group Therapy Note  04/29/2017 1:15pm  Type of Therapy and Topic:  Group Therapy:  Feelings around Relapse and Recovery  Participation Level:  Did Not Attend--pt invited. Chose to remain in bed.    Description of Group:    Patients in this group will discuss emotions they experience before and after a relapse. They will process how experiencing these feelings, or avoidance of experiencing them, relates to having a relapse. Facilitator will guide patients to explore emotions they have related to recovery. Patients will be encouraged to process which emotions are more powerful. They will be guided to discuss the emotional reaction significant others in their lives may have to their relapse or recovery. Patients will be assisted in exploring ways to respond to the emotions of others without this contributing to a relapse.  Therapeutic Goals: 1. Patient will identify two or more emotions that lead to a relapse for them 2. Patient will identify two emotions that result when they relapse 3. Patient will identify two emotions related to recovery 4. Patient will demonstrate ability to communicate their needs through discussion and/or role plays   Summary of Patient Progress:  x   Therapeutic Modalities:   Cognitive Behavioral Therapy Solution-Focused Therapy Assertiveness Training Relapse Prevention Therapy   Kimber Relic Smart, LCSW 04/29/2017 2:40 PM

## 2017-04-30 LAB — TSH: TSH: 0.071 u[IU]/mL — ABNORMAL LOW (ref 0.350–4.500)

## 2017-04-30 LAB — HCG, QUANTITATIVE, PREGNANCY: HCG, BETA CHAIN, QUANT, S: 7 m[IU]/mL — AB (ref <5)

## 2017-04-30 MED ORDER — ESCITALOPRAM OXALATE 5 MG PO TABS
5.0000 mg | ORAL_TABLET | Freq: Every day | ORAL | Status: DC
  Administered 2017-04-30 – 2017-05-02 (×3): 5 mg via ORAL
  Filled 2017-04-30 (×6): qty 1

## 2017-04-30 MED ORDER — IBUPROFEN 800 MG PO TABS
800.0000 mg | ORAL_TABLET | Freq: Three times a day (TID) | ORAL | Status: DC | PRN
  Administered 2017-04-30 – 2017-05-01 (×3): 800 mg via ORAL
  Filled 2017-04-30 (×3): qty 1

## 2017-04-30 NOTE — Progress Notes (Signed)
[place] [facility] Group Notes:  (Nursing/MHT/Case Management/Adjunct)  Date:  04/30/2017  Time:  2100 Type of Therapy:  wrap up group  Participation Level:  Active  Participation Quality:  Appropriate, Attentive, Sharing and Supportive  Affect:  Flat  Cognitive:  Appropriate  Insight:  Good  Engagement in Group:  Engaged  Modes of Intervention:  Clarification, Education and Support  Summary of Progress/Problems: Pt shares that she is ready to finally take control of her addiction and stop all the blaming of other people, situations, events, history, and get clean. Pt reports having a good visit today with mom, daughter, and pastor. Pt is grateful for her journey. Pt was tearful when she spoke of her two children aged 50 and 25 whom her parents raised because of her addiction.   Shellia Cleverly 04/30/2017, 10:13 PM

## 2017-04-30 NOTE — Progress Notes (Signed)
D   Pt reports more depression this evening and anxiety     She said her mood is down and is starting to feel more negative about things   She did say she stayed in bed most of the day because she felt so depressed     Pt did attend group this evening  A    Verbal support   Encourage positive thinking    Medications administered and effectiveness monitored    Q 15 min checks R   Pt is safe and receptive to verbal support
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
## 2017-04-30 NOTE — [facility] Group Notes (Signed)
[facility] Group Notes:  (Nursing)  Date:  04/30/2017  Time:  1:15 PM Type of Therapy:  Nurse Education  Participation Level:  Active  Participation Quality:  Appropriate and Attentive  Affect:  Appropriate  Cognitive:  Alert and Appropriate  Insight:  Appropriate and Good  Engagement in Group:  Engaged  Modes of Intervention:  Discussion, Education and Exploration  Summary of Progress/Problems: Group played non competitive learning/communication board game that fosters listening skills as well as self expression. Waymond Cera 04/30/2017, 2:22 PM

## 2017-04-30 NOTE — Progress Notes (Signed)
D: Pt in bed on initial contact this AM. Presents with depressed affect and mood. Fidgety in bed during assessment. Denies SI, HI and AVH. Isolative from peers majority of this shift.  Reports she slept well last night with fair appetite, low energy and poor concentration level. Rates her depression, anxiety and hopelessness all 7/10 on self inventory sheet "just trying to stay clean, positive and focused". "Not now, I just want something for my headache when offered PRN Vistaril". Attended and participated in some groups and was engaged.  A: Emotional support and encouragement provided to pt. Scheduled and PRN medications given per MD's order with verbal education and effects monitored. Safety checks maintained without self harm gestures.  R: Pt has been compliant with current medication regimen. Denies adverse drug reactions when assessed. Off unit for meals and acitivities with peers and staff, returned without issues. POC continues for safety and mood stability.

## 2017-04-30 NOTE — [facility] Group Notes (Signed)
[facility] Group Notes: (Clinical Social Work)   04/30/2017      Type of Therapy:  Group Therapy   Participation Level:  Did Not Attend despite MHT prompting   Selmer Dominion, LCSW 04/30/2017, 12:57 PM

## 2017-05-01 NOTE — Plan of Care (Signed)
Patient verbalizes understanding of information, education provided. 

## 2017-05-01 NOTE — Progress Notes (Signed)
D   Pt reports more depression this evening and anxiety     She reports some improvement    She continues to be anxious but is now saying she is not sure she needs to go to Rehab    Pt did attend group this evening  A    Verbal support   Encourage positive thinking    Medications administered and effectiveness monitored    Q 15 min checks R   Pt is safe and receptive to verbal support

## 2017-05-01 NOTE — Progress Notes (Signed)
D: Patient observed resting in bed this AM, more visible this afternoon. Patient states, "I really want to go home. I have all of these questions and my family is asking, too." Patient's affect flat, depressed with congruent mood. Per self inventory and discussions with writer, rates depression at a 5/10, hopelessness at a 5/10 and anxiety at an 8/10. Rates sleep as fair, appetite as fair, energy as low and concentration as poor.  States goal for today is to "go home, stay focused, talk to doctor." Denies pain, physical complaints.   A: Medicated per orders, prn vistaril given for anxiety. Level III obs in place for safety. Emotional support offered and self inventory reviewed.  Suicide Safety Plan completed and on chart. Strongly encouraged programming participation to show readiness for discharge. Discussed POC with NP.  Fall prevention plan in place and reviewed with patient as pt is a high fall risk due to seizure disorder.   R: Patient verbalizes understanding of POC, falls prevention education. On reassess, patient is calmer. Patient denies SI/HI/AVH and remains safe on level III obs. Will continue to monitor closely and make verbal contact frequently.
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
## 2017-05-01 NOTE — BHH Group Notes (Signed)
[place] LCSW Group Therapy Note  Date/Time:  05/01/2017 9:00  Type of Therapy and Topic:  Group Therapy:  Healthy and Unhealthy Supports  Participation Level:  Did Not Attend   Description of Group:  Patients in this group were introduced to the idea of adding a variety of healthy supports to address the various needs in their lives.Patients discussed what additional healthy supports could be helpful in their recovery and wellness after discharge in order to prevent future hospitalizations.   An emphasis was placed on using counselor, doctor, therapy groups, 12-step groups, and problem-specific support groups to expand supports.  They also worked as a group on developing a specific plan for several patients to deal with unhealthy supports through [place], psychoeducation with loved ones, and even termination of relationships.   Therapeutic Goals:              1)  discuss importance of adding supports to stay well once out of the hospital             2)  compare healthy versus unhealthy supports and identify some examples of each             3)  generate ideas and descriptions of healthy supports that can be added             4)  offer mutual support about how to address unhealthy supports             5)  encourage active participation in and adherence to discharge plan               Summary of Patient Progress:   Pt was invited to attend group but chose not to attend. CSW will continue to encourage pt to attend group throughout their admission.    Therapeutic Modalities:   Motivational Interviewing Brief Solution-Focused Therapy  Alden Hipp, MSW, LCSW 05/01/2017 9:52 AM

## 2017-05-01 NOTE — Progress Notes (Signed)
Patient did attend the evening speaker AA meeting.  
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
## 2017-05-02 MED ORDER — QUETIAPINE FUMARATE 50 MG PO TABS: 150.0000 mg | ORAL_TABLET | Freq: Every day | ORAL | 0 refills | Status: DC

## 2017-05-02 MED ORDER — NALTREXONE HCL 50 MG PO TABS: 50.0000 mg | ORAL_TABLET | ORAL | 0 refills | Status: DC

## 2017-05-02 MED ORDER — HYDROXYZINE HCL 50 MG PO TABS: 50.0000 mg | ORAL_TABLET | Freq: Four times a day (QID) | ORAL | 0 refills | Status: DC | PRN

## 2017-05-02 MED ORDER — TOPIRAMATE 200 MG PO TABS: 200.0000 mg | ORAL_TABLET | Freq: Every day | ORAL | 0 refills | Status: DC

## 2017-05-02 MED ORDER — ESCITALOPRAM OXALATE 5 MG PO TABS: 5.0000 mg | ORAL_TABLET | Freq: Every day | ORAL | 0 refills | Status: DC

## 2017-05-02 NOTE — Progress Notes (Signed)
Recreation Therapy Notes  Date: 4.1.19 Time: 9:30 a.m. Location: [address] Dayroom   Group Topic: Stress Management   Goal Area(s) Addresses:  Goal 1.1: To reduce stress  -Patient will report feeling a reduction in stress level  -Patient will identify the importance of stress management  -Patient will participate during stress management group treatment     Behavioral Response: Engaged   Intervention: Stress Management   Activity: Guided Imagery- Patients were in a peaceful environment with soft lighting enhancing patients mood. Patients were read a guided imagery script to help decrease stress levels   Education: Stress Management, Discharge Planning.    Education Outcome: Acknowledges edcuation/In group clarification offered/Needs additional education   Clinical Observations/Feedback:: Patient attended and participated appropriately during stress management group treatment. Patient reported feeling a reduction in stress level.    Ranell Patrick, Recreation Therapy Intern   Ranell Patrick 05/02/2017 9:07 AM

## 2017-05-02 NOTE — Progress Notes (Addendum)
  [facility] Adult Case Management Discharge Plan :  Will you be returning to the same living situation after discharge:  Yes,  home At discharge, do you have transportation home?: Yes,  family member Do you have the ability to pay for your medications: Yes,  [employer] insurance  Release of information consent forms completed and submitted to medical records by CSW.  Patient to Follow up at: [facility].   Why:  You have been accepted to [facility]; however, you must get approval from [employer] [phone]). He is working with his superiors to get you approval. Please follow-up with him at discharge.  Contact information: [place] [address] [phone]        Ursula Alert, MD Follow up on 05/04/2017.   Specialty:  Psychiatry Why:  Hospital follow-up/medication management on Wed, 05/04/17 at 9:45AM. Thank you.  Contact information: [place]  [address] [phone]           Next level of care provider has access to [place] and Suicide Prevention discussed: Yes,  SPE completedw ith pt; pt declined to consent to collateral contact.  Have you used any form of tobacco in the last 30 days? (Cigarettes, Smokeless Tobacco, Cigars, and/or Pipes): Yes  Has patient been referred to the Quitline?: Patient refused referral  Patient has been referred for addiction treatment: Yes  [employer], LCSW 05/02/2017, 4:15 PM
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
## 2017-05-02 NOTE — Progress Notes (Signed)
Pt discharged home with the husband. Pt was ambulatory, stable and appreciative at that time. All papers and prescriptions were given and valuables returned. Verbal understanding expressed. Denies SI/HI and A/VH. Pt given opportunity to express concerns and ask questions.

## 2017-05-02 NOTE — BHH Group Notes (Signed)
Pt attended spiritual care group on grief and loss facilitated by chaplain Jerene Pitch   Group opened with brief discussion and psycho-social ed around grief and loss in relationships and in relation to self - identifying life patterns, circumstances, changes that cause losses. Established group norm of speaking from own life experience. Group goal of establishing open and affirming space for members to share understanding of and experience with grief, normalize grief experience and provide psycho social education and grief support.  Group engaged in facilitated dialog around group topic.  Engaged with four tasks of mourning.
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
## 2017-05-02 NOTE — Tx Team (Signed)
Interdisciplinary Treatment and Diagnostic Plan Update  05/02/2017 Time of Session: 9:20am Lindsey Davenport MRN: [id number]  Principal Diagnosis: Bipolar I disorder, most recent episode depressed ([place])  Secondary Diagnoses: Principal Problem:   Bipolar I disorder, most recent episode depressed ([place]) Active Problems:   Cocaine use disorder, severe, dependence ([facility])   Amphetamine use disorder, severe ([place])   Current Medications:  Current Facility-Administered Medications  Medication Dose Route Frequency Provider Last Rate Last Dose  . alum & mag hydroxide-simeth (MAALOX/MYLANTA) 200-200-20 MG/5ML suspension 30 mL  30 mL Oral Q4H PRN Lindon Romp A, NP      . atorvastatin (LIPITOR) tablet 40 mg  40 mg Oral q1800 Lindell Spar I, NP   40 mg at 05/01/17 1657  . escitalopram (LEXAPRO) tablet 5 mg  5 mg Oral Daily Money, Travis B, FNP   5 mg at 05/02/17 0830  . hydrOXYzine (ATARAX/VISTARIL) tablet 50 mg  50 mg Oral Q6H PRN Pennelope Bracken, MD   50 mg at 05/01/17 2130  . ibuprofen (ADVIL,MOTRIN) tablet 800 mg  800 mg Oral Q8H PRN Money, Lowry Ram, FNP   800 mg at 05/01/17 2130  . levothyroxine (SYNTHROID, LEVOTHROID) tablet 75 mcg  75 mcg Oral QAC breakfast Lindell Spar I, NP   75 mcg at 05/01/17 0605  . magnesium hydroxide (MILK OF MAGNESIA) suspension 30 mL  30 mL Oral Daily PRN Lindon Romp A, NP      . naltrexone (DEPADE) tablet 50 mg  50 mg Oral Martie Round I, NP   50 mg at 05/01/17 4098  . QUEtiapine (SEROQUEL) tablet 150 mg  150 mg Oral QHS Lindell Spar I, NP   150 mg at 05/01/17 2130  . topiramate (TOPAMAX) tablet 200 mg  200 mg Oral QHS Nwoko, Agnes I, NP   200 mg at 05/01/17 2130   PTA Medications: Medications Prior to Admission  Medication Sig Dispense Refill Last Dose  . atorvastatin (LIPITOR) 40 MG tablet every evening.  2 Past Week at Unknown time  . citalopram (CELEXA) 40 MG tablet Take 80 mg by mouth daily.   Past Week at Unknown time  . gabapentin  (NEURONTIN) 100 MG capsule Take 1 capsule (100 mg total) by mouth 3 (three) times daily. 90 capsule 1 Past Week at Unknown time  . levothyroxine (SYNTHROID, LEVOTHROID) 75 MCG tablet Take 75 mcg by mouth daily before breakfast.   Past Week at Unknown time  . Multiple Vitamin (MULTIVITAMIN WITH MINERALS) TABS tablet Take 1 tablet by mouth daily.   Past Week at Unknown time  . QUEtiapine (SEROQUEL) 25 MG tablet Take 1 tablet (25 mg total) by mouth at bedtime. 30 tablet 1 Past Week at Unknown time  . SUMAtriptan (IMITREX) 100 MG tablet Take 1 tablet (100 mg total) by mouth every 2 (two) hours as needed for migraine. May repeat in 2 hours if headache persists or recurs: For migraine headaches 10 tablet 0 Past Month at Unknown time  . topiramate (TOPAMAX) 50 MG tablet Take 3 tablets (150 mg total) by mouth at bedtime. For mood control/headache pains (Patient taking differently: Take 200 mg by mouth at bedtime. Take 4 tablets by mouth daily. For mood control/headache pains) 60 tablet 0 Past Month at Unknown time    Patient Stressors: Legal issue Medication change or noncompliance Substance abuse Traumatic event  Patient Strengths: Social worker for treatment/growth Supportive family/friends  Treatment Modalities: Medication Management, Group therapy, Case management,  1 to 1 session with clinician, Psychoeducation,  Recreational therapy.   Physician Treatment Plan for Primary Diagnosis: Bipolar I disorder, most recent episode depressed ([place]) Long Term Goal(s): Improvement in symptoms so as ready for discharge Improvement in symptoms so as ready for discharge   Short Term Goals: Ability to identify changes in lifestyle to reduce recurrence of condition will improve Ability to demonstrate self-control will improve Ability to identify and develop effective coping behaviors will improve Compliance with prescribed medications will improve Ability to identify triggers associated with  substance abuse/mental health issues will improve  Medication Management: Evaluate patient's response, side effects, and tolerance of medication regimen.  Therapeutic Interventions: 1 to 1 sessions, Unit Group sessions and Medication administration.  Evaluation of Outcomes: Adequate for discharge   Physician Treatment Plan for Secondary Diagnosis: Principal Problem:   Bipolar I disorder, most recent episode depressed ([place]) Active Problems:   Cocaine use disorder, severe, dependence ([place])   Amphetamine use disorder, severe ([place])  Long Term Goal(s): Improvement in symptoms so as ready for discharge Improvement in symptoms so as ready for discharge   Short Term Goals: Ability to identify changes in lifestyle to reduce recurrence of condition will improve Ability to demonstrate self-control will improve Ability to identify and develop effective coping behaviors will improve Compliance with prescribed medications will improve Ability to identify triggers associated with substance abuse/mental health issues will improve     Medication Management: Evaluate patient's response, side effects, and tolerance of medication regimen.  Therapeutic Interventions: 1 to 1 sessions, Unit Group sessions and Medication administration.  Evaluation of Outcomes: Adequate for discharge   RN Treatment Plan for Primary Diagnosis: Bipolar I disorder, most recent episode depressed ([place]) Long Term Goal(s): Knowledge of disease and therapeutic regimen to maintain health will improve  Short Term Goals: Ability to remain free from injury will improve, Ability to verbalize frustration and anger appropriately will improve, Ability to demonstrate self-control, Ability to participate in decision making will improve, Ability to verbalize feelings will improve, Ability to disclose and discuss suicidal ideas, Ability to identify and develop effective coping behaviors will improve and Compliance with prescribed medications  will improve  Medication Management: RN will administer medications as ordered by provider, will assess and evaluate patient's response and provide education to patient for prescribed medication. RN will report any adverse and/or side effects to prescribing provider.  Therapeutic Interventions: 1 on 1 counseling sessions, Psychoeducation, Medication administration, Evaluate responses to treatment, Monitor vital signs and CBGs as ordered, Perform/monitor CIWA, COWS, AIMS and Fall Risk screenings as ordered, Perform wound care treatments as ordered.  Evaluation of Outcomes: Adequate for discharge   LCSW Treatment Plan for Primary Diagnosis: Bipolar I disorder, most recent episode depressed ([place]) Long Term Goal(s): Safe transition to appropriate next level of care at discharge, Engage patient in therapeutic group addressing interpersonal concerns.  Short Term Goals: Engage patient in aftercare planning with referrals and resources, Increase social support, Increase ability to appropriately verbalize feelings, Increase emotional regulation, Facilitate acceptance of mental health diagnosis and concerns, Facilitate patient progression through stages of change regarding substance use diagnoses and concerns, Identify triggers associated with mental health/substance abuse issues and Increase skills for wellness and recovery  Therapeutic Interventions: Assess for all discharge needs, 1 to 1 time with Social worker, Explore available resources and support systems, Assess for adequacy in community support network, Educate family and significant other(s) on suicide prevention, Complete Psychosocial Assessment, Interpersonal group therapy.  Evaluation of Outcomes: Adequate for discharge   Progress in Treatment: Attending groups: Yes Participating in  groups: Yes Taking medication as prescribed: Yes. Toleration medication: Yes. Family/Significant other contact made: SPE completed with pt; pt declined to  consent to collateral contact.  Patient understands diagnosis: Yes. Discussing patient identified problems/goals with staff: Yes. Medical problems stabilized or resolved: Yes. Denies suicidal/homicidal ideation: Yes. Issues/concerns per patient self-inventory: No. Other:   New problem(s) identified: None  New Short Term/Long Term Goal(s): Detox, medication stabilization, elimination of SI thoughts, development of comprehensive mental wellness plan.   Patient Goals: "I just want to get clean and stay sober"  Discharge Plan or Barriers:  Pt accepted to [facility]; however her probation officer needs more time to get her approved to leave the county. Pt requesting discharge--follow-up appts made with her current providers Dr. Casimiro Needle and Wells Guiles (therapist) at [facility].   Reason for Continuation of Hospitalization: none  Estimated Length of Stay: Monday, 05/02/17  Attendees: Patient:  05/02/2017 9:27 AM  Physician: Dr. Nancy Fetter MD; Dr. Mallie Darting MD 05/02/2017 9:27 AM  Nursing: Opal Sidles RN; [facility] RN 05/02/2017 9:27 AM  RN Care Manager: Rhunette Croft 05/02/2017 9:27 AM  Social Worker:  Maxie Better, LCSW 05/02/2017 9:27 AM  Recreational Therapist: Rhunette Croft 05/02/2017 9:27 AM  Other: Ricky Ala NP; Darnelle Maffucci Money NP 05/02/2017 9:27 AM  Other: x 05/02/2017 9:27 AM  Other:X 05/02/2017 9:27 AM    Scribe for Treatment Team: [place], LCSW 05/02/2017 9:27 AM

## 2017-05-04 ENCOUNTER — Encounter: Payer: Self-pay | Admitting: Psychiatry

## 2017-05-04 ENCOUNTER — Other Ambulatory Visit: Payer: Self-pay

## 2017-05-04 ENCOUNTER — Ambulatory Visit: Payer: BC Managed Care – PPO | Admitting: Psychiatry

## 2017-05-04 VITALS — BP 106/72 | HR 62 | Temp 97.4°F | Wt 146.4 lb

## 2017-05-04 DIAGNOSIS — F4312 Post-traumatic stress disorder, chronic: Secondary | ICD-10-CM | POA: Diagnosis not present

## 2017-05-04 DIAGNOSIS — F3162 Bipolar disorder, current episode mixed, moderate: Secondary | ICD-10-CM

## 2017-05-04 DIAGNOSIS — F1420 Cocaine dependence, uncomplicated: Secondary | ICD-10-CM

## 2017-05-04 DIAGNOSIS — F410 Panic disorder [episodic paroxysmal anxiety] without agoraphobia: Secondary | ICD-10-CM

## 2017-05-04 DIAGNOSIS — F17200 Nicotine dependence, unspecified, uncomplicated: Secondary | ICD-10-CM

## 2017-05-04 NOTE — Patient Instructions (Signed)
Quetiapine tablets What is this medicine? QUETIAPINE (kwe TYE a peen) is an antipsychotic. It is used to treat schizophrenia and bipolar disorder, also known as manic-depression. This medicine may be used for other purposes; ask your health care provider or pharmacist if you have questions. COMMON BRAND NAME(S): Seroquel What should I tell my health care provider before I take this medicine? They need to know if you have any of these conditions: -brain tumor or head injury -breast cancer -cataracts -diabetes -difficulty swallowing -heart disease -kidney disease -liver disease -low blood counts, like low white cell, platelet, or red cell counts -low blood pressure or dizziness when standing up -Parkinson's disease -previous heart attack -seizures -suicidal thoughts, plans, or attempt by you or a family member -thyroid disease -an unusual or allergic reaction to quetiapine, other medicines, foods, dyes, or preservatives -pregnant or trying to get pregnant -breast-feeding How should I use this medicine? Take this medicine by mouth. Swallow it with a drink of water. Follow the directions on the prescription label. If it upsets your stomach you can take it with food. Take your medicine at regular intervals. Do not take it more often than directed. Do not stop taking except on the advice of your doctor or health care professional. A special MedGuide will be given to you by the pharmacist with each prescription and refill. Be sure to read this information carefully each time. Talk to your pediatrician regarding the use of this medicine in children. While this drug may be prescribed for children as young as 10 years for selected conditions, precautions do apply. Patients over age 50 years may have a stronger reaction to this medicine and need smaller doses. Overdosage: If you think you have taken too much of this medicine contact a poison control center or emergency room at once. NOTE: This  medicine is only for you. Do not share this medicine with others. What if I miss a dose? If you miss a dose, take it as soon as you can. If it is almost time for your next dose, take only that dose. Do not take double or extra doses. What may interact with this medicine? Do not take this medicine with any of the following medications: -certain medicines for fungal infections like fluconazole, itraconazole, ketoconazole, posaconazole, voriconazole -cisapride -dofetilide -dronedarone -droperidol -grepafloxacin -halofantrine -phenothiazines like chlorpromazine, mesoridazine, thioridazine -pimozide -sparfloxacin -ziprasidone This medicine may also interact with the following medications: -alcohol -antiviral medicines for HIV or AIDS -certain medicines for blood pressure -certain medicines for depression, anxiety, or psychotic disturbances like haloperidol, lorazepam -certain medicines for diabetes -certain medicines for Parkinson's disease -certain medicines for seizures like carbamazepine, phenobarbital, phenytoin -cimetidine -erythromycin -other medicines that prolong the QT interval (cause an abnormal heart rhythm) -rifampin -steroid medicines like prednisone or cortisone This list may not describe all possible interactions. Give your health care provider a list of all the medicines, herbs, non-prescription drugs, or dietary supplements you use. Also tell them if you smoke, drink alcohol, or use illegal drugs. Some items may interact with your medicine. What should I watch for while using this medicine? Visit your doctor or health care professional for regular checks on your progress. It may be several weeks before you see the full effects of this medicine. Your health care provider may suggest that you have your eyes examined prior to starting this medicine, and every 6 months thereafter. If you have been taking this medicine regularly for some time, do not suddenly stop taking it.  You must gradually  reduce the dose or your symptoms may get worse. Ask your doctor or health care professional for advice. Patients and their families should watch out for worsening depression or thoughts of suicide. Also watch out for sudden or severe changes in feelings such as feeling anxious, agitated, panicky, irritable, hostile, aggressive, impulsive, severely restless, overly excited and hyperactive, or not being able to sleep. If this happens, especially at the beginning of antidepressant treatment or after a change in dose, call your health care professional. Lindsey Davenport may get dizzy or drowsy. Do not drive, use machinery, or do anything that needs mental alertness until you know how this medicine affects you. Do not stand or sit up quickly, especially if you are an older patient. This reduces the risk of dizzy or fainting spells. Alcohol can increase dizziness and drowsiness. Avoid alcoholic drinks. Do not treat yourself for colds, diarrhea or allergies. Ask your doctor or health care professional for advice, some ingredients may increase possible side effects. This medicine can reduce the response of your body to heat or cold. Dress warm in cold weather and stay hydrated in hot weather. If possible, avoid extreme temperatures like saunas, hot tubs, very hot or cold showers, or activities that can cause dehydration such as vigorous exercise. What side effects may I notice from receiving this medicine? Side effects that you should report to your doctor or health care professional as soon as possible: -allergic reactions like skin rash, itching or hives, swelling of the face, lips, or tongue -difficulty swallowing -fast or irregular heartbeat -fever or chills, sore throat -fever with rash, swollen lymph nodes, or swelling of the face -increased hunger or thirst -increased urination -problems with balance, talking, walking -seizures -stiff muscles -suicidal thoughts or other mood  changes -uncontrollable head, mouth, neck, arm, or leg movements -unusually weak or tired Side effects that usually do not require medical attention (report to your doctor or health care professional if they continue or are bothersome): -change in sex drive or performance -constipation -drowsy or dizzy -dry mouth -stomach upset -weight gain This list may not describe all possible side effects. Call your doctor for medical advice about side effects. You may report side effects to FDA at 1-800-FDA-1088. Where should I keep my medicine? Keep out of the reach of children. Store at room temperature between 15 and 30 degrees C (59 and 86 degrees F). Throw away any unused medicine after the expiration date. NOTE: This sheet is a summary. It may not cover all possible information. If you have questions about this medicine, talk to your doctor, pharmacist, or health care provider.  2018 Elsevier/Gold Standard (2014-07-23 13:07:35) Escitalopram tablets What is this medicine? ESCITALOPRAM (es sye TAL oh pram) is used to treat depression and certain types of anxiety. This medicine may be used for other purposes; ask your health care provider or pharmacist if you have questions. COMMON BRAND NAME(S): Lexapro What should I tell my health care provider before I take this medicine? They need to know if you have any of these conditions: -bipolar disorder or a family history of bipolar disorder -diabetes -glaucoma -heart disease -kidney or liver disease -receiving electroconvulsive therapy -seizures (convulsions) -suicidal thoughts, plans, or attempt by you or a family member -an unusual or allergic reaction to escitalopram, the related drug citalopram, other medicines, foods, dyes, or preservatives -pregnant or trying to become pregnant -breast-feeding How should I use this medicine? Take this medicine by mouth with a glass of water. Follow the directions on the prescription label.  You can take it  with or without food. If it upsets your stomach, take it with food. Take your medicine at regular intervals. Do not take it more often than directed. Do not stop taking this medicine suddenly except upon the advice of your doctor. Stopping this medicine too quickly may cause serious side effects or your condition may worsen. A special MedGuide will be given to you by the pharmacist with each prescription and refill. Be sure to read this information carefully each time. Talk to your pediatrician regarding the use of this medicine in children. Special care may be needed. Overdosage: If you think you have taken too much of this medicine contact a poison control center or emergency room at once. NOTE: This medicine is only for you. Do not share this medicine with others. What if I miss a dose? If you miss a dose, take it as soon as you can. If it is almost time for your next dose, take only that dose. Do not take double or extra doses. What may interact with this medicine? Do not take this medicine with any of the following medications: -certain medicines for fungal infections like fluconazole, itraconazole, ketoconazole, posaconazole, voriconazole -cisapride -citalopram -dofetilide -dronedarone -linezolid -MAOIs like Carbex, Eldepryl, Marplan, Nardil, and Parnate -methylene blue (injected into a vein) -pimozide -thioridazine -ziprasidone This medicine may also interact with the following medications: -alcohol -amphetamines -aspirin and aspirin-like medicines -carbamazepine -certain medicines for depression, anxiety, or psychotic disturbances -certain medicines for migraine headache like almotriptan, eletriptan, frovatriptan, naratriptan, rizatriptan, sumatriptan, zolmitriptan -certain medicines for sleep -certain medicines that treat or prevent blood clots like warfarin, enoxaparin, dalteparin -cimetidine -diuretics -fentanyl -furazolidone -isoniazid -lithium -metoprolol -NSAIDs,  medicines for pain and inflammation, like ibuprofen or naproxen -other medicines that prolong the QT interval (cause an abnormal heart rhythm) -procarbazine -rasagiline -supplements like St. John's wort, kava kava, valerian -tramadol -tryptophan This list may not describe all possible interactions. Give your health care provider a list of all the medicines, herbs, non-prescription drugs, or dietary supplements you use. Also tell them if you smoke, drink alcohol, or use illegal drugs. Some items may interact with your medicine. What should I watch for while using this medicine? Tell your doctor if your symptoms do not get better or if they get worse. Visit your doctor or health care professional for regular checks on your progress. Because it may take several weeks to see the full effects of this medicine, it is important to continue your treatment as prescribed by your doctor. Patients and their families should watch out for new or worsening thoughts of suicide or depression. Also watch out for sudden changes in feelings such as feeling anxious, agitated, panicky, irritable, hostile, aggressive, impulsive, severely restless, overly excited and hyperactive, or not being able to sleep. If this happens, especially at the beginning of treatment or after a change in dose, call your health care professional. Lindsey Davenport may get drowsy or dizzy. Do not drive, use machinery, or do anything that needs mental alertness until you know how this medicine affects you. Do not stand or sit up quickly, especially if you are an older patient. This reduces the risk of dizzy or fainting spells. Alcohol may interfere with the effect of this medicine. Avoid alcoholic drinks. Your mouth may get dry. Chewing sugarless gum or sucking hard candy, and drinking plenty of water may help. Contact your doctor if the problem does not go away or is severe. What side effects may I notice from receiving this medicine?  Side effects that you  should report to your doctor or health care professional as soon as possible: -allergic reactions like skin rash, itching or hives, swelling of the face, lips, or tongue -anxious -black, tarry stools -changes in vision -confusion -elevated mood, decreased need for sleep, racing thoughts, impulsive behavior -eye pain -fast, irregular heartbeat -feeling faint or lightheaded, falls -feeling agitated, angry, or irritable -hallucination, loss of contact with reality -loss of balance or coordination -loss of memory -painful or prolonged erections -restlessness, pacing, inability to keep still -seizures -stiff muscles -suicidal thoughts or other mood changes -trouble sleeping -unusual bleeding or bruising -unusually weak or tired -vomiting Side effects that usually do not require medical attention (report to your doctor or health care professional if they continue or are bothersome): -changes in appetite -change in sex drive or performance -headache -increased sweating -indigestion, nausea -tremors This list may not describe all possible side effects. Call your doctor for medical advice about side effects. You may report side effects to FDA at 1-800-FDA-1088. Where should I keep my medicine? Keep out of reach of children. Store at room temperature between 15 and 30 degrees C (59 and 86 degrees F). Throw away any unused medicine after the expiration date. NOTE: This sheet is a summary. It may not cover all possible information. If you have questions about this medicine, talk to your doctor, pharmacist, or health care provider.  2018 Elsevier/Gold Standard (2015-06-23 13:20:23)
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
## 2017-05-05 ENCOUNTER — Telehealth: Payer: Self-pay

## 2017-05-05 NOTE — Telephone Encounter (Signed)
It was discussed with patient to return to clinic in 1 week to discuss medications . Will recommend her to wait until she sees me next week ( this was discussed with her yesterday) thanks

## 2017-05-05 NOTE — Telephone Encounter (Signed)
received a request for 90 day supply for the gabapentin 100mg  and for the quetiapine 25mg  . pt was seen on 05-04-17 and has a follow up on  05-12-17.  I want to confirmed that the patient is not taking the gabapentin anymore and that the quetiapine is 50mg  and not 25mg 

## 2017-05-06 NOTE — Telephone Encounter (Signed)
Lindsey Davenport , she was just released from hospital and has supplies . I had discussed to wait for med changes until she returns in one week.

## 2017-05-06 NOTE — Telephone Encounter (Signed)
Received a request from pharmacy for a 90 day supply of the quetiapine fumarate 25mg .    QUEtiapine (SEROQUEL) 50 MG tablet  Medication  Date: 05/02/2017 Department: [facility] Ordering/Authorizing: Derrill Center, NP  Order Providers   Prescribing Provider Encounter Provider  Derrill Center, NP None  Outpatient Medication Detail    Disp Refills Start End   QUEtiapine (SEROQUEL) 50 MG tablet 90 tablet 0 05/02/2017    Sig - Route: Take 3 tablets (150 mg total) by mouth at bedtime. - Oral   Class: Print   Pharmacy   [id number] - [place], [place]

## 2017-05-09 NOTE — Telephone Encounter (Signed)
Pharmacy notified.

## 2017-05-12 ENCOUNTER — Ambulatory Visit: Payer: BC Managed Care – PPO | Admitting: Psychiatry

## 2017-05-12 ENCOUNTER — Other Ambulatory Visit: Payer: Self-pay | Admitting: Obstetrics

## 2017-05-12 ENCOUNTER — Encounter: Payer: Self-pay | Admitting: Psychiatry

## 2017-05-12 ENCOUNTER — Other Ambulatory Visit: Payer: Self-pay

## 2017-05-12 VITALS — BP 113/71 | HR 55 | Temp 98.7°F | Wt 151.4 lb

## 2017-05-12 DIAGNOSIS — F1420 Cocaine dependence, uncomplicated: Secondary | ICD-10-CM | POA: Diagnosis not present

## 2017-05-12 DIAGNOSIS — F4312 Post-traumatic stress disorder, chronic: Secondary | ICD-10-CM | POA: Diagnosis not present

## 2017-05-12 DIAGNOSIS — G2581 Restless legs syndrome: Secondary | ICD-10-CM

## 2017-05-12 DIAGNOSIS — F3162 Bipolar disorder, current episode mixed, moderate: Secondary | ICD-10-CM

## 2017-05-12 DIAGNOSIS — F17200 Nicotine dependence, unspecified, uncomplicated: Secondary | ICD-10-CM

## 2017-05-12 MED ORDER — ESCITALOPRAM OXALATE 5 MG PO TABS: 5.0000 mg | ORAL_TABLET | Freq: Every day | ORAL | 2 refills | Status: DC

## 2017-05-12 MED ORDER — TOPIRAMATE 200 MG PO TABS: 200.0000 mg | ORAL_TABLET | Freq: Every day | ORAL | 2 refills | Status: DC

## 2017-05-12 MED ORDER — NALTREXONE HCL 50 MG PO TABS: 50.0000 mg | ORAL_TABLET | ORAL | 2 refills | Status: DC

## 2017-05-12 MED ORDER — ROPINIROLE HCL 0.25 MG PO TABS: 0.2500 mg | ORAL_TABLET | Freq: Every day | ORAL | 1 refills | Status: DC

## 2017-05-12 MED ORDER — QUETIAPINE FUMARATE 100 MG PO TABS
150.0000 mg | ORAL_TABLET | Freq: Every day | ORAL | 2 refills | Status: DC
Start: 2017-05-12 — End: 2017-06-03
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
## 2017-05-12 NOTE — Patient Instructions (Signed)
Restless Legs Syndrome Restless legs syndrome is a condition that causes uncomfortable feelings or sensations in the legs, especially while sitting or lying down. The sensations usually cause an overwhelming urge to move the legs. The arms can also sometimes be affected. The condition can range from mild to severe. The symptoms often interfere with a person's ability to sleep. What are the causes? The cause of this condition is not known. What increases the risk? This condition is more likely to develop in:  People who are older than age 89.  Pregnant women. In general, restless legs syndrome is more common in women than in men.  People who have a family history of the condition.  People who have certain medical conditions, such as iron deficiency, kidney disease, Parkinson disease, or nerve damage.  People who take certain medicines, such as medicines for high blood pressure, nausea, colds, allergies, depression, and some heart conditions.  What are the signs or symptoms? The main symptom of this condition is uncomfortable sensations in the legs. These sensations may be:  Described as pulling, tingling, prickling, throbbing, crawling, or burning.  Worse while you are sitting or lying down.  Worse during periods of rest or inactivity.  Worse at night, often interfering with your sleep.  Accompanied by a very strong urge to move your legs.  Temporarily relieved by movement of your legs.  The sensations usually affect both sides of the body. The arms can also be affected, but this is rare. People who have this condition often have tiredness during the day because of their lack of sleep at night. How is this diagnosed? This condition may be diagnosed based on your description of the symptoms. You may also have tests, including blood tests, to check for other conditions that may lead to your symptoms. In some cases, you may be asked to spend some time in a sleep lab so your sleeping  can be monitored. How is this treated? Treatment for this condition is focused on managing the symptoms. Treatment may include:  Self-help and lifestyle changes.  Medicines.  Follow these instructions at home:  Take medicines only as directed by your health care provider.  Try these methods to get temporary relief from the uncomfortable sensations: ? Massage your legs. ? Walk or stretch. ? Take a cold or hot bath.  Practice good sleep habits. For example, go to bed and get up at the same time every day.  Exercise regularly.  Practice ways of relaxing, such as yoga or meditation.  Avoid caffeine and alcohol.  Do not use any tobacco products, including cigarettes, chewing tobacco, or electronic cigarettes. If you need help quitting, ask your health care provider.  Keep all follow-up visits as directed by your health care provider. This is important. Contact a health care provider if: Your symptoms do not improve with treatment, or they get worse. This information is not intended to replace advice given to you by your health care provider. Make sure you discuss any questions you have with your health care provider. Document Released: 01/08/2002 Document Revised: 06/26/2015 Document Reviewed: 01/14/2014 Elsevier Interactive Patient Education  2018 [employer]. Ropinirole tablets What is this medicine? ROPINIROLE (roe PIN i role) is used to treat the symptoms of Parkinson's disease. It helps to improve muscle control and movement difficulties. It is also used for the treatment of Restless Legs Syndrome. This medicine may be used for other purposes; ask your health care provider or pharmacist if you have questions. COMMON BRAND NAME(S):  Requip What should I tell my health care provider before I take this medicine? They need to know if you have any of these conditions: -dizzy or fainting spells -heart disease -high blood pressure -kidney disease -liver disease -low blood  pressure -sleeping problems -an unusual or allergic reaction to ropinirole, other medicines, foods, dyes, or preservatives -pregnant or trying to get pregnant -breast-feeding How should I use this medicine? Take this medicine by mouth with a glass of water. Follow the directions on the prescription label. You can take it with or without food. If it upsets your stomach, take it with food. Take your doses at regular intervals. Do not take your medicine more often than directed. Do not stop taking this medicine except on your doctor's advice. Stopping this medicine too quickly may cause serious side effects. Talk to your pediatrician regarding the use of this medicine in children. Special care may be needed. Overdosage: If you think you have taken too much of this medicine contact a poison control center or emergency room at once. NOTE: This medicine is only for you. Do not share this medicine with others. What if I miss a dose? If you miss a dose, take it as soon as you can. If it is almost time for your next dose, take only that dose. Do not take double or extra doses. What may interact with this medicine? -ciprofloxacin -female hormones, like estrogens and birth control pills -medicines for depression, anxiety, or psychotic disturbances -metoclopramide -mexiletine -norfloxacin -omeprazole This list may not describe all possible interactions. Give your health care provider a list of all the medicines, herbs, non-prescription drugs, or dietary supplements you use. Also tell them if you smoke, drink alcohol, or use illegal drugs. Some items may interact with your medicine. What should I watch for while using this medicine? Visit your doctor or health care professional for regular checks on your progress. It may be several weeks or months before you feel the full effect of this medicine. You may get drowsy or dizzy. Do not drive, use machinery, or do anything that needs mental alertness until you  know how this drug affects you. Do not stand or sit up quickly, especially if you are an older patient. This reduces the risk of dizzy or fainting spells. Alcohol can increase possible dizziness. Avoid alcoholic drinks. If you find that you have sudden feelings of wanting to sleep during normal activities, like cooking, watching television, or while driving or riding in a car, you should contact your health care professional. Your mouth may get dry. Chewing sugarless gum or sucking hard candy, and drinking plenty of water may help. Contact your doctor if the problem does not go away or is severe. There have been reports of increased sexual urges or other strong urges such as gambling while taking some medicines for Parkinson's disease. If you experience any of these urges while taking this medicine, you should report it to your health care provider as soon as possible. You should check your skin often for changes to moles and new growths while taking this medicine. Call your doctor if you notice any of these changes. What side effects may I notice from receiving this medicine? Side effects that you should report to your doctor or health care professional as soon as possible: -allergic reactions like skin rash, itching or hives, swelling of the face, lips, or tongue -changes in vision -chest pain -confusion -falling asleep during normal activities like driving -fast, irregular heartbeat -feeling faint or lightheaded,  falls -hallucination, loss of contact with reality -joint or muscle pain -loss of bladder control -loss of memory -new or increased gambling urges, sexual urges, uncontrolled spending, binge or compulsive eating, or other urges -pain, tingling, numbness in the hands or feet -shortness of breath, troubled breathing, tightness in chest, or wheezing -signs and symptoms of low blood pressure like dizziness; feeling faint or lightheaded, falls; unusually weak or tired -swelling of the  ankles, feet, hands -uncontrollable head, mouth, neck, arm, or leg movements -vomiting Side effects that usually do not require medical attention (report to your doctor or health care professional if they continue or are bothersome): -dizziness -drowsiness -headache -increased sweating -nausea -tremors This list may not describe all possible side effects. Call your doctor for medical advice about side effects. You may report side effects to FDA at 1-800-FDA-1088. Where should I keep my medicine? Keep out of the reach of children. Store at room temperature between 20 and 25 degrees C (68 and 77 degrees F). Protect from light and moisture. Keep container tightly closed. Throw away any unused medicine after the expiration date. NOTE: This sheet is a summary. It may not cover all possible information. If you have questions about this medicine, talk to your doctor, pharmacist, or health care provider.  2018 Elsevier/Gold Standard (2015-07-07 10:58:05)

## 2017-05-16 ENCOUNTER — Ambulatory Visit: Payer: BC Managed Care – PPO | Admitting: Licensed Clinical Social Worker

## 2017-06-03 ENCOUNTER — Other Ambulatory Visit: Payer: Self-pay | Admitting: Psychiatry

## 2017-06-03 DIAGNOSIS — F1420 Cocaine dependence, uncomplicated: Secondary | ICD-10-CM

## 2017-06-03 DIAGNOSIS — F4312 Post-traumatic stress disorder, chronic: Secondary | ICD-10-CM

## 2017-06-03 DIAGNOSIS — F3162 Bipolar disorder, current episode mixed, moderate: Secondary | ICD-10-CM

## 2017-06-09 ENCOUNTER — Ambulatory Visit: Payer: BC Managed Care – PPO | Admitting: Psychiatry

## 2017-08-25 ENCOUNTER — Other Ambulatory Visit: Payer: Self-pay | Admitting: Family Medicine

## 2017-08-25 ENCOUNTER — Ambulatory Visit
Admission: RE | Admit: 2017-08-25 | Discharge: 2017-08-25 | Disposition: A | Discharge status: Home / Self Care | Payer: BC Managed Care – PPO | Source: Ambulatory Visit | Attending: Family Medicine | Admitting: Family Medicine

## 2017-08-25 DIAGNOSIS — M542 Cervicalgia: Secondary | ICD-10-CM

## 2017-08-25 DIAGNOSIS — M5030 Other cervical disc degeneration, unspecified cervical region: Secondary | ICD-10-CM | POA: Diagnosis not present

## 2017-11-07 ENCOUNTER — Emergency Department (HOSPITAL_COMMUNITY)
Admission: EM | Admit: 2017-11-07 | Discharge: 2017-11-07 | Disposition: A | Discharge status: Home / Self Care | Payer: BC Managed Care – PPO | Attending: Emergency Medicine | Admitting: Emergency Medicine

## 2017-11-07 ENCOUNTER — Other Ambulatory Visit: Payer: Self-pay

## 2017-11-07 ENCOUNTER — Encounter (HOSPITAL_COMMUNITY): Payer: Self-pay

## 2017-11-07 DIAGNOSIS — Z79899 Other long term (current) drug therapy: Secondary | ICD-10-CM | POA: Insufficient documentation

## 2017-11-07 DIAGNOSIS — E039 Hypothyroidism, unspecified: Secondary | ICD-10-CM | POA: Diagnosis not present

## 2017-11-07 DIAGNOSIS — R1013 Epigastric pain: Secondary | ICD-10-CM | POA: Insufficient documentation

## 2017-11-07 DIAGNOSIS — F17210 Nicotine dependence, cigarettes, uncomplicated: Secondary | ICD-10-CM | POA: Insufficient documentation

## 2017-11-07 DIAGNOSIS — R05 Cough: Secondary | ICD-10-CM | POA: Insufficient documentation

## 2017-11-07 DIAGNOSIS — R053 Chronic cough: Secondary | ICD-10-CM

## 2017-11-07 DIAGNOSIS — R1011 Right upper quadrant pain: Secondary | ICD-10-CM | POA: Diagnosis present

## 2017-11-07 LAB — URINALYSIS, ROUTINE W REFLEX MICROSCOPIC
BILIRUBIN URINE: NEGATIVE
GLUCOSE, UA: NEGATIVE mg/dL
Hgb urine dipstick: NEGATIVE
KETONES UR: NEGATIVE mg/dL
Leukocytes, UA: NEGATIVE
NITRITE: NEGATIVE
Protein, ur: NEGATIVE mg/dL
Specific Gravity, Urine: 1.005 (ref 1.005–1.030)
pH: 8 (ref 5.0–8.0)

## 2017-11-07 LAB — COMPREHENSIVE METABOLIC PANEL
ALBUMIN: 3.5 g/dL (ref 3.5–5.0)
ALK PHOS: 68 U/L (ref 38–126)
ALT: 21 U/L (ref 0–44)
AST: 21 U/L (ref 15–41)
Anion gap: 6 (ref 5–15)
BILIRUBIN TOTAL: 0.4 mg/dL (ref 0.3–1.2)
BUN: 9 mg/dL (ref 6–20)
CO2: 26 mmol/L (ref 22–32)
CREATININE: 0.79 mg/dL (ref 0.44–1.00)
Calcium: 9 mg/dL (ref 8.9–10.3)
Chloride: 110 mmol/L (ref 98–111)
GFR calc Af Amer: >60 mL/min (ref >60)
GFR calc non Af Amer: >60 mL/min (ref >60)
GLUCOSE: 125 mg/dL — AB (ref 70–99)
Potassium: 3.6 mmol/L (ref 3.5–5.1)
Sodium: 142 mmol/L (ref 135–145)
TOTAL PROTEIN: 6 g/dL — AB (ref 6.5–8.1)

## 2017-11-07 LAB — CBC
HCT: 41.4 % (ref 36.0–46.0)
Hemoglobin: 13.5 g/dL (ref 12.0–15.0)
MCH: 30.3 pg (ref 26.0–34.0)
MCHC: 32.6 g/dL (ref 30.0–36.0)
MCV: 92.8 fL (ref 78.0–100.0)
PLATELETS: 208 10*3/uL (ref 150–400)
RBC: 4.46 MIL/uL (ref 3.87–5.11)
RDW: 13 % (ref 11.5–15.5)
WBC: 6.2 10*3/uL (ref 4.0–10.5)

## 2017-11-07 LAB — I-STAT BETA HCG BLOOD, ED (MC, WL, AP ONLY): I-stat hCG, quantitative: <5 m[IU]/mL (ref <5)

## 2017-11-07 LAB — LIPASE, BLOOD: Lipase: 48 U/L (ref 11–51)

## 2017-11-07 MED ORDER — ONDANSETRON 4 MG PO TBDP
4.0000 mg | ORAL_TABLET | Freq: Once | ORAL | Status: AC
  Administered 2017-11-07: 4 mg via ORAL
  Filled 2017-11-07: qty 1

## 2017-11-07 MED ORDER — GI COCKTAIL ~~LOC~~
30.0000 mL | Freq: Once | ORAL | Status: AC
  Administered 2017-11-07: 30 mL via ORAL
  Filled 2017-11-07: qty 30

## 2017-11-07 NOTE — Discharge Instructions (Signed)
Try zantac or pepcid twice a day.  Try to avoid things that may make this worse, most commonly these are spicy foods tomato based products fatty foods chocolate and peppermint.  Alcohol and tobacco can also make this worse.  Return to the emergency department for sudden worsening pain fever or inability to eat or drink. ° °

## 2017-11-07 NOTE — ED Triage Notes (Signed)
Pt endorses abd pain with n/v since last night. VSS.
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
## 2017-11-08 ENCOUNTER — Encounter (HOSPITAL_COMMUNITY): Payer: Self-pay

## 2017-11-08 ENCOUNTER — Emergency Department (HOSPITAL_COMMUNITY)
Admission: EM | Admit: 2017-11-08 | Discharge: 2017-11-08 | Disposition: A | Discharge status: Home / Self Care | Payer: BC Managed Care – PPO | Attending: Emergency Medicine | Admitting: Emergency Medicine

## 2017-11-08 ENCOUNTER — Emergency Department (HOSPITAL_COMMUNITY): Payer: BC Managed Care – PPO

## 2017-11-08 ENCOUNTER — Other Ambulatory Visit: Payer: Self-pay

## 2017-11-08 DIAGNOSIS — Z79899 Other long term (current) drug therapy: Secondary | ICD-10-CM | POA: Diagnosis not present

## 2017-11-08 DIAGNOSIS — E039 Hypothyroidism, unspecified: Secondary | ICD-10-CM | POA: Diagnosis not present

## 2017-11-08 DIAGNOSIS — R569 Unspecified convulsions: Secondary | ICD-10-CM | POA: Diagnosis present

## 2017-11-08 DIAGNOSIS — G4089 Other seizures: Secondary | ICD-10-CM | POA: Insufficient documentation

## 2017-11-08 DIAGNOSIS — F17210 Nicotine dependence, cigarettes, uncomplicated: Secondary | ICD-10-CM | POA: Insufficient documentation

## 2017-11-08 DIAGNOSIS — G40909 Epilepsy, unspecified, not intractable, without status epilepticus: Secondary | ICD-10-CM

## 2017-11-08 LAB — RAPID URINE DRUG SCREEN, HOSP PERFORMED
AMPHETAMINES: NOT DETECTED
BARBITURATES: NOT DETECTED
Benzodiazepines: NOT DETECTED
Cocaine: NOT DETECTED
OPIATES: NOT DETECTED
TETRAHYDROCANNABINOL: NOT DETECTED

## 2017-11-08 NOTE — Discharge Instructions (Addendum)
Call [facility] or [facility] allergy office to schedule the next available appointment

## 2017-11-08 NOTE — ED Notes (Signed)
Pt ambulatory to bathroom

## 2017-11-08 NOTE — ED Notes (Signed)
Pt cussing in the hallway stating all we have done for her is take a blood pressure.

## 2017-11-08 NOTE — ED Notes (Signed)
Patient transported to CT 
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
## 2017-11-08 NOTE — ED Notes (Signed)
No signature pad available. Pt ambulatory and with no s/sx distress

## 2017-11-08 NOTE — ED Triage Notes (Signed)
Per [facility], pt from home with hx of focal seizures, had one last night and another one 2 hours ago, conscious throughout seizure, Had twitching in her left hand and left shoulder. Axox4. No seizure activity at this time.

## 2017-11-09 ENCOUNTER — Encounter: Payer: Self-pay | Admitting: Neurology

## 2017-11-18 DIAGNOSIS — F1911 Other psychoactive substance abuse, in remission: Secondary | ICD-10-CM | POA: Insufficient documentation

## 2017-11-21 ENCOUNTER — Ambulatory Visit: Payer: BC Managed Care – PPO | Admitting: Internal Medicine

## 2017-12-12 ENCOUNTER — Ambulatory Visit (INDEPENDENT_AMBULATORY_CARE_PROVIDER_SITE_OTHER): Payer: BC Managed Care – PPO | Admitting: Internal Medicine

## 2017-12-12 ENCOUNTER — Encounter: Payer: Self-pay | Admitting: Internal Medicine

## 2017-12-12 VITALS — BP 122/74 | HR 60 | Temp 98.3°F | Wt 155.0 lb

## 2017-12-12 DIAGNOSIS — F1911 Other psychoactive substance abuse, in remission: Secondary | ICD-10-CM

## 2017-12-12 DIAGNOSIS — R7301 Impaired fasting glucose: Secondary | ICD-10-CM | POA: Diagnosis not present

## 2017-12-12 DIAGNOSIS — N644 Mastodynia: Secondary | ICD-10-CM | POA: Diagnosis not present

## 2017-12-12 DIAGNOSIS — R5383 Other fatigue: Secondary | ICD-10-CM

## 2017-12-12 DIAGNOSIS — R350 Frequency of micturition: Secondary | ICD-10-CM

## 2017-12-12 DIAGNOSIS — M791 Myalgia, unspecified site: Secondary | ICD-10-CM | POA: Diagnosis not present

## 2017-12-12 DIAGNOSIS — E039 Hypothyroidism, unspecified: Secondary | ICD-10-CM

## 2017-12-12 DIAGNOSIS — M2550 Pain in unspecified joint: Secondary | ICD-10-CM | POA: Diagnosis not present

## 2017-12-12 DIAGNOSIS — R3915 Urgency of urination: Secondary | ICD-10-CM

## 2017-12-12 DIAGNOSIS — F4310 Post-traumatic stress disorder, unspecified: Secondary | ICD-10-CM

## 2017-12-12 DIAGNOSIS — R1013 Epigastric pain: Secondary | ICD-10-CM

## 2017-12-12 DIAGNOSIS — F332 Major depressive disorder, recurrent severe without psychotic features: Secondary | ICD-10-CM

## 2017-12-12 LAB — CBC
HEMATOCRIT: 43.7 % (ref 36.0–46.0)
Hemoglobin: 14.9 g/dL (ref 12.0–15.0)
MCHC: 34.1 g/dL (ref 30.0–36.0)
MCV: 91.5 fl (ref 78.0–100.0)
Platelets: 211 10*3/uL (ref 150.0–400.0)
RBC: 4.78 Mil/uL (ref 3.87–5.11)
RDW: 12.9 % (ref 11.5–15.5)
WBC: 5.5 10*3/uL (ref 4.0–10.5)

## 2017-12-12 LAB — COMPREHENSIVE METABOLIC PANEL
ALT: 15 U/L (ref 0–35)
AST: 15 U/L (ref 0–37)
Albumin: 4.2 g/dL (ref 3.5–5.2)
Alkaline Phosphatase: 79 U/L (ref 39–117)
BUN: 8 mg/dL (ref 6–23)
CALCIUM: 9.4 mg/dL (ref 8.4–10.5)
CO2: 28 meq/L (ref 19–32)
CREATININE: 0.76 mg/dL (ref 0.40–1.20)
Chloride: 109 mEq/L (ref 96–112)
GFR: 90.13 mL/min (ref >60.00)
Glucose, Bld: 110 mg/dL — ABNORMAL HIGH (ref 70–99)
POTASSIUM: 3.5 meq/L (ref 3.5–5.1)
SODIUM: 144 meq/L (ref 135–145)
Total Bilirubin: 0.5 mg/dL (ref 0.2–1.2)
Total Protein: 6.6 g/dL (ref 6.0–8.3)

## 2017-12-12 LAB — TSH: TSH: 0.44 u[IU]/mL (ref 0.35–4.50)

## 2017-12-12 LAB — LUTEINIZING HORMONE: LH: 61.25 m[IU]/mL

## 2017-12-12 LAB — HIGH SENSITIVITY CRP: CRP, High Sensitivity: 1.02 mg/L (ref 0.000–5.000)

## 2017-12-12 LAB — VITAMIN D 25 HYDROXY (VIT D DEFICIENCY, FRACTURES): VITD: 23.85 ng/mL — AB (ref 30.00–100.00)

## 2017-12-12 LAB — T4, FREE: FREE T4: 0.96 ng/dL (ref 0.60–1.60)

## 2017-12-12 LAB — FOLLICLE STIMULATING HORMONE: FSH: 109.4 m[IU]/mL

## 2017-12-12 LAB — SEDIMENTATION RATE: Sed Rate: 5 mm/hr (ref 0–20)

## 2017-12-12 LAB — VITAMIN B12: Vitamin B-12: 319 pg/mL (ref 211–911)

## 2017-12-12 LAB — HEMOGLOBIN A1C: HEMOGLOBIN A1C: 5.8 % (ref 4.6–6.5)
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
## 2017-12-14 LAB — ANTI-NUCLEAR AB-TITER (ANA TITER)

## 2017-12-14 LAB — T3: T3, Total: 136 ng/dL (ref 76–181)

## 2017-12-14 LAB — RHEUMATOID FACTOR: Rhuematoid fact SerPl-aCnc: <14 IU/mL (ref <14)

## 2017-12-14 LAB — ANA: ANA: POSITIVE — AB

## 2017-12-15 ENCOUNTER — Encounter: Payer: Self-pay | Admitting: Internal Medicine

## 2017-12-15 ENCOUNTER — Other Ambulatory Visit: Payer: Self-pay | Admitting: Internal Medicine

## 2017-12-15 DIAGNOSIS — M791 Myalgia, unspecified site: Secondary | ICD-10-CM

## 2017-12-15 DIAGNOSIS — R768 Other specified abnormal immunological findings in serum: Secondary | ICD-10-CM

## 2017-12-15 DIAGNOSIS — M2550 Pain in unspecified joint: Secondary | ICD-10-CM

## 2017-12-20 NOTE — Assessment & Plan Note (Signed)
Follows with psych Stable off meds Will monitor

## 2017-12-20 NOTE — Assessment & Plan Note (Signed)
TSH, Free T4 and T3 today Continue Synthroid as prescribed, will adjust if needed based on labs

## 2017-12-20 NOTE — Assessment & Plan Note (Signed)
No active use in 6 months

## 2017-12-20 NOTE — Assessment & Plan Note (Signed)
Follows with psych Managed off meds Will monitor

## 2017-12-20 NOTE — Patient Instructions (Signed)

## 2017-12-27 ENCOUNTER — Ambulatory Visit: Payer: Medicare Other | Admitting: Neurology

## 2017-12-27 ENCOUNTER — Encounter: Payer: Self-pay | Admitting: Internal Medicine

## 2018-01-16 ENCOUNTER — Ambulatory Visit: Payer: BC Managed Care – PPO | Admitting: Internal Medicine

## 2018-01-16 ENCOUNTER — Encounter: Payer: Self-pay | Admitting: Internal Medicine

## 2018-01-16 VITALS — BP 120/80 | HR 70 | Temp 98.0°F | Wt 154.0 lb

## 2018-01-16 DIAGNOSIS — H9202 Otalgia, left ear: Secondary | ICD-10-CM | POA: Diagnosis not present

## 2018-01-16 DIAGNOSIS — R509 Fever, unspecified: Secondary | ICD-10-CM | POA: Diagnosis not present

## 2018-01-16 DIAGNOSIS — J00 Acute nasopharyngitis [common cold]: Secondary | ICD-10-CM | POA: Diagnosis not present

## 2018-01-16 DIAGNOSIS — R059 Cough, unspecified: Secondary | ICD-10-CM

## 2018-01-16 DIAGNOSIS — R05 Cough: Secondary | ICD-10-CM

## 2018-01-16 LAB — POC INFLUENZA A&B (BINAX/QUICKVUE)
INFLUENZA B, POC: NEGATIVE
Influenza A, POC: NEGATIVE

## 2018-01-16 MED ORDER — HYDROCODONE-HOMATROPINE 5-1.5 MG/5ML PO SYRP: 5.0000 mL | ORAL_SOLUTION | Freq: Three times a day (TID) | ORAL | 0 refills | Status: DC | PRN

## 2018-01-16 MED ORDER — AZITHROMYCIN 250 MG PO TABS: ORAL_TABLET | ORAL | 0 refills | Status: DC

## 2018-01-16 NOTE — Patient Instructions (Signed)
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
## 2018-01-16 NOTE — Addendum Note (Signed)
Addended by: Lurlean Nanny on: 01/16/2018 04:31 PM   Modules accepted: Orders

## 2018-01-17 ENCOUNTER — Ambulatory Visit: Payer: Self-pay | Admitting: Neurology

## 2018-01-17 ENCOUNTER — Encounter

## 2018-01-19 ENCOUNTER — Other Ambulatory Visit
Admission: RE | Admit: 2018-01-19 | Discharge: 2018-01-19 | Disposition: A | Discharge status: Home / Self Care | Payer: BC Managed Care – PPO | Source: Ambulatory Visit | Attending: Student | Admitting: Student

## 2018-01-19 DIAGNOSIS — R197 Diarrhea, unspecified: Secondary | ICD-10-CM | POA: Insufficient documentation

## 2018-01-19 LAB — GASTROINTESTINAL PANEL BY PCR, STOOL (REPLACES STOOL CULTURE)

## 2018-01-19 LAB — C DIFFICILE QUICK SCREEN W PCR REFLEX
C Diff antigen: NEGATIVE
C Diff interpretation: NOT DETECTED
C Diff toxin: NEGATIVE

## 2018-01-23 LAB — CALPROTECTIN, FECAL: Calprotectin, Fecal: <16 ug/g (ref 0–120)

## 2018-01-26 ENCOUNTER — Encounter: Payer: Self-pay | Admitting: Internal Medicine

## 2018-01-26 ENCOUNTER — Encounter: Payer: Self-pay | Admitting: Emergency Medicine

## 2018-01-26 DIAGNOSIS — Y998 Other external cause status: Secondary | ICD-10-CM | POA: Insufficient documentation

## 2018-01-26 DIAGNOSIS — F17210 Nicotine dependence, cigarettes, uncomplicated: Secondary | ICD-10-CM | POA: Insufficient documentation

## 2018-01-26 DIAGNOSIS — Y9389 Activity, other specified: Secondary | ICD-10-CM | POA: Diagnosis not present

## 2018-01-26 DIAGNOSIS — S299XXA Unspecified injury of thorax, initial encounter: Secondary | ICD-10-CM | POA: Diagnosis present

## 2018-01-26 DIAGNOSIS — W01198A Fall on same level from slipping, tripping and stumbling with subsequent striking against other object, initial encounter: Secondary | ICD-10-CM | POA: Diagnosis not present

## 2018-01-26 DIAGNOSIS — S20212A Contusion of left front wall of thorax, initial encounter: Secondary | ICD-10-CM | POA: Insufficient documentation

## 2018-01-26 DIAGNOSIS — Y92007 Garden or yard of unspecified non-institutional (private) residence as the place of occurrence of the external cause: Secondary | ICD-10-CM | POA: Diagnosis not present

## 2018-01-26 NOTE — ED Triage Notes (Signed)
Patient ambulatory to triage with steady gait, without difficulty, tearful; pt reports was outside playing, cutting down trees, fell onto a tree stump injuring left lateral ribcage; c/o pain to chest/back

## 2018-01-27 ENCOUNTER — Emergency Department: Payer: BC Managed Care – PPO

## 2018-01-27 ENCOUNTER — Emergency Department
Admission: EM | Admit: 2018-01-27 | Discharge: 2018-01-27 | Disposition: A | Discharge status: Home / Self Care | Payer: BC Managed Care – PPO | Attending: Emergency Medicine | Admitting: Emergency Medicine

## 2018-01-27 DIAGNOSIS — S20212A Contusion of left front wall of thorax, initial encounter: Secondary | ICD-10-CM

## 2018-01-27 LAB — PANCREATIC ELASTASE, FECAL: PANCREATIC ELASTASE-1, STL: 204 ug Elast./g (ref >200)

## 2018-01-27 MED ORDER — KETOROLAC TROMETHAMINE 60 MG/2ML IM SOLN
60.0000 mg | Freq: Once | INTRAMUSCULAR | Status: AC
  Administered 2018-01-27: 60 mg via INTRAMUSCULAR
  Filled 2018-01-27: qty 2

## 2018-01-27 MED ORDER — KETOROLAC TROMETHAMINE 10 MG PO TABS: 10.0000 mg | ORAL_TABLET | Freq: Four times a day (QID) | ORAL | 0 refills | Status: DC | PRN

## 2018-01-27 MED ORDER — LEVOTHYROXINE SODIUM 50 MCG PO TABS: 50.0000 ug | ORAL_TABLET | Freq: Every day | ORAL | 5 refills | Status: DC

## 2018-01-27 MED ORDER — LIDOCAINE 5 % EX PTCH
1.0000 | MEDICATED_PATCH | CUTANEOUS | Status: DC
  Administered 2018-01-27: 1 via TRANSDERMAL
  Filled 2018-01-27: qty 1

## 2018-01-27 MED ORDER — LIDOCAINE 5 % EX PTCH: 1.0000 | MEDICATED_PATCH | CUTANEOUS | 0 refills | Status: DC
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
## 2018-02-03 ENCOUNTER — Inpatient Hospital Stay: Payer: Self-pay | Admitting: Internal Medicine

## 2018-02-07 ENCOUNTER — Inpatient Hospital Stay: Payer: Self-pay | Admitting: Internal Medicine

## 2018-02-09 ENCOUNTER — Encounter: Payer: Self-pay | Admitting: Family Medicine

## 2018-02-09 ENCOUNTER — Ambulatory Visit: Payer: Self-pay | Admitting: Internal Medicine

## 2018-02-09 ENCOUNTER — Ambulatory Visit (INDEPENDENT_AMBULATORY_CARE_PROVIDER_SITE_OTHER)
Admission: RE | Admit: 2018-02-09 | Discharge: 2018-02-09 | Disposition: A | Discharge status: Home / Self Care | Payer: BC Managed Care – PPO | Source: Ambulatory Visit | Attending: Family Medicine | Admitting: Family Medicine

## 2018-02-09 ENCOUNTER — Ambulatory Visit: Payer: BC Managed Care – PPO | Admitting: Family Medicine

## 2018-02-09 VITALS — BP 124/72 | HR 61 | Temp 97.6°F | Ht 63.0 in | Wt 159.8 lb

## 2018-02-09 DIAGNOSIS — M546 Pain in thoracic spine: Secondary | ICD-10-CM | POA: Insufficient documentation

## 2018-02-09 DIAGNOSIS — R0781 Pleurodynia: Secondary | ICD-10-CM

## 2018-02-09 DIAGNOSIS — F1911 Other psychoactive substance abuse, in remission: Secondary | ICD-10-CM

## 2018-02-09 MED ORDER — GABAPENTIN 300 MG PO CAPS: 300.0000 mg | ORAL_CAPSULE | Freq: Three times a day (TID) | ORAL | 3 refills | Status: DC | PRN

## 2018-02-09 NOTE — Assessment & Plan Note (Signed)
No recent use  Cannot give pt narcotic pain medication for her rib fx due to her hx of drug abuse

## 2018-02-09 NOTE — Assessment & Plan Note (Signed)
S/p fall backwards with rib fractures  No compression fx on xray  Limited opt for pain control  Will try gabapentin  Heat/ gentle stretching Unable to do PT due to rib fractures

## 2018-02-09 NOTE — Patient Instructions (Addendum)
Let's check an xray of ribs and back  Grab a pillow and take deep breaths once an hour to prevent pneumonia  Think about nicotine gum or inhaler to help you quit smoking   Let's try gabapentin for pain   Use warm or cool compresses -whatever feels better   Try sleeping propped up or in a recliner/chair   Update if not starting to improve in a week or if worsening

## 2018-02-09 NOTE — Assessment & Plan Note (Signed)
Xray shows rib fx 5,6,7 on L -mildly displaced  Disc risk of pneumothorax (none seen on xr) and symptoms to watch for (sudden sob/more pain)  Enc gentle heat/ice  Sit up for sleep if needed Deep breaths once per hour with pillow to prevent atelectasis  Gabapentin for pain prn (options very limited for pain control due to prior drug abuse) Disc poss side eff incl sedation/dizziness  Update if no further imp in a week or if worse  Symptoms will likely take a while to subside
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
## 2018-02-10 ENCOUNTER — Encounter: Payer: Self-pay | Admitting: Family Medicine

## 2018-02-14 ENCOUNTER — Encounter: Payer: Self-pay | Admitting: Internal Medicine

## 2018-02-15 ENCOUNTER — Encounter: Admission: RE | Payer: Self-pay | Source: Home / Self Care

## 2018-02-15 ENCOUNTER — Ambulatory Visit
Admission: RE | Admit: 2018-02-15 | Payer: BC Managed Care – PPO | Source: Home / Self Care | Admitting: Internal Medicine

## 2018-02-15 SURGERY — EGD (ESOPHAGOGASTRODUODENOSCOPY)
Anesthesia: General

## 2018-02-16 ENCOUNTER — Encounter: Payer: Self-pay | Admitting: Family Medicine

## 2018-02-21 ENCOUNTER — Ambulatory Visit: Payer: BC Managed Care – PPO | Admitting: Internal Medicine

## 2018-02-21 ENCOUNTER — Encounter: Payer: Self-pay | Admitting: Internal Medicine

## 2018-02-21 VITALS — BP 120/76 | HR 63 | Temp 98.0°F | Wt 157.0 lb

## 2018-02-21 DIAGNOSIS — G8929 Other chronic pain: Secondary | ICD-10-CM | POA: Diagnosis not present

## 2018-02-21 DIAGNOSIS — R200 Anesthesia of skin: Secondary | ICD-10-CM

## 2018-02-21 DIAGNOSIS — M542 Cervicalgia: Secondary | ICD-10-CM

## 2018-02-21 MED ORDER — PREDNISONE 10 MG PO TABS: ORAL_TABLET | ORAL | 0 refills | Status: DC

## 2018-02-21 NOTE — Patient Instructions (Signed)
Cervical Radiculopathy    Cervical radiculopathy means that a nerve in the neck is pinched or bruised. This can cause pain or loss of feeling (numbness) that runs from your neck to your arm and fingers.  Follow these instructions at home:  Managing pain  · Take over-the-counter and prescription medicines only as told by your doctor.  · If directed, put ice on the injured or painful area.  ? Put ice in a plastic bag.  ? Place a towel between your skin and the bag.  ? Leave the ice on for 20 minutes, 2-3 times per day.  · If ice does not help, you can try using heat. Take a warm shower or warm bath, or use a heat pack as told by your doctor.  · You may try a gentle neck and shoulder massage.  Activity  · Rest as needed. Follow instructions from your doctor about any activities to avoid.  · Do exercises as told by your doctor or physical therapist.  General instructions  · If you were given a soft collar, wear it as told by your doctor.  · Use a flat pillow when you sleep.  · Keep all follow-up visits as told by your doctor. This is important.  Contact a doctor if:  · Your condition does not improve with treatment.  Get help right away if:  · Your pain gets worse and is not controlled with medicine.  · You lose feeling or feel weak in your hand, arm, face, or leg.  · You have a fever.  · You have a stiff neck.  · You cannot control when you poop or pee (have incontinence).  · You have trouble with walking, balance, or talking.  This information is not intended to replace advice given to you by your health care provider. Make sure you discuss any questions you have with your health care provider.  Document Released: 01/07/2011 Document Revised: 06/26/2015 Document Reviewed: 03/14/2014  Elsevier Interactive Patient Education © 2019 [employer]
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
## 2018-02-28 ENCOUNTER — Encounter: Payer: Self-pay | Admitting: Internal Medicine

## 2018-02-28 DIAGNOSIS — G8929 Other chronic pain: Secondary | ICD-10-CM

## 2018-02-28 DIAGNOSIS — R200 Anesthesia of skin: Secondary | ICD-10-CM

## 2018-02-28 DIAGNOSIS — M542 Cervicalgia: Principal | ICD-10-CM

## 2018-03-03 ENCOUNTER — Other Ambulatory Visit: Payer: Self-pay | Admitting: Family Medicine

## 2018-03-03 NOTE — Telephone Encounter (Signed)
Refill request for 90 day supply. RX sent in on 02/09/2018 for 30 day supply-looks like this is a new medication for the patient. Please review.

## 2018-03-09 ENCOUNTER — Encounter: Payer: Self-pay | Admitting: Internal Medicine

## 2018-03-09 ENCOUNTER — Ambulatory Visit
Admission: RE | Admit: 2018-03-09 | Discharge: 2018-03-09 | Disposition: A | Discharge status: Home / Self Care | Payer: BC Managed Care – PPO | Source: Ambulatory Visit | Attending: Internal Medicine | Admitting: Internal Medicine

## 2018-03-09 ENCOUNTER — Other Ambulatory Visit: Payer: Self-pay | Admitting: Internal Medicine

## 2018-03-09 DIAGNOSIS — M5412 Radiculopathy, cervical region: Secondary | ICD-10-CM

## 2018-03-09 DIAGNOSIS — G8929 Other chronic pain: Secondary | ICD-10-CM

## 2018-03-09 DIAGNOSIS — M542 Cervicalgia: Principal | ICD-10-CM

## 2018-03-09 DIAGNOSIS — R200 Anesthesia of skin: Secondary | ICD-10-CM

## 2018-03-21 ENCOUNTER — Encounter: Payer: Self-pay | Admitting: Internal Medicine

## 2018-03-21 ENCOUNTER — Ambulatory Visit: Payer: BC Managed Care – PPO | Admitting: Internal Medicine

## 2018-03-21 ENCOUNTER — Telehealth: Payer: Self-pay

## 2018-03-21 VITALS — BP 118/74 | HR 73 | Temp 98.1°F | Wt 163.0 lb

## 2018-03-21 DIAGNOSIS — J069 Acute upper respiratory infection, unspecified: Secondary | ICD-10-CM | POA: Diagnosis not present

## 2018-03-21 DIAGNOSIS — R509 Fever, unspecified: Secondary | ICD-10-CM | POA: Diagnosis not present

## 2018-03-21 DIAGNOSIS — B9789 Other viral agents as the cause of diseases classified elsewhere: Secondary | ICD-10-CM

## 2018-03-21 MED ORDER — HYDROCODONE-HOMATROPINE 5-1.5 MG/5ML PO SYRP: 5.0000 mL | ORAL_SOLUTION | Freq: Three times a day (TID) | ORAL | 0 refills | Status: DC | PRN

## 2018-03-21 NOTE — Telephone Encounter (Signed)
The Naltrxone is scheduled take once a day med. Avie Echevaria NP said to not fill the Hycodan and tell pt to take OTC Delsym. Vicente Males voiced understanding and will let pt know.

## 2018-03-21 NOTE — Telephone Encounter (Signed)
Anna pharmacist at [facility] said Naltrexone was given to pt by Lorelle Gibbs and Naltrexone makes Hycodan ineffective.Please advise.

## 2018-03-23 ENCOUNTER — Encounter: Payer: Self-pay | Admitting: Internal Medicine

## 2018-03-23 NOTE — Patient Instructions (Signed)
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
## 2018-04-05 ENCOUNTER — Encounter
Admission: RE | Disposition: A | Discharge status: Home / Self Care | Payer: Self-pay | Source: Home / Self Care | Attending: Unknown Physician Specialty

## 2018-04-05 ENCOUNTER — Ambulatory Visit: Payer: BC Managed Care – PPO | Admitting: Certified Registered Nurse Anesthetist

## 2018-04-05 ENCOUNTER — Ambulatory Visit
Admission: RE | Admit: 2018-04-05 | Discharge: 2018-04-05 | Disposition: A | Discharge status: Home / Self Care | Payer: BC Managed Care – PPO | Attending: Unknown Physician Specialty | Admitting: Unknown Physician Specialty

## 2018-04-05 ENCOUNTER — Encounter: Payer: Self-pay | Admitting: *Deleted

## 2018-04-05 ENCOUNTER — Other Ambulatory Visit: Payer: Self-pay

## 2018-04-05 DIAGNOSIS — F17210 Nicotine dependence, cigarettes, uncomplicated: Secondary | ICD-10-CM | POA: Insufficient documentation

## 2018-04-05 DIAGNOSIS — Z888 Allergy status to other drugs, medicaments and biological substances status: Secondary | ICD-10-CM | POA: Diagnosis not present

## 2018-04-05 DIAGNOSIS — Z818 Family history of other mental and behavioral disorders: Secondary | ICD-10-CM | POA: Insufficient documentation

## 2018-04-05 DIAGNOSIS — K2980 Duodenitis without bleeding: Secondary | ICD-10-CM | POA: Diagnosis not present

## 2018-04-05 DIAGNOSIS — K621 Rectal polyp: Secondary | ICD-10-CM | POA: Diagnosis not present

## 2018-04-05 DIAGNOSIS — G40909 Epilepsy, unspecified, not intractable, without status epilepticus: Secondary | ICD-10-CM | POA: Insufficient documentation

## 2018-04-05 DIAGNOSIS — K635 Polyp of colon: Secondary | ICD-10-CM | POA: Diagnosis present

## 2018-04-05 DIAGNOSIS — E039 Hypothyroidism, unspecified: Secondary | ICD-10-CM | POA: Insufficient documentation

## 2018-04-05 DIAGNOSIS — Z86010 Personal history of colonic polyps: Secondary | ICD-10-CM | POA: Diagnosis not present

## 2018-04-05 DIAGNOSIS — F319 Bipolar disorder, unspecified: Secondary | ICD-10-CM | POA: Diagnosis not present

## 2018-04-05 DIAGNOSIS — Z79899 Other long term (current) drug therapy: Secondary | ICD-10-CM | POA: Diagnosis not present

## 2018-04-05 DIAGNOSIS — R197 Diarrhea, unspecified: Secondary | ICD-10-CM | POA: Insufficient documentation

## 2018-04-05 DIAGNOSIS — K2950 Unspecified chronic gastritis without bleeding: Secondary | ICD-10-CM | POA: Insufficient documentation

## 2018-04-05 DIAGNOSIS — Z79890 Hormone replacement therapy (postmenopausal): Secondary | ICD-10-CM | POA: Diagnosis not present

## 2018-04-05 DIAGNOSIS — K219 Gastro-esophageal reflux disease without esophagitis: Secondary | ICD-10-CM | POA: Diagnosis not present

## 2018-04-05 DIAGNOSIS — R112 Nausea with vomiting, unspecified: Secondary | ICD-10-CM | POA: Diagnosis not present

## 2018-04-05 DIAGNOSIS — K3189 Other diseases of stomach and duodenum: Secondary | ICD-10-CM | POA: Insufficient documentation

## 2018-04-05 HISTORY — DX: Other specified abnormal immunological findings in serum: R76.8

## 2018-04-05 HISTORY — DX: Suicidal ideations: R45.851

## 2018-04-05 HISTORY — PX: COLONOSCOPY WITH PROPOFOL: SHX5780

## 2018-04-05 HISTORY — DX: Nontraumatic intracerebral hemorrhage, unspecified: I61.9

## 2018-04-05 HISTORY — PX: ESOPHAGOGASTRODUODENOSCOPY (EGD) WITH PROPOFOL: SHX5813

## 2018-04-05 LAB — URINE DRUG SCREEN, QUALITATIVE (ARMC ONLY)
Amphetamines, Ur Screen: NOT DETECTED
Barbiturates, Ur Screen: NOT DETECTED
Benzodiazepine, Ur Scrn: POSITIVE — AB
Cannabinoid 50 Ng, Ur ~~LOC~~: NOT DETECTED
Cocaine Metabolite,Ur ~~LOC~~: NOT DETECTED
MDMA (Ecstasy)Ur Screen: NOT DETECTED
Methadone Scn, Ur: NOT DETECTED
OPIATE, UR SCREEN: NOT DETECTED
Phencyclidine (PCP) Ur S: NOT DETECTED
Tricyclic, Ur Screen: NOT DETECTED

## 2018-04-05 SURGERY — COLONOSCOPY WITH PROPOFOL
Anesthesia: General

## 2018-04-05 MED ORDER — SODIUM CHLORIDE 0.9 % IV SOLN
INTRAVENOUS | Status: DC
  Administered 2018-04-05: 11:00:00 via INTRAVENOUS

## 2018-04-05 MED ORDER — PROPOFOL 10 MG/ML IV BOLUS
INTRAVENOUS | Status: DC | PRN
  Administered 2018-04-05: 100 mg via INTRAVENOUS

## 2018-04-05 MED ORDER — MIDAZOLAM HCL 2 MG/2ML IJ SOLN
INTRAMUSCULAR | Status: DC | PRN
  Administered 2018-04-05: 2 mg via INTRAVENOUS

## 2018-04-05 MED ORDER — SODIUM CHLORIDE 0.9 % IV SOLN
INTRAVENOUS | Status: DC
  Administered 2018-04-05: 12:00:00 via INTRAVENOUS

## 2018-04-05 MED ORDER — LIDOCAINE HCL (CARDIAC) PF 100 MG/5ML IV SOSY
PREFILLED_SYRINGE | INTRAVENOUS | Status: DC | PRN
  Administered 2018-04-05: 100 mg via INTRAVENOUS

## 2018-04-05 MED ORDER — PROPOFOL 10 MG/ML IV BOLUS
INTRAVENOUS | Status: AC
  Filled 2018-04-05: qty 60

## 2018-04-05 MED ORDER — EPHEDRINE SULFATE 50 MG/ML IJ SOLN
INTRAMUSCULAR | Status: DC | PRN
  Administered 2018-04-05: 7.5 mg via INTRAVENOUS

## 2018-04-05 MED ORDER — PHENYLEPHRINE HCL 10 MG/ML IJ SOLN
INTRAMUSCULAR | Status: DC | PRN
  Administered 2018-04-05: 100 ug via INTRAVENOUS
  Administered 2018-04-05 (×2): 50 ug via INTRAVENOUS

## 2018-04-05 MED ORDER — MIDAZOLAM HCL 2 MG/2ML IJ SOLN
INTRAMUSCULAR | Status: AC
  Filled 2018-04-05: qty 2

## 2018-04-05 MED ORDER — PROPOFOL 500 MG/50ML IV EMUL
INTRAVENOUS | Status: DC | PRN
  Administered 2018-04-05: 100 ug/kg/min via INTRAVENOUS

## 2018-04-05 MED ORDER — GLYCOPYRROLATE 0.2 MG/ML IJ SOLN
INTRAMUSCULAR | Status: DC | PRN
  Administered 2018-04-05: 0.2 mg via INTRAVENOUS

## 2018-04-05 NOTE — Transfer of Care (Signed)
Immediate Anesthesia Transfer of Care Note  Patient: Lindsey Davenport  Procedure(s) Performed: COLONOSCOPY WITH PROPOFOL (N/A ) ESOPHAGOGASTRODUODENOSCOPY (EGD) WITH PROPOFOL (N/A )  Patient Location: PACU and Endoscopy Unit  Anesthesia Type:General  Level of Consciousness: sedated  Airway & Oxygen Therapy: Patient Spontanous Breathing and Patient connected to nasal cannula oxygen  Post-op Assessment: Report given to RN, Post -op Vital signs reviewed and stable and Patient moving all extremities  Post vital signs: Reviewed and stable  Last Vitals:  Vitals Value Taken Time  BP 99/70 04/05/2018  1:00 PM  Temp    Pulse 67 04/05/2018  1:01 PM  Resp 15 04/05/2018  1:01 PM  SpO2 100 % 04/05/2018  1:01 PM  Vitals shown include unvalidated device data.  Last Pain:  Vitals:   04/05/18 1120  TempSrc: Tympanic  PainSc: 0-No pain         Complications: No apparent anesthesia complications

## 2018-04-05 NOTE — Progress Notes (Signed)
Neg pregnancy test on 04/05/2018

## 2018-04-05 NOTE — Op Note (Signed)
[facility] Gastroenterology Patient Name: Lindsey Davenport Procedure Date: 04/05/2018 11:23 AM MRN: [id number] Account #: [id number] Date of Birth: 23-Oct-1980 Admit Type: Outpatient Age: 38 Room: [facility] ENDO ROOM 3 Gender: Female Note Status: Finalized Procedure:            Upper GI endoscopy Indications:          Nausea with vomiting Providers:            Manya Silvas, MD Medicines:            Propofol per Anesthesia Complications:        No immediate complications. Procedure:            Pre-Anesthesia Assessment:                       - After reviewing the risks and benefits, the patient                        was deemed in satisfactory condition to undergo the                        procedure.                       After obtaining informed consent, the endoscope was                        passed under direct vision. Throughout the procedure,                        the patient's blood pressure, pulse, and oxygen                        saturations were monitored continuously. The Endoscope                        was introduced through the mouth, and advanced to the                        second part of duodenum. The upper GI endoscopy was                        accomplished without difficulty. The patient tolerated                        the procedure well. Findings:      The examined esophagus was normal.      Diffuse mild inflammation characterized by erythema and granularity was       found at the gastroesophageal junction. Biopsies were taken with a cold       forceps for histology. Biopsies were taken with a cold forceps for       Helicobacter pylori testing.      Striped mildly erythematous mucosa without bleeding was found in the       gastric antrum. Biopsies were taken with a cold forceps for histology.       Biopsies were taken with a cold forceps for Helicobacter pylori testing.      Patchy mildly erythematous mucosa without active bleeding and  with no       stigmata of bleeding was found in the duodenal bulb. Biopsies were taken  with a cold forceps for histology. Impression:           - Normal esophagus.                       - Gastritis. Biopsied.                       - Erythematous mucosa in the antrum. Biopsied.                       - Erythematous duodenopathy. Biopsied. Recommendation:       - Await pathology results. Manya Silvas, MD 04/05/2018 12:31:54 PM This report has been signed electronically. Number of Addenda: 0 Note Initiated On: 04/05/2018 11:23 AM      [facility]

## 2018-04-05 NOTE — Op Note (Signed)
[facility] Gastroenterology Patient Name: Lindsey Davenport Procedure Date: 04/05/2018 11:23 AM MRN: [id number] Account #: [id number] Date of Birth: Jul 17, 1980 Admit Type: Outpatient Age: 38 Room: [facility] ENDO ROOM 3 Gender: Female Note Status: Finalized Procedure:            Colonoscopy Indications:          High risk colon cancer surveillance: Personal history                        of colonic polyps Providers:            Manya Silvas, MD Medicines:            Propofol per Anesthesia Complications:        No immediate complications. Procedure:            Pre-Anesthesia Assessment:                       - After reviewing the risks and benefits, the patient                        was deemed in satisfactory condition to undergo the                        procedure.                       After obtaining informed consent, the colonoscope was                        passed under direct vision. Throughout the procedure,                        the patient's blood pressure, pulse, and oxygen                        saturations were monitored continuously. The                        Colonoscope was introduced through the anus and                        advanced to the the cecum, identified by appendiceal                        orifice and ileocecal valve. The colonoscopy was                        performed without difficulty. The patient tolerated the                        procedure well. The quality of the bowel preparation                        was excellent. Findings:      A diminutive polyp was found in the rectum. The polyp was sessile. The       polyp was removed with a cold snare. Resection and retrieval were       complete.      Due to diarrhea I biopsied the colon in the right colon hepatic flexure,       transverse colon, descending  colon, sigmoid colon.      Colon was very cleaned out. Good visualization of the colon lining. Impression:           - One  diminutive polyp in the rectum, removed with a                        cold snare. Resected and retrieved. Recommendation:       - Await pathology results. Manya Silvas, MD 04/05/2018 1:02:16 PM This report has been signed electronically. Number of Addenda: 0 Note Initiated On: 04/05/2018 11:23 AM Scope Withdrawal Time: 0 hours 13 minutes 16 seconds  Total Procedure Duration: 0 hours 18 minutes 32 seconds       [facility]
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
## 2018-04-05 NOTE — Anesthesia Post-op Follow-up Note (Signed)
Anesthesia QCDR form completed.        

## 2018-04-06 ENCOUNTER — Encounter: Payer: Self-pay | Admitting: Unknown Physician Specialty

## 2018-04-06 LAB — SURGICAL PATHOLOGY

## 2018-04-08 NOTE — Anesthesia Postprocedure Evaluation (Signed)
Anesthesia Post Note  Patient: Sylvia Helms Mixon  Procedure(s) Performed: COLONOSCOPY WITH PROPOFOL (N/A ) ESOPHAGOGASTRODUODENOSCOPY (EGD) WITH PROPOFOL (N/A )  Patient location during evaluation: PACU Anesthesia Type: General Level of consciousness: awake and alert Pain management: pain level controlled Vital Signs Assessment: post-procedure vital signs reviewed and stable Respiratory status: spontaneous breathing, nonlabored ventilation, respiratory function stable and patient connected to nasal cannula oxygen Cardiovascular status: blood pressure returned to baseline and stable Postop Assessment: no apparent nausea or vomiting Anesthetic complications: no     Last Vitals:  Vitals:   04/05/18 1324 04/05/18 1330  BP:  116/90  Pulse: 60 (!) 56  Resp: 17 11  Temp:    SpO2: 99% 100%    Last Pain:  Vitals:   04/06/18 0737  TempSrc:   PainSc: 0-No pain                 Molli Barrows

## 2018-05-30 ENCOUNTER — Ambulatory Visit: Payer: Self-pay | Admitting: *Deleted

## 2018-05-30 NOTE — Telephone Encounter (Signed)
Patient is reporting fatigue and joint and muscle soreness that started 3 days ago. Patient reports the front areas looks fine. Area under L arm and on back are very red. One area-R lower back reported looks like bulls eye. Patient report that last night she had soaking sweats. Presently temperature in ear is 99.7. Patient reports she has headache too. Call to office per protocol- patient advised ED or PCP- per office if no fever,cough, respiratory symptoms she can do virtual visit- patient feels so bad- she is going to go to UC/ED for check. Will let PCP know that patient is going to be seen.  Reason for Disposition . [1] 2 to 14 days following tick bite AND [2] severe headache with fever occurs  Answer Assessment - Initial Assessment Questions 1. TYPE of TICK: "Is it a wood tick or a deer tick?" If unsure, ask: "What size was the tick?" "Did it look more like a watermelon seed or a poppy seed?"      Patient had 7 ticks- patient states she removed all within the last week. Patient does not remember- either freckle or white dot- small 2. LOCATION: "Where is the tick bite located?"      Locations- L and R side of back- lower, almost under R arm pit , 3 on ribcage on R,  L ribcage at breast 3. ONSET: "How long do you think the tick was attached before you removed it?" (Hours or days)      Removed over a period of days- patient is not sure how long they were attached- possibly a couple days- patient mowed grass amd did not shoer until next day- ticks removed over 1 week ago 4. TETANUS: "When was the last tetanus booster?"      Not sure- possibly 12 years ago 5. PREGNANCY: "Is there any chance you are pregnant?" "When was your last menstrual period?"     No- LMP- BTL/ablation  Protocols used: TICK BITE-A-AH

## 2018-05-30 NOTE — Telephone Encounter (Addendum)
Opal Sidles RN also noted;Advised UC/ED      This was skype message;   [05/30/2018 2:02 PM]  Valli Glance:   Hi Jonet Mathies- I am triaging a patient who is going to need to be checked for [facility] spotted fever- she had several tick bites and she is having symptoms. [id number] Do you want her sent out or scheduled at office   [05/30/2018 2:05 PM]  Ozzie Hoyle:   if pt has symptoms of fever,cough,SOB or S/T; has traveled outside the state last 14 days or been exposed to positive covid or flu then would need to go to [facility] or ED. if pt is asymptomatic then could schedule virtual visit and then if needs labs could come for labs only if ordered by provider. pt would remain in car for labs. I hope this answers your question.   [05/30/2018 2:06 PM]  Valli Glance:   Having her check temp now   [05/30/2018 2:06 PM]  Ozzie Hoyle:   QZ   [05/30/2018 2:11 PM]  Valli Glance:   99.7 in ear- she has not had exposure- but feels so bad she is going to UC/ED. Thanks for the advise.   We saved this conversation. You'll see it soon in the Conversations tab in [employer] and in the [place] History folder in [place].

## 2018-07-18 ENCOUNTER — Ambulatory Visit (INDEPENDENT_AMBULATORY_CARE_PROVIDER_SITE_OTHER): Payer: BC Managed Care – PPO | Admitting: Internal Medicine

## 2018-07-18 ENCOUNTER — Encounter: Payer: Self-pay | Admitting: Internal Medicine

## 2018-07-18 ENCOUNTER — Other Ambulatory Visit: Payer: Self-pay

## 2018-07-18 VITALS — HR 55 | Temp 98.1°F | Wt 155.0 lb

## 2018-07-18 DIAGNOSIS — N898 Other specified noninflammatory disorders of vagina: Secondary | ICD-10-CM

## 2018-07-18 DIAGNOSIS — F1911 Other psychoactive substance abuse, in remission: Secondary | ICD-10-CM

## 2018-07-18 DIAGNOSIS — N9489 Other specified conditions associated with female genital organs and menstrual cycle: Secondary | ICD-10-CM

## 2018-07-18 DIAGNOSIS — R102 Pelvic and perineal pain: Secondary | ICD-10-CM

## 2018-07-18 DIAGNOSIS — F4310 Post-traumatic stress disorder, unspecified: Secondary | ICD-10-CM | POA: Diagnosis not present

## 2018-07-18 DIAGNOSIS — F3130 Bipolar disorder, current episode depressed, mild or moderate severity, unspecified: Secondary | ICD-10-CM

## 2018-07-18 DIAGNOSIS — Z113 Encounter for screening for infections with a predominantly sexual mode of transmission: Secondary | ICD-10-CM

## 2018-07-18 DIAGNOSIS — F332 Major depressive disorder, recurrent severe without psychotic features: Secondary | ICD-10-CM | POA: Diagnosis not present

## 2018-07-18 NOTE — Patient Instructions (Signed)
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
## 2018-07-19 ENCOUNTER — Other Ambulatory Visit: Payer: Self-pay | Admitting: Internal Medicine

## 2018-07-19 ENCOUNTER — Telehealth: Payer: Self-pay | Admitting: *Deleted

## 2018-07-19 LAB — WET PREP BY MOLECULAR PROBE
Candida species: DETECTED — AB
Gardnerella vaginalis: NOT DETECTED
MICRO NUMBER:: 574572
SPECIMEN QUALITY:: ADEQUATE
Trichomonas vaginosis: NOT DETECTED

## 2018-07-19 LAB — C. TRACHOMATIS/N. GONORRHOEAE RNA
C. trachomatis RNA, TMA: NOT DETECTED
N. gonorrhoeae RNA, TMA: NOT DETECTED

## 2018-07-19 MED ORDER — FLUCONAZOLE 150 MG PO TABS: 150.0000 mg | ORAL_TABLET | Freq: Every day | ORAL | 1 refills | Status: DC
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
## 2018-07-21 NOTE — Telephone Encounter (Signed)
Patient returning call from the office in regards to her lab results   [phone]

## 2018-07-26 NOTE — Telephone Encounter (Signed)
Please sign and close encounter when completed. 

## 2018-07-27 ENCOUNTER — Encounter: Payer: Self-pay | Admitting: Internal Medicine

## 2018-08-08 ENCOUNTER — Ambulatory Visit: Payer: BC Managed Care – PPO | Admitting: Family Medicine

## 2018-08-09 ENCOUNTER — Telehealth: Payer: Self-pay | Admitting: *Deleted

## 2018-08-09 ENCOUNTER — Ambulatory Visit (INDEPENDENT_AMBULATORY_CARE_PROVIDER_SITE_OTHER): Payer: BC Managed Care – PPO | Admitting: Family Medicine

## 2018-08-09 ENCOUNTER — Encounter (INDEPENDENT_AMBULATORY_CARE_PROVIDER_SITE_OTHER): Payer: Self-pay

## 2018-08-09 ENCOUNTER — Encounter: Payer: Self-pay | Admitting: Family Medicine

## 2018-08-09 VITALS — Temp 100.5°F | Ht 63.0 in

## 2018-08-09 DIAGNOSIS — R509 Fever, unspecified: Secondary | ICD-10-CM

## 2018-08-09 DIAGNOSIS — F17200 Nicotine dependence, unspecified, uncomplicated: Secondary | ICD-10-CM | POA: Diagnosis not present

## 2018-08-09 DIAGNOSIS — J029 Acute pharyngitis, unspecified: Secondary | ICD-10-CM

## 2018-08-09 DIAGNOSIS — R05 Cough: Secondary | ICD-10-CM | POA: Diagnosis not present

## 2018-08-09 DIAGNOSIS — Z20828 Contact with and (suspected) exposure to other viral communicable diseases: Secondary | ICD-10-CM

## 2018-08-09 DIAGNOSIS — R059 Cough, unspecified: Secondary | ICD-10-CM

## 2018-08-09 DIAGNOSIS — Z20822 Contact with and (suspected) exposure to covid-19: Secondary | ICD-10-CM

## 2018-08-09 MED ORDER — AMOXICILLIN 500 MG PO CAPS: 1000.0000 mg | ORAL_CAPSULE | Freq: Two times a day (BID) | ORAL | 0 refills | Status: AC

## 2018-08-09 NOTE — Progress Notes (Addendum)
Lindsey Partin T. [name], MD Primary Care and [place] at [facility] [place], [address] Phone: [phone]  FAX: [place] - 38 y.o. female  MRN [id number]  Date of Birth: 23-May-1980  Visit Date: 08/09/2018  PCP: Jearld Fenton, NP  Referred by: Jearld Fenton, NP Chief Complaint  Patient presents with  . Sore Throat  . Cough    Dry  . Headache  . Generalized Body Aches  . Diarrhea   Virtual Visit via Video Note:  I connected with  Lindsey Davenport on 08/09/2018  9:40 AM EDT by a video enabled telemedicine application and verified that I am speaking with the correct person using two identifiers.   Location patient: home computer, tablet, or smartphone Location provider: work or home office Consent: Verbal consent directly obtained from [employer]. Persons participating in the virtual visit: patient, provider  I discussed the limitations of evaluation and management by telemedicine and the availability of in person appointments. The patient expressed understanding and agreed to proceed.  Interactive audio and video telecommunications were attempted between this provider and patient, however failed, due to patient having technical difficulties OR patient did not have access to video capability.  We continued and completed visit with audio only.    History of Present Illness:  Ha, body ache, cough. Dry cough.  St, 100.5  About 3 ppd.    She really does not feel all that well.  She has some significant sore throat, headache, body ache.  She also has a dry cough.  Fever is a maximum of 100.5.  She does have some global body aches as well.  She has no known COVID-19 exposures.  She does take some psychiatric medicine, but otherwise no significant medical history.  Review of Systems as above: See pertinent positives and pertinent negatives per HPI No acute distress verbally  Past  Medical History, Surgical History, Social History, Family History, Problem List, Medications, and Allergies have been reviewed and updated if relevant.   Observations/Objective/Exam:  An attempt was made to discern vital signs over the phone and per patient if applicable and possible.   General:    Alert, Oriented, appears well and in no acute distress HEENT:     Atraumatic, conjunctiva clear, no obvious abnormalities on inspection of external nose and ears.  Neck:    Normal movements of the head and neck Pulmonary:     On inspection no signs of respiratory distress, breathing rate appears normal, no obvious gross SOB, gasping or wheezing Cardiovascular:    No obvious cyanosis Musculoskeletal:    Moves all visible extremities without noticeable abnormality Psych / Neurological:     Pleasant and cooperative, no obvious depression or anxiety, speech and thought processing grossly intact  Assessment and Plan:    ICD-10-CM   1. Cough  R05 MYCHART COVID-19 HOME MONITORING PROGRAM  2. Sore throat  J02.9 MYCHART COVID-19 HOME MONITORING PROGRAM  3. Fever, unspecified fever cause  R50.9 MYCHART COVID-19 HOME MONITORING PROGRAM  4. Smoker  F17.200 MYCHART COVID-19 HOME MONITORING PROGRAM   Level of Medical Decision-Making in this case is Moderate.   Etiology unclear, ABX and check for Covid-19  I discussed the assessment and treatment plan with the patient. The patient was provided an opportunity to ask questions and all were answered. The patient agreed with the plan and demonstrated an understanding of the instructions.   The patient was advised  to call back or seek an in-person evaluation if the symptoms worsen or if the condition fails to improve as anticipated.  Follow-up: prn unless noted otherwise below No follow-ups on file.  Meds ordered this encounter  Medications  . amoxicillin (AMOXIL) 500 MG capsule    Sig: Take 2 capsules (1,000 mg total) by mouth 2 (two) times daily  for 10 days.    Dispense:  40 capsule    Refill:  0   No orders of the defined types were placed in this encounter.   Signed,  Maud Deed. Jaylin Roundy, MD

## 2018-08-09 NOTE — Telephone Encounter (Addendum)
Contacted pt to schedule testing; pt accepted appointment at [facility] site 08/10/2018 at 0800; pt given address, location, and instructions that she and all occupants of her vehicle should wear masks; pt also informed that results could take 5-7 business days and are available via my chart; she verbalizes understanding and says that she is already signed up; orders placed per protocol; pt also complains of cough; recommendations for treatment of cough; she once again verbalized understanding. 7: COUGH MEDICINES:  * OTC COUGH SYRUPS: The most common cough suppressant in OTC cough medications is dextromethorphan. Often the letters 'DM' appear in the name.  * OTC COUGH DROPS: Cough drops can help a lot, especially for mild coughs. They reduce coughing by soothing your irritated throat and removing that tickle sensation in the back of the throat. Cough drops also have the advantage of portability - you can carry them with you.  * HOME REMEDY - HARD CANDY: Hard candy works just as well as medicine-flavored OTC cough drops. People who have diabetes should use sugar-free candy.  * HOME REMEDY - HONEY: This old home remedy has been shown to help decrease coughing at night. The adult dosage is 2 teaspoons (10 ml) at bedtime. Honey should not be given to infants under one year of age.    8: CAUTION - DEXTROMETHORPHAN:  * Do not try to completely suppress coughs that produce mucus and phlegm. Remember that coughing is helpful in bringing up mucus from the lungs and preventing pneumonia.  * Research Notes: Dextromethorphan in some research studies has been shown to reduce the frequency and severity of cough in adults (18 years or older) without significant adverse effects. However, other studies suggest that dextromethorphan is no better than placebo at reducing a cough.  * Drug Abuse Potential: It should be noted that dextromethorphan has become a drug of abuse. This problem is seen most often in adolescents. Overdose  symptoms can range from giggling and euphoria to hallucinations and coma.  * CONTRAINDICATED: Do not take dextromethorphan if you are taking a monoamine oxidase (MAO) inhibitor now or in the past 2 weeks. Examples of MAO inhibitors include isocarboxazid (Marplan), phenelzine (Nardil), selegiline (Eldepryl, Emsam, Zelapar), and tranylcypromine (Parnate). Do not take dextromethorphan if you are taking venlafaxine (Effexor).  Finally the pt asked if her husband and children should quarantine; pt advised that family should quarantine until she receives her results; she verbalized understanding.   ----- Message from Owens Loffler, MD sent at 08/09/2018 10:17 AM EDT ----- Regarding: Covid-19 testing  Covid-19 testing  [facility] Test Pool: Patient Name: Lindsey Davenport. [place] Date of Birth: 06/15/1980 Reason for Covid-19 test: Fever, headache, sore throat, arthralgias, cough Insurance information: [employer] Eligibility:

## 2018-08-10 ENCOUNTER — Ambulatory Visit (INDEPENDENT_AMBULATORY_CARE_PROVIDER_SITE_OTHER): Payer: BC Managed Care – PPO | Admitting: Psychiatry

## 2018-08-10 ENCOUNTER — Encounter (INDEPENDENT_AMBULATORY_CARE_PROVIDER_SITE_OTHER): Payer: Self-pay

## 2018-08-10 ENCOUNTER — Other Ambulatory Visit: Payer: Self-pay

## 2018-08-10 ENCOUNTER — Telehealth: Payer: Self-pay | Admitting: *Deleted

## 2018-08-10 DIAGNOSIS — F3130 Bipolar disorder, current episode depressed, mild or moderate severity, unspecified: Secondary | ICD-10-CM | POA: Diagnosis not present

## 2018-08-10 DIAGNOSIS — F411 Generalized anxiety disorder: Secondary | ICD-10-CM

## 2018-08-10 DIAGNOSIS — Z20822 Contact with and (suspected) exposure to covid-19: Secondary | ICD-10-CM

## 2018-08-10 MED ORDER — FLUOXETINE HCL 20 MG PO CAPS: 60.0000 mg | ORAL_CAPSULE | Freq: Every day | ORAL | 2 refills | Status: DC

## 2018-08-10 MED ORDER — GABAPENTIN 400 MG PO CAPS: 400.0000 mg | ORAL_CAPSULE | Freq: Four times a day (QID) | ORAL | 5 refills | Status: DC

## 2018-08-10 MED ORDER — ALPRAZOLAM ER 0.5 MG PO TB24: 0.5000 mg | ORAL_TABLET | Freq: Every day | ORAL | 0 refills | Status: DC | PRN

## 2018-08-10 NOTE — Telephone Encounter (Signed)
Contacted pt due to My[facility] Companion responses 7/9/2020of temp 100.3; the pt says that she taks Advil 2 caplets every 6 hours; recommendations: FEVER MEDICINES:  * For fever relief, take acetaminophen or ibuprofen.  * Treat fevers above 101 F (38.3 C).  * The goal of fever therapy is to bring the fever down to a comfortable level. Remember that fever medicine usually lowers fever 2-3 F (1-1.5 C).  ACETAMINOPHEN (E.G., TYLENOL):  * Take 650 mg (two 325 mg pills) by mouth every 4-6 hours as needed. Each Regular Strength Tylenol pill has 325 mg of acetaminophen. The most you should take each day is 3,250 mg (10 Regular Strength pills a day).  * Another choice is to take 1,000 mg (two 500 mg pills) every 8 hours as needed. Each Extra Strength Tylenol pill has 500 mg of acetaminophen. The most you should take each day is 3,000 mg (6 Extra Strength pills a day).  Recommendations made: she verbalized understanding.
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
## 2018-08-12 ENCOUNTER — Encounter (INDEPENDENT_AMBULATORY_CARE_PROVIDER_SITE_OTHER): Payer: Self-pay

## 2018-08-14 LAB — NOVEL CORONAVIRUS, NAA: SARS-CoV-2, NAA: NOT DETECTED

## 2018-08-16 ENCOUNTER — Encounter (INDEPENDENT_AMBULATORY_CARE_PROVIDER_SITE_OTHER): Payer: Self-pay

## 2018-08-19 ENCOUNTER — Encounter (INDEPENDENT_AMBULATORY_CARE_PROVIDER_SITE_OTHER): Payer: Self-pay

## 2018-08-21 ENCOUNTER — Other Ambulatory Visit (HOSPITAL_COMMUNITY): Payer: Self-pay | Admitting: Psychiatry

## 2018-08-21 ENCOUNTER — Telehealth (HOSPITAL_COMMUNITY): Payer: Self-pay

## 2018-08-21 MED ORDER — ALPRAZOLAM ER 0.5 MG PO TB24: 0.5000 mg | ORAL_TABLET | Freq: Every day | ORAL | 0 refills | Status: DC | PRN

## 2018-08-21 NOTE — Telephone Encounter (Signed)
Patient called regarding her medications. She stated that she picked up her Alprazolam 0.5mg , Fluoxetine 20mg , and her Gabapentin 400mg  from the pharmacy and lost them all. She cannot find them. Please review and advise. Thank you.

## 2018-08-21 NOTE — Telephone Encounter (Signed)
I am fine with her getting next (early refill) - I added one for alprazolam (she is only on once daily low dose) and she has refills on other meds. She may need to pay out of pocket though - depending on insurance.

## 2018-08-22 ENCOUNTER — Encounter (INDEPENDENT_AMBULATORY_CARE_PROVIDER_SITE_OTHER): Payer: Self-pay

## 2018-08-22 ENCOUNTER — Telehealth: Payer: Self-pay | Admitting: *Deleted

## 2018-08-22 ENCOUNTER — Encounter: Payer: Self-pay | Admitting: Internal Medicine

## 2018-08-22 NOTE — Telephone Encounter (Signed)
Contacted pt due to MyChart companion response of new cough; the pt says that her cough had returned; she describes it as "dry"; she has taken Robitussin which as helped; she denies shortness of breath; pt also advised to contact her PCP for further recommendations; she verbalized understanding.

## 2018-08-22 NOTE — Telephone Encounter (Signed)
Continue Robitussin OTC.  If symptoms persist or worsen, would recommend making a virtual appointment.

## 2018-09-01 ENCOUNTER — Other Ambulatory Visit (HOSPITAL_COMMUNITY): Payer: Self-pay | Admitting: Psychiatry

## 2018-09-03 ENCOUNTER — Other Ambulatory Visit (HOSPITAL_COMMUNITY): Payer: Self-pay | Admitting: Psychiatry

## 2018-09-05 ENCOUNTER — Telehealth: Payer: Self-pay | Admitting: Internal Medicine

## 2018-09-05 NOTE — Telephone Encounter (Signed)
Patient called and scheduled lab appointment. She stated she is needing a Full panel done.  Can you order these?   Also stated that she is needing a AMA done?

## 2018-09-05 NOTE — Telephone Encounter (Signed)
She is due for physical. She needs to schedule that and labs can be done at that appt

## 2018-09-08 ENCOUNTER — Other Ambulatory Visit: Payer: BC Managed Care – PPO

## 2018-09-12 ENCOUNTER — Encounter: Payer: Self-pay | Admitting: Internal Medicine

## 2018-09-12 ENCOUNTER — Other Ambulatory Visit: Payer: Self-pay

## 2018-09-12 ENCOUNTER — Ambulatory Visit (INDEPENDENT_AMBULATORY_CARE_PROVIDER_SITE_OTHER): Payer: BC Managed Care – PPO | Admitting: Internal Medicine

## 2018-09-12 VITALS — BP 118/62 | HR 77 | Temp 98.6°F | Ht 63.0 in | Wt 165.4 lb

## 2018-09-12 DIAGNOSIS — R102 Pelvic and perineal pain: Secondary | ICD-10-CM

## 2018-09-12 DIAGNOSIS — N898 Other specified noninflammatory disorders of vagina: Secondary | ICD-10-CM | POA: Diagnosis not present
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
## 2018-09-12 NOTE — Patient Instructions (Signed)
Vaginitis  Vaginitis is irritation and swelling (inflammation) of the vagina. It happens when normal bacteria and yeast in the vagina grow too much. There are many types of this condition. Treatment will depend on the type you have. Follow these instructions at home: Lifestyle  Keep your vagina area clean and dry. ? Avoid using soap. ? Rinse the area with water.  Do not do the following until your doctor says it is okay: ? Wash and clean out the vagina (douche). ? Use tampons. ? Have sex.  Wipe from front to back after going to the bathroom.  Let air reach your vagina. ? Wear cotton underwear. ? Do not wear: ? Underwear while you sleep. ? Tight pants. ? Thong underwear. ? Underwear or nylons without a cotton panel. ? Take off any wet clothing, such as bathing suits, as soon as possible.  Use gentle, non-scented products. Do not use things that can irritate the vagina, such as fabric softeners. Avoid the following products if they are scented: ? Feminine sprays. ? Detergents. ? Tampons. ? Feminine hygiene products. ? Soaps or bubble baths.  Practice safe sex and use condoms. General instructions  Take over-the-counter and prescription medicines only as told by your doctor.  If you were prescribed an antibiotic medicine, take or use it as told by your doctor. Do not stop taking or using the antibiotic even if you start to feel better.  Keep all follow-up visits as told by your doctor. This is important. Contact a doctor if:  You have pain in your belly.  You have a fever.  Your symptoms last for more than 2-3 days. Get help right away if:  You have a fever and your symptoms get worse all of a sudden. Summary  Vaginitis is irritation and swelling of the vagina. It can happen when the normal bacteria and yeast in the vagina grow too much. There are many types.  Treatment will depend on the type you have.  Do not douche, use tampons , or have sex until your health  care provider approves. When you can return to sex, practice safe sex and use condoms. This information is not intended to replace advice given to you by your health care provider. Make sure you discuss any questions you have with your health care provider. Document Released: 04/16/2008 Document Revised: 12/31/2016 Document Reviewed: 02/10/2016 Elsevier Patient Education  2020 [employer].

## 2018-09-13 ENCOUNTER — Encounter: Payer: Self-pay | Admitting: Internal Medicine

## 2018-09-13 LAB — WET PREP BY MOLECULAR PROBE
Candida species: NOT DETECTED
Gardnerella vaginalis: NOT DETECTED
MICRO NUMBER:: 758953
SPECIMEN QUALITY:: ADEQUATE
Trichomonas vaginosis: DETECTED — AB

## 2018-09-13 MED ORDER — METRONIDAZOLE 500 MG PO TABS: 500.0000 mg | ORAL_TABLET | Freq: Two times a day (BID) | ORAL | 0 refills | Status: DC

## 2018-09-13 NOTE — Addendum Note (Signed)
Addended by: Jearld Fenton on: 09/13/2018 01:52 PM   Modules accepted: Orders

## 2018-09-13 NOTE — Telephone Encounter (Signed)
Letter mailed letting pt know she will need to schedule CPE

## 2018-09-14 ENCOUNTER — Other Ambulatory Visit: Payer: Self-pay

## 2018-09-14 ENCOUNTER — Ambulatory Visit (INDEPENDENT_AMBULATORY_CARE_PROVIDER_SITE_OTHER): Payer: BC Managed Care – PPO | Admitting: Psychiatry

## 2018-09-14 DIAGNOSIS — F3130 Bipolar disorder, current episode depressed, mild or moderate severity, unspecified: Secondary | ICD-10-CM | POA: Diagnosis not present

## 2018-09-14 DIAGNOSIS — F411 Generalized anxiety disorder: Secondary | ICD-10-CM | POA: Diagnosis not present

## 2018-09-14 DIAGNOSIS — F1421 Cocaine dependence, in remission: Secondary | ICD-10-CM

## 2018-09-14 MED ORDER — ALPRAZOLAM ER 0.5 MG PO TB24: 0.5000 mg | ORAL_TABLET | Freq: Two times a day (BID) | ORAL | 1 refills | Status: DC | PRN
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
## 2018-09-21 ENCOUNTER — Ambulatory Visit (INDEPENDENT_AMBULATORY_CARE_PROVIDER_SITE_OTHER): Payer: BC Managed Care – PPO | Admitting: Internal Medicine

## 2018-09-21 ENCOUNTER — Other Ambulatory Visit: Payer: Self-pay | Admitting: Obstetrics and Gynecology

## 2018-09-21 ENCOUNTER — Other Ambulatory Visit: Payer: Self-pay

## 2018-09-21 ENCOUNTER — Encounter: Payer: Self-pay | Admitting: Internal Medicine

## 2018-09-21 VITALS — BP 116/78 | HR 72 | Temp 98.7°F | Ht 62.0 in | Wt 167.0 lb

## 2018-09-21 DIAGNOSIS — Z1231 Encounter for screening mammogram for malignant neoplasm of breast: Secondary | ICD-10-CM

## 2018-09-21 DIAGNOSIS — E039 Hypothyroidism, unspecified: Secondary | ICD-10-CM

## 2018-09-21 DIAGNOSIS — Z0001 Encounter for general adult medical examination with abnormal findings: Secondary | ICD-10-CM | POA: Diagnosis not present

## 2018-09-21 LAB — COMPREHENSIVE METABOLIC PANEL
ALT: 59 U/L — ABNORMAL HIGH (ref 0–35)
AST: 26 U/L (ref 0–37)
Albumin: 4.5 g/dL (ref 3.5–5.2)
Alkaline Phosphatase: 107 U/L (ref 39–117)
BUN: 19 mg/dL (ref 6–23)
CO2: 29 mEq/L (ref 19–32)
Calcium: 9.6 mg/dL (ref 8.4–10.5)
Chloride: 102 mEq/L (ref 96–112)
Creatinine, Ser: 0.65 mg/dL (ref 0.40–1.20)
GFR: 101.16 mL/min (ref >60.00)
Glucose, Bld: 106 mg/dL — ABNORMAL HIGH (ref 70–99)
Potassium: 5.1 mEq/L (ref 3.5–5.1)
Sodium: 139 mEq/L (ref 135–145)
Total Bilirubin: 0.6 mg/dL (ref 0.2–1.2)
Total Protein: 7 g/dL (ref 6.0–8.3)

## 2018-09-21 LAB — CBC
HCT: 42.6 % (ref 36.0–46.0)
Hemoglobin: 14.1 g/dL (ref 12.0–15.0)
MCHC: 33.2 g/dL (ref 30.0–36.0)
MCV: 92.6 fl (ref 78.0–100.0)
Platelets: 249 10*3/uL (ref 150.0–400.0)
RBC: 4.6 Mil/uL (ref 3.87–5.11)
RDW: 13.7 % (ref 11.5–15.5)
WBC: 6.2 10*3/uL (ref 4.0–10.5)

## 2018-09-21 LAB — VITAMIN D 25 HYDROXY (VIT D DEFICIENCY, FRACTURES): VITD: 37.36 ng/mL (ref 30.00–100.00)

## 2018-09-21 LAB — LIPID PANEL
Cholesterol: 248 mg/dL — ABNORMAL HIGH (ref 0–200)
HDL: 61.1 mg/dL (ref >39.00)
LDL Cholesterol: 166 mg/dL — ABNORMAL HIGH (ref 0–99)
NonHDL: 186.78
Total CHOL/HDL Ratio: 4
Triglycerides: 102 mg/dL (ref 0.0–149.0)
VLDL: 20.4 mg/dL (ref 0.0–40.0)

## 2018-09-21 LAB — T4, FREE: Free T4: 0.83 ng/dL (ref 0.60–1.60)

## 2018-09-21 LAB — HEMOGLOBIN A1C: Hgb A1c MFr Bld: 6.1 % (ref 4.6–6.5)

## 2018-09-21 LAB — TSH: TSH: 1.1 u[IU]/mL (ref 0.35–4.50)
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
## 2018-09-21 NOTE — Patient Instructions (Signed)
Health Maintenance, Female Adopting a healthy lifestyle and getting preventive care are important in promoting health and wellness. Ask your health care provider about:  The right schedule for you to have regular tests and exams.  Things you can do on your own to prevent diseases and keep yourself healthy. What should I know about diet, weight, and exercise? Eat a healthy diet   Eat a diet that includes plenty of vegetables, fruits, low-fat dairy products, and lean protein.  Do not eat a lot of foods that are high in solid fats, added sugars, or sodium. Maintain a healthy weight Body mass index (BMI) is used to identify weight problems. It estimates body fat based on height and weight. Your health care provider can help determine your BMI and help you achieve or maintain a healthy weight. Get regular exercise Get regular exercise. This is one of the most important things you can do for your health. Most adults should:  Exercise for at least 150 minutes each week. The exercise should increase your heart rate and make you sweat (moderate-intensity exercise).  Do strengthening exercises at least twice a week. This is in addition to the moderate-intensity exercise.  Spend less time sitting. Even light physical activity can be beneficial. Watch cholesterol and blood lipids Have your blood tested for lipids and cholesterol at 39 years of age, then have this test every 5 years. Have your cholesterol levels checked more often if:  Your lipid or cholesterol levels are high.  You are older than 40 years of age.  You are at high risk for heart disease. What should I know about cancer screening? Depending on your health history and family history, you may need to have cancer screening at various ages. This may include screening for:  Breast cancer.  Cervical cancer.  Colorectal cancer.  Skin cancer.  Lung cancer. What should I know about heart disease, diabetes, and high blood  pressure? Blood pressure and heart disease  High blood pressure causes heart disease and increases the risk of stroke. This is more likely to develop in people who have high blood pressure readings, are of African descent, or are overweight.  Have your blood pressure checked: ? Every 3-5 years if you are 18-39 years of age. ? Every year if you are 40 years old or older. Diabetes Have regular diabetes screenings. This checks your fasting blood sugar level. Have the screening done:  Once every three years after age 40 if you are at a normal weight and have a low risk for diabetes.  More often and at a younger age if you are overweight or have a high risk for diabetes. What should I know about preventing infection? Hepatitis B If you have a higher risk for hepatitis B, you should be screened for this virus. Talk with your health care provider to find out if you are at risk for hepatitis B infection. Hepatitis C Testing is recommended for:  Everyone born from 1945 through 1965.  Anyone with known risk factors for hepatitis C. Sexually transmitted infections (STIs)  Get screened for STIs, including gonorrhea and chlamydia, if: ? You are sexually active and are younger than 39 years of age. ? You are older than 39 years of age and your health care provider tells you that you are at risk for this type of infection. ? Your sexual activity has changed since you were last screened, and you are at increased risk for chlamydia or gonorrhea. Ask your health care provider if   you are at risk.  Ask your health care provider about whether you are at high risk for HIV. Your health care provider may recommend a prescription medicine to help prevent HIV infection. If you choose to take medicine to prevent HIV, you should first get tested for HIV. You should then be tested every 3 months for as long as you are taking the medicine. Pregnancy  If you are about to stop having your period (premenopausal) and  you may become pregnant, seek counseling before you get pregnant.  Take 400 to 800 micrograms (mcg) of folic acid every day if you become pregnant.  Ask for birth control (contraception) if you want to prevent pregnancy. Osteoporosis and menopause Osteoporosis is a disease in which the bones lose minerals and strength with aging. This can result in bone fractures. If you are 65 years old or older, or if you are at risk for osteoporosis and fractures, ask your health care provider if you should:  Be screened for bone loss.  Take a calcium or vitamin D supplement to lower your risk of fractures.  Be given hormone replacement therapy (HRT) to treat symptoms of menopause. Follow these instructions at home: Lifestyle  Do not use any products that contain nicotine or tobacco, such as cigarettes, e-cigarettes, and chewing tobacco. If you need help quitting, ask your health care provider.  Do not use street drugs.  Do not share needles.  Ask your health care provider for help if you need support or information about quitting drugs. Alcohol use  Do not drink alcohol if: ? Your health care provider tells you not to drink. ? You are pregnant, may be pregnant, or are planning to become pregnant.  If you drink alcohol: ? Limit how much you use to 0-1 drink a day. ? Limit intake if you are breastfeeding.  Be aware of how much alcohol is in your drink. In the [place] one drink equals one 12 oz bottle of beer (355 mL), one 5 oz glass of wine (148 mL), or one 1 oz glass of hard liquor (44 mL). General instructions  Schedule regular health, dental, and eye exams.  Stay current with your vaccines.  Tell your health care provider if: ? You often feel depressed. ? You have ever been abused or do not feel safe at home. Summary  Adopting a healthy lifestyle and getting preventive care are important in promoting health and wellness.  Follow your health care provider's instructions about healthy  diet, exercising, and getting tested or screened for diseases.  Follow your health care provider's instructions on monitoring your cholesterol and blood pressure. This information is not intended to replace advice given to you by your health care provider. Make sure you discuss any questions you have with your health care provider. Document Released: 08/03/2010 Document Revised: 01/11/2018 Document Reviewed: 01/11/2018 Elsevier Patient Education  2020 [employer]  

## 2018-09-22 ENCOUNTER — Other Ambulatory Visit (HOSPITAL_COMMUNITY): Payer: Self-pay | Admitting: Psychiatry

## 2018-09-22 ENCOUNTER — Telehealth: Payer: Self-pay

## 2018-09-22 MED ORDER — GABAPENTIN 400 MG PO CAPS: 400.0000 mg | ORAL_CAPSULE | Freq: Four times a day (QID) | ORAL | 2 refills | Status: DC

## 2018-09-22 NOTE — Telephone Encounter (Signed)
I did clarify the dosing - it should have been copied from previous Rx but was not.

## 2018-09-22 NOTE — Telephone Encounter (Signed)
We received a fax from the pharmacy asking for clarification on the sig for the patient's Gabapentin 400mg  capsule. It says "please see attached for detailed directions" so the pharmacy stated that they don't know how to fill this medication. Please review and advise. Thank you.

## 2018-09-26 ENCOUNTER — Encounter: Payer: Self-pay | Admitting: Internal Medicine

## 2018-09-27 ENCOUNTER — Other Ambulatory Visit: Payer: Self-pay

## 2018-09-27 ENCOUNTER — Encounter: Payer: Self-pay | Admitting: Emergency Medicine

## 2018-09-27 ENCOUNTER — Emergency Department
Admission: EM | Admit: 2018-09-27 | Discharge: 2018-09-27 | Disposition: A | Discharge status: Home / Self Care | Payer: BC Managed Care – PPO | Attending: Emergency Medicine | Admitting: Emergency Medicine

## 2018-09-27 DIAGNOSIS — J3489 Other specified disorders of nose and nasal sinuses: Secondary | ICD-10-CM

## 2018-09-27 DIAGNOSIS — Z20828 Contact with and (suspected) exposure to other viral communicable diseases: Secondary | ICD-10-CM | POA: Insufficient documentation

## 2018-09-27 DIAGNOSIS — Z79899 Other long term (current) drug therapy: Secondary | ICD-10-CM | POA: Insufficient documentation

## 2018-09-27 DIAGNOSIS — R509 Fever, unspecified: Secondary | ICD-10-CM | POA: Diagnosis present

## 2018-09-27 DIAGNOSIS — J029 Acute pharyngitis, unspecified: Secondary | ICD-10-CM

## 2018-09-27 DIAGNOSIS — Z87891 Personal history of nicotine dependence: Secondary | ICD-10-CM | POA: Insufficient documentation

## 2018-09-27 DIAGNOSIS — E039 Hypothyroidism, unspecified: Secondary | ICD-10-CM | POA: Diagnosis not present

## 2018-09-27 LAB — GROUP A STREP BY PCR: Group A Strep by PCR: NOT DETECTED

## 2018-09-27 MED ORDER — AMOXICILLIN 875 MG PO TABS: 875.0000 mg | ORAL_TABLET | Freq: Two times a day (BID) | ORAL | 0 refills | Status: DC

## 2018-09-27 MED ORDER — AMOXICILLIN 500 MG PO CAPS
1000.0000 mg | ORAL_CAPSULE | Freq: Once | ORAL | Status: AC
  Administered 2018-09-27: 1000 mg via ORAL
  Filled 2018-09-27: qty 2

## 2018-09-27 MED ORDER — ACETAMINOPHEN 500 MG PO TABS
1000.0000 mg | ORAL_TABLET | Freq: Once | ORAL | Status: AC
  Administered 2018-09-27: 16:00:00 1000 mg via ORAL
  Filled 2018-09-27: qty 2

## 2018-09-27 NOTE — ED Triage Notes (Addendum)
Pt c/o sore throat xfew days with fever yesterday (100.5) and dry cough today. NAD noted . No oral swelling, RR even and unlabored. Denies SOB

## 2018-09-27 NOTE — Discharge Instructions (Addendum)
Your COVID test is pending.  If positive we need to continue with Tylenol every 6 hours as needed for fevers.  Make sure you are drinking lots of fluids and self isolate for 10 days.  If any shortness of breath or difficulty breathing or fevers above 102 that are not going down with Tylenol return to the ER immediately.

## 2018-09-27 NOTE — ED Notes (Signed)
See triage note  Presents with sore throat,chills and dry cough  States temp at home was 100.5 yesterday
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
## 2018-09-28 LAB — SARS CORONAVIRUS 2 (TAT 6-24 HRS): SARS Coronavirus 2: NEGATIVE

## 2018-10-03 ENCOUNTER — Encounter: Payer: Self-pay | Admitting: Internal Medicine

## 2018-10-06 ENCOUNTER — Other Ambulatory Visit: Payer: Self-pay

## 2018-10-06 ENCOUNTER — Encounter: Payer: Self-pay | Admitting: Plastic Surgery

## 2018-10-06 ENCOUNTER — Ambulatory Visit (INDEPENDENT_AMBULATORY_CARE_PROVIDER_SITE_OTHER): Payer: BC Managed Care – PPO | Admitting: Plastic Surgery

## 2018-10-06 VITALS — BP 121/83 | HR 60 | Temp 98.4°F | Resp 16 | Ht 63.0 in | Wt 165.0 lb

## 2018-10-06 DIAGNOSIS — F3130 Bipolar disorder, current episode depressed, mild or moderate severity, unspecified: Secondary | ICD-10-CM | POA: Diagnosis not present

## 2018-10-06 DIAGNOSIS — F4310 Post-traumatic stress disorder, unspecified: Secondary | ICD-10-CM | POA: Diagnosis not present

## 2018-10-06 DIAGNOSIS — M546 Pain in thoracic spine: Secondary | ICD-10-CM

## 2018-10-06 DIAGNOSIS — N62 Hypertrophy of breast: Secondary | ICD-10-CM

## 2018-10-06 DIAGNOSIS — G8929 Other chronic pain: Secondary | ICD-10-CM

## 2018-10-06 DIAGNOSIS — M549 Dorsalgia, unspecified: Secondary | ICD-10-CM | POA: Insufficient documentation

## 2018-10-06 DIAGNOSIS — M542 Cervicalgia: Secondary | ICD-10-CM | POA: Insufficient documentation
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
## 2018-10-11 ENCOUNTER — Encounter: Payer: Self-pay | Admitting: Internal Medicine

## 2018-10-18 ENCOUNTER — Other Ambulatory Visit: Payer: Self-pay

## 2018-10-18 ENCOUNTER — Ambulatory Visit (INDEPENDENT_AMBULATORY_CARE_PROVIDER_SITE_OTHER): Payer: BC Managed Care – PPO | Admitting: Psychiatry

## 2018-10-18 DIAGNOSIS — F1421 Cocaine dependence, in remission: Secondary | ICD-10-CM

## 2018-10-18 DIAGNOSIS — F411 Generalized anxiety disorder: Secondary | ICD-10-CM

## 2018-10-18 DIAGNOSIS — F3130 Bipolar disorder, current episode depressed, mild or moderate severity, unspecified: Secondary | ICD-10-CM

## 2018-10-18 MED ORDER — ALPRAZOLAM ER 1 MG PO TB24: 1.0000 mg | ORAL_TABLET | Freq: Two times a day (BID) | ORAL | 2 refills | Status: AC | PRN
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
## 2018-10-24 ENCOUNTER — Ambulatory Visit (INDEPENDENT_AMBULATORY_CARE_PROVIDER_SITE_OTHER): Payer: BC Managed Care – PPO | Admitting: Internal Medicine

## 2018-10-24 ENCOUNTER — Other Ambulatory Visit: Payer: Self-pay

## 2018-10-24 ENCOUNTER — Encounter: Payer: Self-pay | Admitting: Internal Medicine

## 2018-10-24 VITALS — BP 118/72 | HR 75 | Temp 97.5°F | Wt 166.0 lb

## 2018-10-24 DIAGNOSIS — A5901 Trichomonal vulvovaginitis: Secondary | ICD-10-CM | POA: Diagnosis not present

## 2018-10-24 DIAGNOSIS — Z113 Encounter for screening for infections with a predominantly sexual mode of transmission: Secondary | ICD-10-CM | POA: Diagnosis not present
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
## 2018-10-24 NOTE — Patient Instructions (Signed)

## 2018-10-25 LAB — WET PREP BY MOLECULAR PROBE
Candida species: NOT DETECTED
MICRO NUMBER:: 908749
SPECIMEN QUALITY:: ADEQUATE
Trichomonas vaginosis: NOT DETECTED

## 2018-11-03 ENCOUNTER — Other Ambulatory Visit: Payer: Self-pay | Admitting: Obstetrics and Gynecology

## 2018-11-03 ENCOUNTER — Telehealth: Payer: Self-pay

## 2018-11-03 DIAGNOSIS — Z1231 Encounter for screening mammogram for malignant neoplasm of breast: Secondary | ICD-10-CM

## 2018-11-03 MED ORDER — METRONIDAZOLE 0.75 % VA GEL: 1.0000 | Freq: Two times a day (BID) | VAGINAL | 0 refills | Status: DC

## 2018-11-03 NOTE — Telephone Encounter (Signed)
Patient called following up on Wet Prep from last week. Advised patient of results and Regina's comments. Patient states she does have vaginal irritation and odor. Please let patient know today if something is sent in and if she does not answer please leave a detailed message. [facility]. Thank you

## 2018-11-03 NOTE — Addendum Note (Signed)
Addended by: Jearld Fenton on: 11/03/2018 03:03 PM   Modules accepted: Orders

## 2018-11-03 NOTE — Telephone Encounter (Signed)
metrogel sent to pharmacy

## 2018-11-06 ENCOUNTER — Telehealth: Payer: Self-pay | Admitting: Internal Medicine

## 2018-11-06 NOTE — Telephone Encounter (Signed)
Patient cancelled virtual visit for tomorrow  She stated that her husband was exposed to someone who tested positive for covid so [facility] are [facility] to be [facility]

## 2018-11-06 NOTE — Telephone Encounter (Signed)
noted 

## 2018-11-07 ENCOUNTER — Ambulatory Visit: Payer: BC Managed Care – PPO | Admitting: Internal Medicine

## 2018-11-09 ENCOUNTER — Ambulatory Visit: Payer: BC Managed Care – PPO

## 2018-11-20 ENCOUNTER — Encounter: Payer: Self-pay | Admitting: Internal Medicine

## 2018-11-20 ENCOUNTER — Ambulatory Visit: Payer: BC Managed Care – PPO | Admitting: Psychology

## 2018-11-21 ENCOUNTER — Encounter: Payer: Self-pay | Admitting: Family Medicine

## 2018-11-21 ENCOUNTER — Ambulatory Visit (INDEPENDENT_AMBULATORY_CARE_PROVIDER_SITE_OTHER): Payer: BC Managed Care – PPO | Admitting: Family Medicine

## 2018-11-21 VITALS — Temp 99.8°F | Ht 62.0 in | Wt 160.0 lb

## 2018-11-21 DIAGNOSIS — R05 Cough: Secondary | ICD-10-CM | POA: Diagnosis not present

## 2018-11-21 DIAGNOSIS — R053 Chronic cough: Secondary | ICD-10-CM | POA: Insufficient documentation

## 2018-11-21 MED ORDER — GUAIFENESIN-CODEINE 100-10 MG/5ML PO SYRP: 5.0000 mL | ORAL_SOLUTION | Freq: Every evening | ORAL | 0 refills | Status: DC | PRN

## 2018-11-21 MED ORDER — ALBUTEROL SULFATE HFA 108 (90 BASE) MCG/ACT IN AERS: 2.0000 | INHALATION_SPRAY | Freq: Four times a day (QID) | RESPIRATORY_TRACT | 0 refills | Status: DC | PRN

## 2018-11-21 NOTE — Assessment & Plan Note (Signed)
Nonsmoker.  No clear ongoing infection at this point. Most likely post infectious bronchospasm.. treat with albuterol.  If not improving with time... consider in person exam for pulmonary exam.   Also provided codeine cough suppressant.. substance abuse history.. nonarcotic... no red flags on pdmp review.
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
## 2018-11-27 NOTE — Progress Notes (Signed)
Subjective:     Patient ID: Lindsey Davenport, female    DOB: [birth year], 38 y.o.   MRN: [id number]  Chief Complaint  Patient presents with  . Pre-op Exam    (B) breast reduction    HPI: The patient is a 38 y.o. female.  Lindsey Davenport is a 38 year old female who was here for preoperative evaluation prior to breast reduction surgery on December 07, 2018. She is scheduled for mammogram tomorrow.  Upon evaluation she reported that she has started smoking again since her last visit with Dr. Marla Roe for consultation of reduction.  Per her husband she is smoking approximately a pack per day but she reports half pack per day.  She also reports that she was just diagnosed as prediabetic in August with an A1c of 6.1.  She was also recently diagnosed as having high cholesterol.  She also reports that a few weeks ago she was prescribed albuterol after a televisit.  At that time she reports that she was having some wheezing and now is using her albuterol inhaler 2-3 times per day.   She denies any recent fever, chills, nausea, vomiting.  She has hyperpigmentation of the inframammary area on both sides.  The sternal to nipple distance on the right is 30 cm and the left is 30 cm.  The IMF distance is 15 cm.  She is 5 feet 3 inches tall and weighs 165 pounds.  Preoperative bra size = 36 DDD cup.  Would like to be smaller may be a C/D cup.  The estimated excess breast tissue to be removed at the time of surgery is 550 grams on the left and 550 grams on the right.  She reports that since her last visit, she has gained ~ 20lbs, on exam and vitals, it appears she has gained ~ 5 lbs.   Review of Systems  Constitutional: Positive for activity change and unexpected weight change. Negative for chills, diaphoresis, fatigue and fever.  Respiratory: Positive for wheezing. Negative for shortness of breath.   Cardiovascular: Negative for chest pain and leg swelling.  Gastrointestinal: Negative for diarrhea,  nausea and vomiting.  Musculoskeletal: Positive for back pain and neck pain.  Skin: Negative for color change and rash.  Neurological: Negative for dizziness, weakness and headaches.    Objective:   Vital Signs BP 126/82 (BP Location: Left Arm, Patient Position: Sitting, Cuff Size: Normal)   Pulse 89   Temp 97.7 F (36.5 C) (Temporal)   Ht 5\' 2"  (1.575 m)   Wt 171 lb 12.8 oz (77.9 kg)   SpO2 98%   BMI 31.42 kg/m  Vital Signs and Nursing Note Reviewed Chaperone present Physical Exam  Constitutional: She is oriented to person, place, and time and well-developed, well-nourished, and in no distress.  HENT:  Head: Normocephalic and atraumatic.  Cardiovascular: Normal rate.  Pulmonary/Chest: Effort normal. No respiratory distress.  Musculoskeletal: Normal range of motion.  Neurological: She is alert and oriented to person, place, and time. Gait normal.  Skin: Skin is warm and dry. No rash noted. She is not diaphoretic. No erythema. No pallor.  Psychiatric: Mood and affect normal.    Assessment/Plan:     ICD-10-CM   1. Chronic bilateral thoracic back pain  M54.6    G89.29   2. Neck pain  M54.2   3. Symptomatic mammary hypertrophy  N62    Due to patient smoking, postpone bilateral breast reduction until no smoking for 6 weeks.  Patient to call in 4 weeks  with update and to schedule surgical intervention. She knows the importance of quitting smoking on wound healing and decreasing risk of necrosis. She agrees and understands.  Will require preop nicotine test.   Carola Rhine Nykole Matos, PA-C 11/28/2018, 11:37 AM

## 2018-11-28 ENCOUNTER — Encounter: Payer: Self-pay | Admitting: Surgical

## 2018-11-28 ENCOUNTER — Ambulatory Visit (INDEPENDENT_AMBULATORY_CARE_PROVIDER_SITE_OTHER): Payer: BC Managed Care – PPO | Admitting: Internal Medicine

## 2018-11-28 ENCOUNTER — Other Ambulatory Visit: Payer: Self-pay

## 2018-11-28 ENCOUNTER — Ambulatory Visit (INDEPENDENT_AMBULATORY_CARE_PROVIDER_SITE_OTHER): Payer: BC Managed Care – PPO | Admitting: Surgical

## 2018-11-28 ENCOUNTER — Encounter: Payer: Self-pay | Admitting: Internal Medicine

## 2018-11-28 VITALS — BP 126/84 | HR 84 | Temp 97.7°F | Wt 169.0 lb

## 2018-11-28 VITALS — BP 126/82 | HR 89 | Temp 97.7°F | Ht 62.0 in | Wt 171.8 lb

## 2018-11-28 DIAGNOSIS — Z23 Encounter for immunization: Secondary | ICD-10-CM | POA: Diagnosis not present

## 2018-11-28 DIAGNOSIS — H539 Unspecified visual disturbance: Secondary | ICD-10-CM

## 2018-11-28 DIAGNOSIS — M542 Cervicalgia: Secondary | ICD-10-CM

## 2018-11-28 DIAGNOSIS — R300 Dysuria: Secondary | ICD-10-CM | POA: Diagnosis not present

## 2018-11-28 DIAGNOSIS — M546 Pain in thoracic spine: Secondary | ICD-10-CM

## 2018-11-28 DIAGNOSIS — H9312 Tinnitus, left ear: Secondary | ICD-10-CM | POA: Diagnosis not present

## 2018-11-28 DIAGNOSIS — G8929 Other chronic pain: Secondary | ICD-10-CM

## 2018-11-28 DIAGNOSIS — R42 Dizziness and giddiness: Secondary | ICD-10-CM

## 2018-11-28 DIAGNOSIS — N62 Hypertrophy of breast: Secondary | ICD-10-CM

## 2018-11-28 LAB — POC URINALSYSI DIPSTICK (AUTOMATED)
Bilirubin, UA: NEGATIVE
Blood, UA: NEGATIVE
Glucose, UA: NEGATIVE
Leukocytes, UA: NEGATIVE
Nitrite, UA: NEGATIVE
Protein, UA: NEGATIVE
Spec Grav, UA: 1.02 (ref 1.010–1.025)
Urobilinogen, UA: 0.2 E.U./dL
pH, UA: 6 (ref 5.0–8.0)

## 2018-11-28 NOTE — Addendum Note (Signed)
Addended by: Lurlean Nanny on: 11/28/2018 11:04 AM   Modules accepted: Orders

## 2018-11-28 NOTE — Patient Instructions (Signed)
Tinnitus Tinnitus refers to hearing a sound when there is no actual source for that sound. This is often described as ringing in the ears. However, people with this condition may hear a variety of noises, in one ear or in both ears. The sounds of tinnitus can be soft, loud, or somewhere in between. Tinnitus can last for a few seconds or can be constant for days. It may go away without treatment and come back at various times. When tinnitus is constant or happens often, it can lead to other problems, such as trouble sleeping and trouble concentrating. Almost everyone experiences tinnitus at some point. Tinnitus that is long-lasting (chronic) or comes back often (recurs) may require medical attention. What are the causes? The cause of tinnitus is often not known. In some cases, it can result from other problems or conditions, including:  Exposure to loud noises from machinery, music, or other sources.  Hearing loss.  Ear or sinus infections.  Earwax buildup.  An object (foreign body) stuck in the ear.  Taking certain medicines.  Drinking alcohol or caffeine.  High blood pressure.  Heart diseases.  Anemia.  Allergies.  Meniere's disease.  Thyroid problems.  Tumors.  A weak, bulging blood vessel (aneurysm) near the ear.  Depression or other mood disorders. What are the signs or symptoms? The main symptom of tinnitus is hearing a sound when there is no source for that sound. It may sound like:  Buzzing.  Roaring.  Ringing.  Blowing air, like the sound heard when you listen to a seashell.  Hissing.  Whistling.  Sizzling.  Humming.  Running water.  A musical note.  Tapping. Symptoms may affect only one ear (unilateral) or both ears (bilateral). How is this diagnosed? Tinnitus is diagnosed based on your symptoms, your medical history, and a physical exam. Your health care provider may do a thorough hearing test (audiologic exam) if your tinnitus:  Is  unilateral.  Causes hearing difficulties.  Lasts 6 months or longer. You may work with a health care provider who specializes in hearing disorders (audiologist). You may be asked questions about your symptoms and how they affect your daily life. You may have other tests done, such as:  CT scan.  MRI.  An imaging test of how blood flows through your blood vessels (angiogram). How is this treated? Treating an underlying medical condition can sometimes make tinnitus go away. If your tinnitus continues, other treatments may include:  Medicines, such as antidepressants or sleeping aids.  Sound generators to mask the tinnitus. These include: ? Tabletop sound machines that play relaxing sounds to help you fall asleep. ? Wearable devices that fit in your ear and play sounds or music. ? Acoustic neural stimulation. This involves using headphones to listen to music that contains an auditory signal. Over time, listening to this signal may change some pathways in your brain and make you less sensitive to tinnitus. This treatment is used for very severe cases when no other treatment is working.  Therapy and counseling to help you manage the stress of living with tinnitus.  Using hearing aids or cochlear implants if your tinnitus is related to hearing loss. Hearing aids are worn in the outer ear. Cochlear implants are surgically placed in the inner ear. Follow these instructions at home: Managing symptoms      When possible, avoid being in loud places and being exposed to loud sounds.  Wear hearing protection, such as earplugs, when you are exposed to loud noises.  Use   a white noise machine, a humidifier, or other devices to mask the sound of tinnitus.  Practice techniques for reducing stress, such as meditation, yoga, or deep breathing. Work with your health care provider if you need help with managing stress.  Sleep with your head slightly raised. This may reduce the impact of tinnitus.  General instructions  Do not use stimulants, such as nicotine, alcohol, or caffeine. Talk with your health care provider about other stimulants to avoid. Stimulants are substances that can make you feel alert and attentive by increasing certain activities in the body (such as heart rate and blood pressure). These substances may make tinnitus worse.  Take over-the-counter and prescription medicines only as told by your health care provider.  Try to get plenty of sleep each night.  Keep all follow-up visits as told by your health care provider. This is important. Contact a health care provider if:  Your tinnitus continues for 3 weeks or longer without stopping.  Your symptoms get worse or do not get better with home care.  You develop tinnitus after a head injury.  You have tinnitus along with any of the following: ? Dizziness. ? Loss of balance. ? Nausea and vomiting. Summary  Tinnitus refers to hearing a sound when there is no actual source for that sound. This is often described as ringing in the ears.  Symptoms may affect only one ear (unilateral) or both ears (bilateral).  Use a white noise machine, a humidifier, or other devices to mask the sound of tinnitus.  Do not use stimulants, such as nicotine, alcohol, or caffeine. Talk with your health care provider about other stimulants to avoid. These substances may make tinnitus worse. This information is not intended to replace advice given to you by your health care provider. Make sure you discuss any questions you have with your health care provider. Document Released: 01/18/2005 Document Revised: 12/31/2016 Document Reviewed: 10/28/2016 Elsevier Patient Education  2020 Elsevier Inc.  
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
## 2018-11-29 ENCOUNTER — Ambulatory Visit
Admission: RE | Admit: 2018-11-29 | Discharge: 2018-11-29 | Disposition: A | Discharge status: Home / Self Care | Payer: BC Managed Care – PPO | Source: Ambulatory Visit | Attending: Obstetrics and Gynecology | Admitting: Obstetrics and Gynecology

## 2018-11-29 DIAGNOSIS — Z1231 Encounter for screening mammogram for malignant neoplasm of breast: Secondary | ICD-10-CM

## 2018-11-29 LAB — WET PREP BY MOLECULAR PROBE
Candida species: NOT DETECTED
MICRO NUMBER:: 1035022
SPECIMEN QUALITY:: ADEQUATE
Trichomonas vaginosis: NOT DETECTED

## 2018-11-29 LAB — URINE CULTURE
MICRO NUMBER:: 1035023
SPECIMEN QUALITY:: ADEQUATE

## 2018-12-04 ENCOUNTER — Encounter: Payer: Self-pay | Admitting: Internal Medicine

## 2018-12-04 ENCOUNTER — Other Ambulatory Visit (HOSPITAL_COMMUNITY): Payer: BC Managed Care – PPO

## 2018-12-04 ENCOUNTER — Other Ambulatory Visit: Payer: Self-pay | Admitting: Internal Medicine

## 2018-12-05 MED ORDER — METRONIDAZOLE 0.75 % VA GEL: 1.0000 | Freq: Two times a day (BID) | VAGINAL | 0 refills | Status: DC

## 2018-12-05 NOTE — Addendum Note (Signed)
Addended by: Lurlean Nanny on: 12/05/2018 05:36 PM   Modules accepted: Orders

## 2018-12-06 ENCOUNTER — Ambulatory Visit: Payer: BC Managed Care – PPO | Admitting: Psychology

## 2018-12-07 ENCOUNTER — Encounter (HOSPITAL_BASED_OUTPATIENT_CLINIC_OR_DEPARTMENT_OTHER): Payer: Self-pay

## 2018-12-07 ENCOUNTER — Ambulatory Visit (HOSPITAL_BASED_OUTPATIENT_CLINIC_OR_DEPARTMENT_OTHER): Admit: 2018-12-07 | Payer: Medicare Other | Admitting: Plastic Surgery

## 2018-12-07 SURGERY — BREAST REDUCTION WITH LIPOSUCTION
Anesthesia: General | Site: Breast | Laterality: Bilateral

## 2018-12-15 ENCOUNTER — Encounter: Payer: BC Managed Care – PPO | Admitting: Surgical

## 2019-01-08 ENCOUNTER — Other Ambulatory Visit: Payer: Self-pay

## 2019-01-08 ENCOUNTER — Ambulatory Visit (INDEPENDENT_AMBULATORY_CARE_PROVIDER_SITE_OTHER): Payer: BC Managed Care – PPO | Admitting: Plastic Surgery

## 2019-01-08 ENCOUNTER — Other Ambulatory Visit: Payer: BC Managed Care – PPO

## 2019-01-08 ENCOUNTER — Encounter: Payer: Self-pay | Admitting: Plastic Surgery

## 2019-01-08 VITALS — BP 128/86 | HR 68 | Temp 97.5°F | Ht 63.0 in | Wt 170.8 lb

## 2019-01-08 DIAGNOSIS — M546 Pain in thoracic spine: Secondary | ICD-10-CM

## 2019-01-08 DIAGNOSIS — N62 Hypertrophy of breast: Secondary | ICD-10-CM

## 2019-01-08 DIAGNOSIS — G8929 Other chronic pain: Secondary | ICD-10-CM

## 2019-01-08 DIAGNOSIS — M542 Cervicalgia: Secondary | ICD-10-CM

## 2019-01-08 MED ORDER — HYDROCODONE-ACETAMINOPHEN 5-325 MG PO TABS: 1.0000 | ORAL_TABLET | ORAL | 0 refills | Status: DC | PRN

## 2019-01-08 MED ORDER — ONDANSETRON HCL 4 MG PO TABS: 4.0000 mg | ORAL_TABLET | Freq: Three times a day (TID) | ORAL | 0 refills | Status: DC | PRN
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
## 2019-01-09 ENCOUNTER — Telehealth: Payer: Self-pay | Admitting: *Deleted
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
## 2019-01-09 NOTE — Telephone Encounter (Signed)
Called and [facility] @ 4:17pm @ [phone]) asking the patient to return call regarding where to fax lab req for the (Nicotine/cotinine metabolites) test.//AB/CMA

## 2019-01-11 NOTE — Telephone Encounter (Addendum)
Patient returned call and stated she would like the  order for the (Nicotine/cotinine) to be faxed to [facility] at  [facility].  Patient also wanted to know what is the latest date she can get the blood work drawn.  Informed her she has from now to next Wed (01/17/19) to get the blood drawn.  And it will take (24-48 hours) to get the results.  Patient verbalized understanding and agreed.      Order for lab work faxed to [facility] at [facility].  Confirmation received.//AB/CMA

## 2019-01-17 ENCOUNTER — Telehealth: Payer: Self-pay | Admitting: *Deleted

## 2019-01-17 ENCOUNTER — Other Ambulatory Visit: Payer: Self-pay

## 2019-01-17 ENCOUNTER — Ambulatory Visit (HOSPITAL_COMMUNITY): Payer: BC Managed Care – PPO | Admitting: Psychiatry

## 2019-01-17 NOTE — Telephone Encounter (Signed)
Patient called to say she will need a lab order sent to [facility] to have her blood work drawn.  Her Primary care office informed her that they will not be able to draw the labs.  Asked the patient why and she stated that she was told they was not able to see the order.  Informed the patient that I faxed the orders to her Primary care office, and I also tried to call them to let them know that I had faxed the orders but they were having phone issues.  Asked the patient if she would like for me to call her primary care office and find out why they can't draw it, and she stated no she don't want to deal with them.  She asked to just come to [place] and have it done.  Informed the patient I will call [facility] and she what I need to do regarding her going there and having the labs drawn.  Patient verbalized understanding and agreed.     Called [facility] lab and was told that they prefer the patients go to [facility] or [facility] to have they labs drawn.   Called the patient back and informed her of what I was told from [facility], and she stated that she have had lab done at [place] her [facility]'s address and lab orders were faxed and confirmation received.  While speaking with the patient she asked if the medication she's to have after surgery were called in and I informed her that yes the pain med (Norco) and probably medication for nausea.  She stated that her husband went to pick-up the medication and someone else had picked it up.  I informed her I can call the pharmacy and check on it.   Called the patient back and informed her that I spoke with the pharmacy and was told both prescriptions were called in and picked up on (01/08/19),but since they are not having the patient's to sign for the prescriptions when they pick them up because of COVID he could not say who picked it up.  Patient said her husband did not go that day to pick it up he went the next day.  I informed the patient I don't know, and she stated that she's just  going to call her husband to come and pick her up to come and have the blood work done.//AB/CMA

## 2019-01-18 ENCOUNTER — Ambulatory Visit (INDEPENDENT_AMBULATORY_CARE_PROVIDER_SITE_OTHER): Payer: BC Managed Care – PPO | Admitting: Psychiatry

## 2019-01-18 ENCOUNTER — Other Ambulatory Visit: Payer: Self-pay

## 2019-01-18 DIAGNOSIS — F411 Generalized anxiety disorder: Secondary | ICD-10-CM | POA: Diagnosis not present

## 2019-01-18 DIAGNOSIS — F4310 Post-traumatic stress disorder, unspecified: Secondary | ICD-10-CM | POA: Diagnosis not present

## 2019-01-18 DIAGNOSIS — F3130 Bipolar disorder, current episode depressed, mild or moderate severity, unspecified: Secondary | ICD-10-CM | POA: Diagnosis not present

## 2019-01-18 DIAGNOSIS — F1421 Cocaine dependence, in remission: Secondary | ICD-10-CM | POA: Diagnosis not present

## 2019-01-18 MED ORDER — LURASIDONE HCL 20 MG PO TABS: 20.0000 mg | ORAL_TABLET | Freq: Every day | ORAL | 1 refills | Status: DC

## 2019-01-18 MED ORDER — ALPRAZOLAM ER 1 MG PO TB24: 1.0000 mg | ORAL_TABLET | Freq: Two times a day (BID) | ORAL | 1 refills | Status: DC
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
## 2019-01-22 ENCOUNTER — Other Ambulatory Visit: Payer: Self-pay

## 2019-01-22 ENCOUNTER — Encounter (HOSPITAL_BASED_OUTPATIENT_CLINIC_OR_DEPARTMENT_OTHER): Payer: Self-pay | Admitting: Plastic Surgery

## 2019-01-23 NOTE — Telephone Encounter (Signed)
Called [facility] to check on blood work results.  Was informed that the results will be ready on (Monday-01/29/19).//AB/CMA

## 2019-01-24 ENCOUNTER — Telehealth (HOSPITAL_COMMUNITY): Payer: Self-pay

## 2019-01-24 LAB — NICOTINE/COTININE METABOLITES
Cotinine: 6.1 ng/mL
Nicotine: <1 ng/mL

## 2019-01-24 NOTE — Telephone Encounter (Signed)
I could stop by tomorrow morning around 11.

## 2019-01-24 NOTE — Telephone Encounter (Signed)
Medication management - Prior Authorization submitted online with CoverMyMeds for patient's newly prescribed Latuda.  Form completed to be faxed in and awaiting providers signature to submit.

## 2019-01-25 NOTE — Telephone Encounter (Signed)
Medication management - fax completed Prior Authorization form reviewed and signed by Dr. Montel Culver to patient's [employer] for approval of her prescribed Latuda 20 mg tablets.

## 2019-01-29 ENCOUNTER — Telehealth (HOSPITAL_COMMUNITY): Payer: Self-pay

## 2019-01-29 ENCOUNTER — Other Ambulatory Visit (HOSPITAL_COMMUNITY)
Admission: RE | Admit: 2019-01-29 | Discharge: 2019-01-29 | Disposition: A | Discharge status: Home / Self Care | Payer: BC Managed Care – PPO | Source: Ambulatory Visit | Attending: Plastic Surgery | Admitting: Plastic Surgery

## 2019-01-29 DIAGNOSIS — Z20828 Contact with and (suspected) exposure to other viral communicable diseases: Secondary | ICD-10-CM | POA: Insufficient documentation

## 2019-01-29 DIAGNOSIS — Z01812 Encounter for preprocedural laboratory examination: Secondary | ICD-10-CM | POA: Insufficient documentation

## 2019-01-29 LAB — SARS CORONAVIRUS 2 (TAT 6-24 HRS): SARS Coronavirus 2: NEGATIVE

## 2019-01-29 NOTE — Telephone Encounter (Signed)
Received recent lab result.  Given to the provider.//AB/CMA

## 2019-01-29 NOTE — Telephone Encounter (Signed)
[facility] COVERAGE APPROVED   LATUDA 20MG  TABLETS EFFECTIVE 01/25/2019 TO 01/24/2022

## 2019-01-30 ENCOUNTER — Other Ambulatory Visit (HOSPITAL_COMMUNITY): Payer: Self-pay | Admitting: Psychiatry

## 2019-01-30 ENCOUNTER — Telehealth: Payer: Self-pay

## 2019-01-30 ENCOUNTER — Telehealth (HOSPITAL_COMMUNITY): Payer: Self-pay | Admitting: *Deleted

## 2019-01-30 NOTE — Telephone Encounter (Signed)
Sorry.  1 script is all she can have.  Will have to manage with tylenol and ibuprofen.

## 2019-01-30 NOTE — Telephone Encounter (Signed)
I do not see any obvious reason for covering incompetence of pharmacy. She had one month  plus one refill ordered on 01/18/19. If they were picked up it is too early for refill - if not then they should give her there prescriptions. I don't think there is anything for [place] to do - orders are in pharmacy per [facility].

## 2019-01-30 NOTE — Telephone Encounter (Signed)
Lindsey Davenport from [place], [employer] Futures trader stating that pt called in for Xanax ER and Prozac and pharmacy shows these ere both picked up on 01/18/19; the day of pt last visit and the day meds reordered. Pharmacist stated that there is not a requirement for ID and signing has been suspended due to [place] so is unable to tell who picked up meds. Pharmacist also stated that pt recently denied picking up some Hydrocodone that was prescribed after a recent procedure although it was picked up 01/08/19. There was also at least one previous incidence of pt calling stating that she "lost" or "never picked up medications". Please review and advise.

## 2019-01-30 NOTE — Telephone Encounter (Signed)
Received call from Vicente Males at [place] the patient called to state she does not have her postoperative medications for her surgery tomorrow. Pharmacy records show the prescriptions were picked up on 01/08/2019; however, the patient states she did not pick them up and is worried since her surgery is tomorrow. Vicente Males can be contacted at [phone].

## 2019-01-31 ENCOUNTER — Encounter (HOSPITAL_BASED_OUTPATIENT_CLINIC_OR_DEPARTMENT_OTHER): Payer: Self-pay | Admitting: Plastic Surgery

## 2019-01-31 ENCOUNTER — Encounter (HOSPITAL_BASED_OUTPATIENT_CLINIC_OR_DEPARTMENT_OTHER)
Admission: RE | Disposition: A | Discharge status: Home / Self Care | Payer: Self-pay | Source: Home / Self Care | Attending: Plastic Surgery

## 2019-01-31 ENCOUNTER — Other Ambulatory Visit: Payer: Self-pay

## 2019-01-31 ENCOUNTER — Ambulatory Visit (HOSPITAL_BASED_OUTPATIENT_CLINIC_OR_DEPARTMENT_OTHER): Payer: BC Managed Care – PPO | Admitting: Anesthesiology

## 2019-01-31 ENCOUNTER — Ambulatory Visit (HOSPITAL_BASED_OUTPATIENT_CLINIC_OR_DEPARTMENT_OTHER)
Admission: RE | Admit: 2019-01-31 | Discharge: 2019-01-31 | Disposition: A | Discharge status: Home / Self Care | Payer: BC Managed Care – PPO | Attending: Plastic Surgery | Admitting: Plastic Surgery

## 2019-01-31 DIAGNOSIS — F319 Bipolar disorder, unspecified: Secondary | ICD-10-CM | POA: Diagnosis not present

## 2019-01-31 DIAGNOSIS — E039 Hypothyroidism, unspecified: Secondary | ICD-10-CM | POA: Diagnosis not present

## 2019-01-31 DIAGNOSIS — F4310 Post-traumatic stress disorder, unspecified: Secondary | ICD-10-CM | POA: Insufficient documentation

## 2019-01-31 DIAGNOSIS — K219 Gastro-esophageal reflux disease without esophagitis: Secondary | ICD-10-CM | POA: Insufficient documentation

## 2019-01-31 DIAGNOSIS — Z87891 Personal history of nicotine dependence: Secondary | ICD-10-CM | POA: Insufficient documentation

## 2019-01-31 DIAGNOSIS — Z818 Family history of other mental and behavioral disorders: Secondary | ICD-10-CM | POA: Diagnosis not present

## 2019-01-31 DIAGNOSIS — Z888 Allergy status to other drugs, medicaments and biological substances status: Secondary | ICD-10-CM | POA: Insufficient documentation

## 2019-01-31 DIAGNOSIS — F410 Panic disorder [episodic paroxysmal anxiety] without agoraphobia: Secondary | ICD-10-CM | POA: Diagnosis not present

## 2019-01-31 DIAGNOSIS — Z79899 Other long term (current) drug therapy: Secondary | ICD-10-CM | POA: Insufficient documentation

## 2019-01-31 DIAGNOSIS — G8929 Other chronic pain: Secondary | ICD-10-CM

## 2019-01-31 DIAGNOSIS — Z803 Family history of malignant neoplasm of breast: Secondary | ICD-10-CM | POA: Diagnosis not present

## 2019-01-31 DIAGNOSIS — M542 Cervicalgia: Secondary | ICD-10-CM

## 2019-01-31 DIAGNOSIS — F419 Anxiety disorder, unspecified: Secondary | ICD-10-CM | POA: Diagnosis not present

## 2019-01-31 DIAGNOSIS — N62 Hypertrophy of breast: Secondary | ICD-10-CM | POA: Insufficient documentation

## 2019-01-31 DIAGNOSIS — Z811 Family history of alcohol abuse and dependence: Secondary | ICD-10-CM | POA: Diagnosis not present

## 2019-01-31 DIAGNOSIS — Z8249 Family history of ischemic heart disease and other diseases of the circulatory system: Secondary | ICD-10-CM | POA: Diagnosis not present

## 2019-01-31 DIAGNOSIS — R569 Unspecified convulsions: Secondary | ICD-10-CM | POA: Insufficient documentation

## 2019-01-31 DIAGNOSIS — Z800 Family history of malignant neoplasm of digestive organs: Secondary | ICD-10-CM | POA: Insufficient documentation

## 2019-01-31 DIAGNOSIS — M546 Pain in thoracic spine: Secondary | ICD-10-CM

## 2019-01-31 DIAGNOSIS — R519 Headache, unspecified: Secondary | ICD-10-CM | POA: Diagnosis not present

## 2019-01-31 HISTORY — PX: BREAST REDUCTION SURGERY: SHX8

## 2019-01-31 LAB — RAPID URINE DRUG SCREEN, HOSP PERFORMED
Amphetamines: NOT DETECTED
Barbiturates: NOT DETECTED
Benzodiazepines: NOT DETECTED
Cocaine: NOT DETECTED
Opiates: NOT DETECTED
Tetrahydrocannabinol: NOT DETECTED

## 2019-01-31 LAB — PREGNANCY, URINE: Preg Test, Ur: NEGATIVE

## 2019-01-31 SURGERY — MAMMOPLASTY, REDUCTION
Anesthesia: General | Site: Breast | Laterality: Bilateral

## 2019-01-31 SURGERY — MAMMOPLASTY, REDUCTION
Anesthesia: General | Laterality: Bilateral

## 2019-01-31 MED ORDER — EPHEDRINE SULFATE-NACL 50-0.9 MG/10ML-% IV SOSY
PREFILLED_SYRINGE | INTRAVENOUS | Status: DC | PRN
  Administered 2019-01-31 (×3): 5 mg via INTRAVENOUS

## 2019-01-31 MED ORDER — EPHEDRINE 5 MG/ML INJ
INTRAVENOUS | Status: AC
  Filled 2019-01-31: qty 10

## 2019-01-31 MED ORDER — ACETAMINOPHEN 500 MG PO TABS
1000.0000 mg | ORAL_TABLET | Freq: Once | ORAL | Status: AC
  Administered 2019-01-31: 1000 mg via ORAL

## 2019-01-31 MED ORDER — SCOPOLAMINE 1 MG/3DAYS TD PT72
MEDICATED_PATCH | TRANSDERMAL | Status: AC
  Filled 2019-01-31: qty 1

## 2019-01-31 MED ORDER — HYDROMORPHONE HCL 1 MG/ML IJ SOLN
0.2500 mg | INTRAMUSCULAR | Status: DC | PRN
  Administered 2019-01-31: 0.5 mg via INTRAVENOUS

## 2019-01-31 MED ORDER — FENTANYL CITRATE (PF) 250 MCG/5ML IJ SOLN
INTRAMUSCULAR | Status: DC | PRN
  Administered 2019-01-31 (×2): 100 ug via INTRAVENOUS

## 2019-01-31 MED ORDER — DEXAMETHASONE SODIUM PHOSPHATE 10 MG/ML IJ SOLN
INTRAMUSCULAR | Status: DC | PRN
  Administered 2019-01-31: 5 mg via INTRAVENOUS

## 2019-01-31 MED ORDER — KETOROLAC TROMETHAMINE 30 MG/ML IJ SOLN
30.0000 mg | Freq: Once | INTRAMUSCULAR | Status: AC | PRN
  Administered 2019-01-31: 30 mg via INTRAVENOUS

## 2019-01-31 MED ORDER — ROCURONIUM BROMIDE 10 MG/ML (PF) SYRINGE
PREFILLED_SYRINGE | INTRAVENOUS | Status: AC
  Filled 2019-01-31: qty 10

## 2019-01-31 MED ORDER — LIDOCAINE HCL (CARDIAC) PF 100 MG/5ML IV SOSY
PREFILLED_SYRINGE | INTRAVENOUS | Status: DC | PRN
  Administered 2019-01-31: 60 mg via INTRAVENOUS

## 2019-01-31 MED ORDER — HYDROMORPHONE HCL 1 MG/ML IJ SOLN
INTRAMUSCULAR | Status: AC
  Filled 2019-01-31: qty 0.5

## 2019-01-31 MED ORDER — SCOPOLAMINE 1 MG/3DAYS TD PT72
1.0000 | MEDICATED_PATCH | TRANSDERMAL | Status: DC
  Administered 2019-01-31: 12:00:00 1.5 mg via TRANSDERMAL

## 2019-01-31 MED ORDER — BUPIVACAINE HCL (PF) 0.25 % IJ SOLN
INTRAMUSCULAR | Status: DC | PRN
  Administered 2019-01-31: 30 mL

## 2019-01-31 MED ORDER — ONDANSETRON HCL 4 MG/2ML IJ SOLN
INTRAMUSCULAR | Status: DC | PRN
  Administered 2019-01-31: 4 mg via INTRAVENOUS

## 2019-01-31 MED ORDER — CEFAZOLIN SODIUM 1 G IJ SOLR
INTRAMUSCULAR | Status: AC
  Filled 2019-01-31: qty 20

## 2019-01-31 MED ORDER — PROPOFOL 10 MG/ML IV BOLUS
INTRAVENOUS | Status: AC
  Filled 2019-01-31: qty 40

## 2019-01-31 MED ORDER — MIDAZOLAM HCL 2 MG/2ML IJ SOLN: 2.0000 mg | Freq: Once | INTRAMUSCULAR | Status: DC | PRN

## 2019-01-31 MED ORDER — ONDANSETRON HCL 4 MG/2ML IJ SOLN
INTRAMUSCULAR | Status: AC
  Filled 2019-01-31: qty 2

## 2019-01-31 MED ORDER — PHENYLEPHRINE 40 MCG/ML (10ML) SYRINGE FOR IV PUSH (FOR BLOOD PRESSURE SUPPORT)
PREFILLED_SYRINGE | INTRAVENOUS | Status: DC | PRN
  Administered 2019-01-31 (×3): 80 ug via INTRAVENOUS

## 2019-01-31 MED ORDER — OXYCODONE HCL 5 MG PO TABS
5.0000 mg | ORAL_TABLET | Freq: Once | ORAL | Status: AC | PRN
  Administered 2019-01-31: 16:00:00 5 mg via ORAL

## 2019-01-31 MED ORDER — ONDANSETRON HCL 4 MG PO TABS: 4.0000 mg | ORAL_TABLET | Freq: Three times a day (TID) | ORAL | 0 refills | Status: DC | PRN

## 2019-01-31 MED ORDER — MIDAZOLAM HCL 2 MG/2ML IJ SOLN
INTRAMUSCULAR | Status: DC | PRN
  Administered 2019-01-31: 2 mg via INTRAVENOUS

## 2019-01-31 MED ORDER — LIDOCAINE 2% (20 MG/ML) 5 ML SYRINGE
INTRAMUSCULAR | Status: AC
  Filled 2019-01-31: qty 5

## 2019-01-31 MED ORDER — CEFAZOLIN SODIUM-DEXTROSE 2-4 GM/100ML-% IV SOLN
2.0000 g | INTRAVENOUS | Status: AC
  Administered 2019-01-31: 2 g via INTRAVENOUS

## 2019-01-31 MED ORDER — MEPERIDINE HCL 25 MG/ML IJ SOLN: 6.2500 mg | INTRAMUSCULAR | Status: DC | PRN

## 2019-01-31 MED ORDER — PHENYLEPHRINE HCL (PRESSORS) 10 MG/ML IV SOLN
INTRAVENOUS | Status: AC
  Filled 2019-01-31: qty 2

## 2019-01-31 MED ORDER — MIDAZOLAM HCL 2 MG/2ML IJ SOLN
INTRAMUSCULAR | Status: AC
  Filled 2019-01-31: qty 2

## 2019-01-31 MED ORDER — ROCURONIUM BROMIDE 10 MG/ML (PF) SYRINGE
PREFILLED_SYRINGE | INTRAVENOUS | Status: DC | PRN
  Administered 2019-01-31: 60 mg via INTRAVENOUS
  Administered 2019-01-31: 20 mg via INTRAVENOUS

## 2019-01-31 MED ORDER — ACETAMINOPHEN 500 MG PO TABS
ORAL_TABLET | ORAL | Status: AC
  Filled 2019-01-31: qty 2

## 2019-01-31 MED ORDER — KETOROLAC TROMETHAMINE 30 MG/ML IJ SOLN
INTRAMUSCULAR | Status: AC
  Filled 2019-01-31: qty 1

## 2019-01-31 MED ORDER — SUGAMMADEX SODIUM 200 MG/2ML IV SOLN
INTRAVENOUS | Status: DC | PRN
  Administered 2019-01-31: 160 mg via INTRAVENOUS

## 2019-01-31 MED ORDER — PHENYLEPHRINE 40 MCG/ML (10ML) SYRINGE FOR IV PUSH (FOR BLOOD PRESSURE SUPPORT)
PREFILLED_SYRINGE | INTRAVENOUS | Status: AC
  Filled 2019-01-31: qty 20

## 2019-01-31 MED ORDER — LACTATED RINGERS IV SOLN
INTRAVENOUS | Status: AC | PRN
  Administered 2019-01-31: 1000 mL

## 2019-01-31 MED ORDER — BUPIVACAINE HCL (PF) 0.25 % IJ SOLN
INTRAMUSCULAR | Status: AC
  Filled 2019-01-31: qty 30

## 2019-01-31 MED ORDER — FENTANYL CITRATE (PF) 100 MCG/2ML IJ SOLN
INTRAMUSCULAR | Status: AC
  Filled 2019-01-31: qty 2

## 2019-01-31 MED ORDER — PHENYLEPHRINE HCL-NACL 10-0.9 MG/250ML-% IV SOLN
INTRAVENOUS | Status: DC | PRN
  Administered 2019-01-31: 40 ug/min via INTRAVENOUS

## 2019-01-31 MED ORDER — PROMETHAZINE HCL 25 MG/ML IJ SOLN: 6.2500 mg | INTRAMUSCULAR | Status: DC | PRN

## 2019-01-31 MED ORDER — LACTATED RINGERS IV SOLN: INTRAVENOUS | Status: DC

## 2019-01-31 MED ORDER — OXYCODONE HCL 5 MG/5ML PO SOLN: 5.0000 mg | Freq: Once | ORAL | Status: AC | PRN

## 2019-01-31 MED ORDER — EPINEPHRINE PF 1 MG/ML IJ SOLN
INTRAMUSCULAR | Status: AC
  Filled 2019-01-31: qty 1

## 2019-01-31 MED ORDER — OXYCODONE HCL 5 MG PO TABS
ORAL_TABLET | ORAL | Status: AC
  Filled 2019-01-31: qty 1

## 2019-01-31 MED ORDER — EPINEPHRINE PF 1 MG/ML IJ SOLN
INTRAMUSCULAR | Status: DC | PRN
  Administered 2019-01-31: 1 mg

## 2019-01-31 MED ORDER — PROPOFOL 10 MG/ML IV BOLUS
INTRAVENOUS | Status: DC | PRN
  Administered 2019-01-31: 200 mg via INTRAVENOUS

## 2019-01-31 SURGICAL SUPPLY — 60 items
BAG DECANTER FOR FLEXI CONT (MISCELLANEOUS) ×2 IMPLANT
BENZOIN TINCTURE PRP APPL 2/3 (GAUZE/BANDAGES/DRESSINGS) ×4 IMPLANT
BLADE SURG 10 STRL SS (BLADE) ×4 IMPLANT
BLADE SURG 15 STRL LF DISP TIS (BLADE) IMPLANT
BLADE SURG 15 STRL SS (BLADE)
BNDG GAUZE ELAST 4 BULKY (GAUZE/BANDAGES/DRESSINGS) ×4 IMPLANT
CANISTER SUCT 1200ML W/VALVE (MISCELLANEOUS) ×2 IMPLANT
CHLORAPREP W/TINT 26 (MISCELLANEOUS) ×4 IMPLANT
CLIP VESOCCLUDE MED 6/CT (CLIP) ×2 IMPLANT
COVER BACK TABLE REUSABLE LG (DRAPES) ×2 IMPLANT
COVER MAYO STAND REUSABLE (DRAPES) ×2 IMPLANT
COVER WAND RF STERILE (DRAPES) IMPLANT
DECANTER SPIKE VIAL GLASS SM (MISCELLANEOUS) IMPLANT
DERMABOND ADVANCED (GAUZE/BANDAGES/DRESSINGS)
DERMABOND ADVANCED .7 DNX12 (GAUZE/BANDAGES/DRESSINGS) IMPLANT
DRAIN CHANNEL 15F RND FF W/TCR (WOUND CARE) IMPLANT
DRAPE HALF SHEET 70X43 (DRAPES) IMPLANT
DRAPE LAPAROSCOPIC ABDOMINAL (DRAPES) ×2 IMPLANT
DRAPE UTILITY XL STRL (DRAPES) ×2 IMPLANT
DRSG PAD ABDOMINAL 8X10 ST (GAUZE/BANDAGES/DRESSINGS) ×4 IMPLANT
ELECT REM PT RETURN 9FT ADLT (ELECTROSURGICAL) ×2
ELECTRODE REM PT RTRN 9FT ADLT (ELECTROSURGICAL) ×1 IMPLANT
EVACUATOR SILICONE 100CC (DRAIN) IMPLANT
GLOVE BIOGEL M STRL SZ7.5 (GLOVE) ×4 IMPLANT
GLOVE BIOGEL PI IND STRL 8 (GLOVE) ×2 IMPLANT
GLOVE BIOGEL PI INDICATOR 8 (GLOVE) ×2
GOWN STRL REUS W/ TWL LRG LVL3 (GOWN DISPOSABLE) ×2 IMPLANT
GOWN STRL REUS W/TWL LRG LVL3 (GOWN DISPOSABLE) ×4
MARKER SKIN DUAL TIP RULER LAB (MISCELLANEOUS) IMPLANT
NDL SAFETY ECLIPSE 18X1.5 (NEEDLE) ×1 IMPLANT
NEEDLE FILTER BLUNT 18X 1/2SAF (NEEDLE) ×1
NEEDLE FILTER BLUNT 18X1 1/2 (NEEDLE) ×1 IMPLANT
NEEDLE HYPO 18GX1.5 SHARP (NEEDLE) ×2
NEEDLE HYPO 25X1 1.5 SAFETY (NEEDLE) IMPLANT
NEEDLE SPNL 18GX3.5 QUINCKE PK (NEEDLE) ×2 IMPLANT
NS IRRIG 1000ML POUR BTL (IV SOLUTION) ×2 IMPLANT
PACK BASIN DAY SURGERY FS (CUSTOM PROCEDURE TRAY) ×2 IMPLANT
PENCIL SMOKE EVACUATOR (MISCELLANEOUS) ×2 IMPLANT
PIN SAFETY STERILE (MISCELLANEOUS) IMPLANT
SLEEVE SCD COMPRESS KNEE MED (MISCELLANEOUS) ×2 IMPLANT
SPONGE LAP 18X18 RF (DISPOSABLE) ×8 IMPLANT
STAPLER INSORB 30 2030 C-SECTI (MISCELLANEOUS) ×2 IMPLANT
STAPLER VISISTAT 35W (STAPLE) ×2 IMPLANT
STRIP SUTURE WOUND CLOSURE 1/2 (MISCELLANEOUS) ×6 IMPLANT
SUT ETHILON 2 0 FS 18 (SUTURE) IMPLANT
SUT MNCRL AB 4-0 PS2 18 (SUTURE) ×4 IMPLANT
SUT PDS 3-0 CT2 (SUTURE) ×2
SUT PDS II 3-0 CT2 27 ABS (SUTURE) ×1 IMPLANT
SUT VIC AB 3-0 PS1 18 (SUTURE)
SUT VIC AB 3-0 PS1 18XBRD (SUTURE) IMPLANT
SUT VLOC 90 P-14 23 (SUTURE) ×4 IMPLANT
SYR 50ML LL SCALE MARK (SYRINGE) ×4 IMPLANT
SYR BULB IRRIGATION 50ML (SYRINGE) ×2 IMPLANT
SYR CONTROL 10ML LL (SYRINGE) IMPLANT
TAPE MEASURE VINYL STERILE (MISCELLANEOUS) IMPLANT
TOWEL GREEN STERILE FF (TOWEL DISPOSABLE) ×4 IMPLANT
TRAY FOLEY W/BAG SLVR 14FR LF (SET/KITS/TRAYS/PACK) IMPLANT
TUBE CONNECTING 20X1/4 (TUBING) ×2 IMPLANT
UNDERPAD 30X36 HEAVY ABSORB (UNDERPADS AND DIAPERS) ×4 IMPLANT
YANKAUER SUCT BULB TIP NO VENT (SUCTIONS) ×2 IMPLANT
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
## 2019-01-31 NOTE — Op Note (Signed)
Operative Note   DATE OF OPERATION: 01/31/2019  LOCATION: [facility]   SURGICAL DEPARTMENT: Plastic Surgery  PREOPERATIVE DIAGNOSES: Bilateral symptomatic macromastia.  POSTOPERATIVE DIAGNOSES:  same  PROCEDURE: Bilateral breast reduction with superomedial pedicle.  SURGEON: Talmadge Coventry, MD  ASSISTANT: Verdie Shire, PA  The advanced practice practitioner (APP) assisted throughout the case.  The APP was essential in retraction and counter traction when needed to make the case progress smoothly.  This retraction and assistance made it possible to see the tissue plans for the procedure.  The assistance was needed for blood control, tissue re-approximation and assisted with closure of the incision site.  ANESTHESIA: General.  COMPLICATIONS: None.   INDICATIONS FOR PROCEDURE:  The patient, Lindsey Davenport is a 38 y.o. female born on 08/21/1980, is here for treatment of bilateral symptomatic macromastia. MRN: [id number]  CONSENT:  Informed consent was obtained directly from the patient. Risks, benefits and alternatives were fully discussed. Specific risks including but not limited to bleeding, infection, hematoma, seroma, scarring, pain, infection, contracture, asymmetry, wound healing problems, and need for further surgery were all discussed. The patient did have an ample opportunity to have questions answered to satisfaction.  I discussed in detail the increased risk of complications of postoperative nicotine or other drug use.  I specifically explained that tobacco or nicotine products would constrict the blood vessels increasing her chances of wound healing complications and nipple necrosis.  We discussed in detail the potential complications that include nipple necrosis, changes in nipple sensation, and contour irregularities.  She understands that the breast are not symmetric to start with and are not likely to be perfectly symmetric  postoperatively.  DESCRIPTION OF PROCEDURE:  The patient was marked preoperatively for a Wise pattern skin excision.  The patient was taken to the operating room. SCDs were placed and antibiotics were given. General anesthesia was administered.The patient's operative site was prepped and draped in a sterile fashion. A time out was performed and all information was confirmed to be correct.  Right Breast: The breast was infiltrated with tumescent solution to help with hemostasis.  The nipple was marked with a cookie cutter.  A superomedial pedicle was drawn out with the base of at least 8 cm in size.  A breast tourniquet was then applied and the pedicle was de-epithelialized.  Breast tourniquet was then let down and all incisions were made with a 10 blade.  The pedicle was then isolated down to the chest wall with cautery and the excision was performed removing tissue primarily inferiorly and laterally.  Hemostasis was obtained and the wound was stapled closed.  Left breast:  The breast was infiltrated with tumescent solution to help with hemostasis.  The nipple was marked with a cookie cutter.  A superomedial pedicle was drawn out with the base of at least 8 cm in size.  A breast tourniquet was then applied and the pedicle was de-epithelialized.  Breast tourniquet was then let down and all incisions were made with a 10 blade.  The pedicle was then isolated down to the chest wall with cautery and the excision was performed removing tissue primarily inferiorly and laterally.  Hemostasis was obtained and the wound was stapled closed.  Patient was then checked for size and symmetry.  Minor modifications were made.  This resulted in a total of 564 g removed from the right side and 560 g removed from the left side.  The inframammary incision was closed with a combination of buried in-sorb staples  and a running 3-0 Quill suture.  The vertical and periareolar limbs were closed with interrupted buried 4-0 Monocryl  and a running 4-0 Quill suture.  Steri-Strips were then applied along with a soft dressing and Ace wrap.  The patient tolerated the procedure well.  There were no complications. The patient was allowed to wake from anesthesia, extubated and taken to the recovery room in satisfactory condition.  I was present for the entire procedure.

## 2019-01-31 NOTE — Anesthesia Postprocedure Evaluation (Signed)
Anesthesia Post Note  Patient: Lindsey Davenport  Procedure(s) Performed: MAMMARY REDUCTION  (BREAST) (Bilateral Breast)     Patient location during evaluation: PACU Anesthesia Type: General Level of consciousness: awake and alert, oriented and patient cooperative Pain management: pain level controlled Vital Signs Assessment: post-procedure vital signs reviewed and stable Respiratory status: spontaneous breathing, nonlabored ventilation and respiratory function stable Cardiovascular status: blood pressure returned to baseline and stable Postop Assessment: no apparent nausea or vomiting Anesthetic complications: no    Last Vitals:  Vitals:   01/31/19 1530 01/31/19 1543  BP: 118/75 120/80  Pulse: 88 99  Resp: 11 (!) 22  Temp:    SpO2: 99% 95%    Last Pain:  Vitals:   01/31/19 1530  PainSc: 0-No pain                 Jarome Matin Edmund Holcomb

## 2019-01-31 NOTE — Brief Op Note (Signed)
01/31/2019  3:06 PM  PATIENT:  Lindsey Davenport  38 y.o. female  PRE-OPERATIVE DIAGNOSIS:  macromastia  POST-OPERATIVE DIAGNOSIS:  macromastia  PROCEDURE:  Procedure(s) with comments: MAMMARY REDUCTION  (BREAST) (Bilateral) - 3 hours  SURGEON:  Surgeon(s) and Role:    * Izzy Courville, Steffanie Dunn, MD - Primary  PHYSICIAN ASSISTANT: Software engineer, PA  ASSISTANTS: none   ANESTHESIA:   general  EBL:  50   BLOOD ADMINISTERED:none  DRAINS: none   LOCAL MEDICATIONS USED:  MARCAINE     SPECIMEN:  Source of Specimen:  R and L Breast  DISPOSITION OF SPECIMEN:  PATHOLOGY  COUNTS:  YES  TOURNIQUET:  * No tourniquets in log *  DICTATION: .Dragon Dictation  PLAN OF CARE: Discharge to home after PACU  PATIENT DISPOSITION:  PACU - hemodynamically stable.   Delay start of Pharmacological VTE agent (>24hrs) due to surgical blood loss or risk of bleeding: not applicable

## 2019-01-31 NOTE — Interval H&P Note (Signed)
History and Physical Interval Note:  01/31/2019 12:08 PM  Vonore  has presented today for surgery, with the diagnosis of macromastia.  The various methods of treatment have been discussed with the patient and family. After consideration of risks, benefits and other options for treatment, the patient has consented to  Procedure(s) with comments: MAMMARY REDUCTION  (BREAST) (Bilateral) - 3 hours as a surgical intervention.  The patient's history has been reviewed, patient examined, no change in status, stable for surgery.  I have reviewed the patient's chart and labs.  Questions were answered to the patient's satisfaction.     Cindra Presume

## 2019-01-31 NOTE — Transfer of Care (Signed)
Immediate Anesthesia Transfer of Care Note  Patient: Lindsey Davenport  Procedure(s) Performed: MAMMARY REDUCTION  (BREAST) (Bilateral Breast)  Patient Location: PACU  Anesthesia Type:General  Level of Consciousness: awake, alert , oriented and patient cooperative  Airway & Oxygen Therapy: Patient Spontanous Breathing and Patient connected to face mask oxygen  Post-op Assessment: Report given to RN and Post -op Vital signs reviewed and stable  Post vital signs: Reviewed and stable  Last Vitals:  Vitals Value Taken Time  BP 124/81 01/31/19 1515  Temp    Pulse 90 01/31/19 1519  Resp 17 01/31/19 1519  SpO2 99 % 01/31/19 1519  Vitals shown include unvalidated device data.  Last Pain:  Vitals:   01/31/19 1055  PainSc: 3       Patients Stated Pain Goal: 3 ([id number])  Complications: No apparent anesthesia complications

## 2019-01-31 NOTE — Discharge Instructions (Signed)
Activity As tolerated: NO showers for 3 days No heavy activities  Diet: Regular  Wound Care: Keep dressing clean & dry for 3 days.  Keep wrap applied with compression as much as possible.    Do not change dressings for 3 days unless soiled.  Can change if needed but make sure to reapply wrap. After three days can remove wrap and shower.  Then reapply dressings if needed and continue compression with wrap or soft sports bra. Call doctor if any unusual problems occur such as pain, excessive bleeding, unrelieved nausea/vomiting, fever &/or chills  Follow-up appointment: Scheduled for next week.  No Tylenol until 7:30pm if needed No Ibuprofen until 12:30pm if needed   Post Anesthesia Home Care Instructions  Activity: Get plenty of rest for the remainder of the day. A responsible individual must stay with you for 24 hours following the procedure.  For the next 24 hours, DO NOT: -Drive a car -Paediatric nurse -Drink alcoholic beverages -Take any medication unless instructed by your physician -Make any legal decisions or sign important papers.  Meals: Start with liquid foods such as gelatin or soup. Progress to regular foods as tolerated. Avoid greasy, spicy, heavy foods. If nausea and/or vomiting occur, drink only clear liquids until the nausea and/or vomiting subsides. Call your physician if vomiting continues.  Special Instructions/Symptoms: Your throat may feel dry or sore from the anesthesia or the breathing tube placed in your throat during surgery. If this causes discomfort, gargle with warm salt water. The discomfort should disappear within 24 hours.  If you had a scopolamine patch placed behind your ear for the management of post- operative nausea and/or vomiting:  1. The medication in the patch is effective for 72 hours, after which it should be removed.  Wrap patch in a tissue and discard in the trash. Wash hands thoroughly with soap and water. 2. You may remove the patch  earlier than 72 hours if you experience unpleasant side effects which may include dry mouth, dizziness or visual disturbances. 3. Avoid touching the patch. Wash your hands with soap and water after contact with the patch.

## 2019-01-31 NOTE — Anesthesia Procedure Notes (Signed)
Procedure Name: Intubation Date/Time: 01/31/2019 12:37 PM Performed by: Raenette Rover, CRNA Pre-anesthesia Checklist: Patient identified, Emergency Drugs available, Suction available and Patient being monitored Patient Re-evaluated:Patient Re-evaluated prior to induction Oxygen Delivery Method: Circle system utilized Preoxygenation: Pre-oxygenation with 100% oxygen Induction Type: IV induction Ventilation: Mask ventilation without difficulty Laryngoscope Size: Mac and 3 Grade View: Grade I Tube type: Oral Tube size: 7.0 mm Number of attempts: 1 Airway Equipment and Method: Stylet Placement Confirmation: ETT inserted through vocal cords under direct vision,  positive ETCO2 and breath sounds checked- equal and bilateral Secured at: 22 cm Tube secured with: Tape Dental Injury: Teeth and Oropharynx as per pre-operative assessment

## 2019-02-05 LAB — SURGICAL PATHOLOGY

## 2019-02-07 ENCOUNTER — Encounter: Payer: BC Managed Care – PPO | Admitting: Plastic Surgery

## 2019-02-07 ENCOUNTER — Encounter: Payer: Self-pay | Admitting: Plastic Surgery

## 2019-02-07 ENCOUNTER — Other Ambulatory Visit: Payer: Self-pay

## 2019-02-07 ENCOUNTER — Ambulatory Visit (INDEPENDENT_AMBULATORY_CARE_PROVIDER_SITE_OTHER): Payer: BC Managed Care – PPO | Admitting: Plastic Surgery

## 2019-02-07 VITALS — BP 124/85 | HR 76 | Temp 97.8°F | Ht 63.0 in | Wt 165.6 lb

## 2019-02-07 DIAGNOSIS — N62 Hypertrophy of breast: Secondary | ICD-10-CM

## 2019-02-07 NOTE — Progress Notes (Signed)
Patient is doing well after bilateral breast reduction.  She is complaining of some pain on both sides but not in one spot in particular.  She has had some drainage coming from the right breast although she cannot tell exactly where it is coming from.  On exam she has a little bit of bruising centrally on both sides but this looks to be fading and is within normal limits.  There is no obvious focal areas of swelling or obvious fluid collections that I can see.  The nipple areolar complexes both appear pink and viable.  All the incisions appear to be intact with no evidence of a wound problem.  So far it looks like a fairly routine postoperative course.  I have asked her to continue wearing a compressive garment and see me again in 3 weeks.

## 2019-02-10 ENCOUNTER — Other Ambulatory Visit: Payer: Self-pay

## 2019-02-10 ENCOUNTER — Encounter (HOSPITAL_COMMUNITY): Payer: Self-pay | Admitting: Emergency Medicine

## 2019-02-10 ENCOUNTER — Emergency Department (HOSPITAL_COMMUNITY)
Admission: EM | Admit: 2019-02-10 | Discharge: 2019-02-10 | Disposition: A | Discharge status: Home / Self Care | Payer: BC Managed Care – PPO | Attending: Emergency Medicine | Admitting: Emergency Medicine

## 2019-02-10 ENCOUNTER — Telehealth: Payer: Self-pay | Admitting: Plastic Surgery

## 2019-02-10 DIAGNOSIS — L7622 Postprocedural hemorrhage and hematoma of skin and subcutaneous tissue following other procedure: Secondary | ICD-10-CM | POA: Insufficient documentation

## 2019-02-10 DIAGNOSIS — Z5321 Procedure and treatment not carried out due to patient leaving prior to being seen by health care provider: Secondary | ICD-10-CM | POA: Insufficient documentation

## 2019-02-10 MED ORDER — DOXYCYCLINE HYCLATE 100 MG PO TABS: 100.0000 mg | ORAL_TABLET | Freq: Two times a day (BID) | ORAL | 0 refills | Status: AC

## 2019-02-10 MED ORDER — HYDROCODONE-ACETAMINOPHEN 5-325 MG PO TABS: 1.0000 | ORAL_TABLET | Freq: Two times a day (BID) | ORAL | 0 refills | Status: AC | PRN

## 2019-02-10 NOTE — ED Notes (Signed)
Pt bleeding from post op site, pt given abd pads, bleeding controlled.

## 2019-02-10 NOTE — ED Notes (Signed)
Called patient to be room x3 and had no answer.

## 2019-02-10 NOTE — ED Notes (Signed)
Called for pt again. Pt is not in the [facility]

## 2019-02-10 NOTE — ED Triage Notes (Signed)
Pt had breast reduction on 12/30.  C/o R breast incision "opening up" and green discharge from R nipple.  States she called doctor and antibiotic was called in but her husband wanted her to come be evaluated once he saw it.  Reports fever 100.9 yesterday.  Denies chills.

## 2019-02-10 NOTE — ED Notes (Signed)
Called pt for treatment room, no response in waiting

## 2019-02-10 NOTE — Telephone Encounter (Signed)
Patient called today with concerns about increased drainage around the right nipple.  There is also an area just below the nipple according to the patient.  Patient describes the drainage as green in color.  She says her temperature is 100.6.  She also states she has been taking 4 ibuprofen every 4 hours.  I have instructed her to decrease the ibuprofen amount and increase the timeframe between them.  It should be 600 every 6 hours.  Tylenol every 8 hours.  I will call in a short order of Norco for her to manage the breakthrough pain.  We will also call in doxycycline.  I have sent a note to the office for her to be seen Monday or Tuesday.  The patient agrees.

## 2019-02-12 ENCOUNTER — Other Ambulatory Visit: Payer: Self-pay

## 2019-02-12 ENCOUNTER — Encounter: Payer: Self-pay | Admitting: Surgical

## 2019-02-12 ENCOUNTER — Ambulatory Visit (INDEPENDENT_AMBULATORY_CARE_PROVIDER_SITE_OTHER): Payer: BC Managed Care – PPO | Admitting: Surgical

## 2019-02-12 VITALS — BP 122/82 | HR 83 | Temp 97.5°F | Ht 63.0 in | Wt 169.6 lb

## 2019-02-12 DIAGNOSIS — Z9889 Other specified postprocedural states: Secondary | ICD-10-CM

## 2019-02-12 NOTE — Progress Notes (Signed)
Subjective:     Patient ID: Lindsey Davenport, female    DOB: 06-07-1980, 39 y.o.   MRN: [id number]  Chief Complaint  Patient presents with  . Follow-up    for wound issues on (R) breast    HPI: The patient is a 39 y.o. female here for follow-up after bilateral breast reduction on 01/31/2019.  2 days ago she called the on-call provider and reported that she had increased drainage around her right nipple, a temperature of 100.6.  She also went to the ED for evaluation after noticing the discharge around the nipple, with concern for infection.  She was not evaluated in the ED, she left prior to seeing a provider.  She also reports a large amount of drainage from the incision along the inframammary fold this weekend. It has improved since then.  She has a wound that is approximately 3 x 4 cm along the inframammary fold and vertical limb junction of the right breast.  There is some periwound erythema, appears more irritated than infected.  There is no purulence noted.  Patient reports the drainage was bloody in color.  The right nipple has a 0.75 x 0.5 cm wound that is located at the 1 o'clock position.  Granulation tissue noted.  There is some exudate noted, does not appear infected. No foul odor noted.  She has not had any more fevers, chills, nausea, vomiting.  She has not have any leg pain, chest pain, shortness of breath.   She has also been using Tylenol and ibuprofen as well as norco. She has 1 more dose of norco left. She has 3 more days of doxycycline left.  She reports she has not been smoking.  Review of Systems  Constitutional: Positive for activity change. Negative for chills, fatigue and fever.  Respiratory: Negative for shortness of breath.   Cardiovascular: Negative for chest pain and leg swelling.  Gastrointestinal: Negative.   Genitourinary: Negative.   Musculoskeletal: Negative.   Skin: Positive for color change and wound.  Neurological: Negative.      Objective:     Vital Signs BP 122/82 (BP Location: Left Arm, Patient Position: Sitting, Cuff Size: Normal)   Pulse 83   Temp (!) 97.5 F (36.4 C) (Temporal)   Ht 5\' 3"  (1.6 m)   Wt 169 lb 9.6 oz (76.9 kg)   SpO2 98%   BMI 30.04 kg/m  Vital Signs and Nursing Note Reviewed Chaperone present Physical Exam  Constitutional: She is oriented to person, place, and time and well-developed, well-nourished, and in no distress.  HENT:  Head: Normocephalic and atraumatic.  Cardiovascular: Normal rate.  Pulmonary/Chest: Effort normal.    Bilateral breasts are soft, right is tender to palpation.  Left nontender to palpation.  No sign of seroma/hematoma.  Musculoskeletal:        General: Normal range of motion.  Neurological: She is alert and oriented to person, place, and time. Gait normal.  Skin: Skin is warm and dry. No rash noted. She is not diaphoretic. There is erythema. No pallor.  Psychiatric: Affect normal.    Assessment/Plan:     ICD-10-CM   1. S/P bilateral breast reduction  Z98.890    Mrs. Earnshaw is 12 days postop from bilateral breast reduction.  She has developed some wounds on the right breast at the inframammary fold vertical in junction as well as the nipple.  They do not appear infected.  The surrounding skin does appear slightly irritated.  There is no purulent drainage  noted.  There is no active bleeding noted.  She has not had any recent fevers, chills, nausea, vomiting.  Recommend ibuprofen and Tylenol.  Save last dose of Norco for pain control at night.  Xeroform gauze applied to right breast inframammary fold wound and right breast NAC wound.  Patient was divided with Xeroform to do daily dressing changes.  Follow-up in 1 week for reevaluation.  Patient and husband understand to call with any questions or concerns.  Reasons to call provided.  Recommended calling if right breast became more erythematous after finishing antibiotic.  Continue to avoid smoking.  Drink plenty of  water.  Avoid heavy lifting.  Eat healthy diet.  Carola Rhine Sausha Raymond, PA-C 02/12/2019, 12:10 PM

## 2019-02-12 NOTE — Telephone Encounter (Signed)
Called and spoke with the patient on (01/30/19) regarding the message below from Dr. Claudia Desanctis.  Patient stated that she can't take Tylenol because she has liver issues.  She said the last time she had her blood work drawn it showed her liver test were high.  She asked if Gabapentin could be called in.  I asked her what she was taking Gabapentin for and she stated that she was given them when she had broken ribs last Christmas.    Informed Dr. Claudia Desanctis of the message that she can't take the Tylenol because of her liver, and her request for the Gabapentin.  He stated that she will need to take the Ibuprofen.  Nothing else will be sent to the pharmacy.   Informed the patient of what Dr. Claudia Desanctis said above and she stated that her prescriptions that was sent to the pharmacy were picked up by someone else when her husband went to get them.  She asked what is she to do about the pain after surgery.  Informed her that she can take the Ibuprofen.  She stated that she may have to cancel her surgery.  She said we waited to the last minute regarding her medications, and I informed her that another prescription for pain medication can't be sent in again because they had been sent in all ready, and the pharmacy will question another prescription been called in.  She stated again that she did not get the prescriptions.  I informed her that this was not a last minute thing about her medications because she mentioned it to me on a telephone call on (01/17/19), and I called and spoke with her pharmacy and they informed me that the prescriptions were picked up.  She got upset and stated that she was not going to have the surgery.    I asked the patient to let me know what she would like to do and she stated that she told me and she hung up the phone.  Called the patient's pharmacy and spoke with Cori Razor) to follow-up on the prescriptions and was told again that the prescriptions were sold on (01/08/19 @ 2:37pm).  He stated that because  of COVID-19 they are not taking signature so they are not sure who picked the prescriptions up, but they will check into it.  He asked me to call back later.  Called back and spoke with Vicente Males) in the pharmacy and she was informed of the prescription issues and she stated as well that the prescriptions were sold on (01/08/19 @ 2:37pm), but because they are not taking signatures she not sure who picked it up.  She states that she will be reaching out to the Corporate office regarding this matter.  She also mentioned that this is not the first time they have had a issue with this patient not getting her medications and was told that someone else has picked them up.//AB/CMA

## 2019-02-14 ENCOUNTER — Telehealth: Payer: Self-pay

## 2019-02-14 NOTE — Telephone Encounter (Signed)
Mrs. Dillahunty returned my call today.  She reports that she is having trouble sleeping at night due to the pain.  At this time she is currently taking ibuprofen, Tylenol and gabapentin.  She reports that she takes 2 gabapentin's at night.  Gabapentin was prescribed by another provider for other pain unrelated to surgery.  I recommended she do ibuprofen 600 mg every 6 hours and 500 mg of Tylenol every 8 hours as needed for breakthrough pain.

## 2019-02-14 NOTE — Telephone Encounter (Signed)
Patient called requesting pain medication. She reports she was not able to sleep last night due to severe pain and the ibuprofen and Tylenol did not provide any relief.

## 2019-02-14 NOTE — Telephone Encounter (Signed)
Called patient, she did not answer. She can continue to use ibuprofen 600mg  q6 hrs and add tylenol as needed 500mg  q8hrs do not exceed 4000mg  in a day of tylenol. Do not take ibuprofen on an empty stomach. Continue to eat healthy. Avoid smoking.  I do not feel as if she needs any more narcotics. Her pain can be controlled with NSAIDs and APAP combo.

## 2019-02-14 NOTE — Telephone Encounter (Signed)
Faxed order for supplies to [facility]

## 2019-02-19 ENCOUNTER — Ambulatory Visit (INDEPENDENT_AMBULATORY_CARE_PROVIDER_SITE_OTHER): Payer: BC Managed Care – PPO | Admitting: Psychiatry

## 2019-02-19 ENCOUNTER — Other Ambulatory Visit: Payer: Self-pay

## 2019-02-19 DIAGNOSIS — F411 Generalized anxiety disorder: Secondary | ICD-10-CM

## 2019-02-19 DIAGNOSIS — F4310 Post-traumatic stress disorder, unspecified: Secondary | ICD-10-CM

## 2019-02-19 DIAGNOSIS — F3130 Bipolar disorder, current episode depressed, mild or moderate severity, unspecified: Secondary | ICD-10-CM

## 2019-02-19 MED ORDER — GABAPENTIN 400 MG PO CAPS: 400.0000 mg | ORAL_CAPSULE | Freq: Four times a day (QID) | ORAL | 2 refills | Status: DC

## 2019-02-19 MED ORDER — LURASIDONE HCL 40 MG PO TABS: 40.0000 mg | ORAL_TABLET | Freq: Every day | ORAL | 2 refills | Status: DC

## 2019-02-19 MED ORDER — FLUOXETINE HCL 20 MG PO CAPS: 60.0000 mg | ORAL_CAPSULE | Freq: Every day | ORAL | 1 refills | Status: DC

## 2019-02-19 MED ORDER — ALPRAZOLAM ER 1 MG PO TB24: 1.0000 mg | ORAL_TABLET | Freq: Two times a day (BID) | ORAL | 1 refills | Status: DC
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
## 2019-02-21 ENCOUNTER — Ambulatory Visit: Payer: BC Managed Care – PPO | Admitting: Surgical

## 2019-03-01 ENCOUNTER — Other Ambulatory Visit: Payer: Self-pay

## 2019-03-01 ENCOUNTER — Ambulatory Visit (INDEPENDENT_AMBULATORY_CARE_PROVIDER_SITE_OTHER): Payer: BC Managed Care – PPO | Admitting: Surgical

## 2019-03-01 ENCOUNTER — Encounter: Payer: Self-pay | Admitting: Surgical

## 2019-03-01 VITALS — BP 134/86 | HR 78 | Temp 96.8°F | Ht 63.0 in | Wt 173.4 lb

## 2019-03-01 DIAGNOSIS — Z9889 Other specified postprocedural states: Secondary | ICD-10-CM
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
## 2019-03-04 ENCOUNTER — Telehealth (INDEPENDENT_AMBULATORY_CARE_PROVIDER_SITE_OTHER): Payer: BC Managed Care – PPO | Admitting: Plastic Surgery

## 2019-03-04 DIAGNOSIS — Z9889 Other specified postprocedural states: Secondary | ICD-10-CM

## 2019-03-04 MED ORDER — DOXYCYCLINE HYCLATE 100 MG PO TABS: 100.0000 mg | ORAL_TABLET | Freq: Two times a day (BID) | ORAL | 0 refills | Status: AC

## 2019-03-04 NOTE — Telephone Encounter (Signed)
Received on call message. Spoke with patient. Reports nausea, diarrhea, stomach pain.  Does not think she's had a fever. Had occasional chills over last week. Breast pain has not increased, but reports redness has increased. Reports drainage from open wounds along IMF incisions, from one area possibly greenish in color. She received her dressing supplies yesterday so began using the xeroform then.  Reports area of 'lumpiness', suspect fat necrosis from her description.   Reports multiple boils on her legs with pus coming from them as well as 1-2 boils on her head.   Sent RX to pharmacy for Doxy 100mg  BID x 5 days. Recommended she take a daily probiotic and/or eat yogurt while on this medication as she's already having N/D.   Patient will come to office tomorrow morning 8:00 am to be seen.  Suspect possible staph infection with boils on legs and head. Concern is if this spreads to her breast incisions/open wounds. May desire coordination with PCP on testing/treatment for possible colinization with staph/MRSA.

## 2019-03-05 ENCOUNTER — Other Ambulatory Visit: Payer: Self-pay

## 2019-03-05 ENCOUNTER — Encounter: Payer: Self-pay | Admitting: Surgical

## 2019-03-05 ENCOUNTER — Ambulatory Visit: Payer: BC Managed Care – PPO | Admitting: Internal Medicine

## 2019-03-05 ENCOUNTER — Ambulatory Visit (INDEPENDENT_AMBULATORY_CARE_PROVIDER_SITE_OTHER): Payer: BC Managed Care – PPO | Admitting: Surgical

## 2019-03-05 VITALS — BP 116/81 | HR 76 | Temp 98.2°F

## 2019-03-05 VITALS — BP 120/82 | HR 72 | Temp 97.4°F | Wt 171.0 lb

## 2019-03-05 DIAGNOSIS — E7800 Pure hypercholesterolemia, unspecified: Secondary | ICD-10-CM | POA: Diagnosis not present

## 2019-03-05 DIAGNOSIS — E039 Hypothyroidism, unspecified: Secondary | ICD-10-CM | POA: Diagnosis not present

## 2019-03-05 DIAGNOSIS — R7303 Prediabetes: Secondary | ICD-10-CM

## 2019-03-05 DIAGNOSIS — L989 Disorder of the skin and subcutaneous tissue, unspecified: Secondary | ICD-10-CM | POA: Diagnosis not present

## 2019-03-05 DIAGNOSIS — Z9889 Other specified postprocedural states: Secondary | ICD-10-CM

## 2019-03-05 MED ORDER — MUPIROCIN 2 % EX OINT: 1.0000 "application " | TOPICAL_OINTMENT | Freq: Two times a day (BID) | CUTANEOUS | 0 refills | Status: DC

## 2019-03-05 NOTE — Progress Notes (Signed)
Subjective:     Patient ID: Lindsey Davenport, female    DOB: November 01, 1980, 39 y.o.   MRN: [id number]  Chief Complaint  Patient presents with  . Follow-up    Patient here follow up visit from bilateral breast reduction along with wounds on bilateral breast    HPI: The patient is a 39 y.o. female here for follow-up after bilateral breast reduction on 01/31/19. She is here with her husband. Patient called the on-call provider over the weekend reporting that she noticed some greenish drainage from her breast and multiple boils on her legs with pus draining from them over the weekend.  She is currently taking doxycycline 100 mg twice daily for 5 days, which was rx this weekend by on-call provider.  During evaluation she reports that she noticed the lesions this weekend while at [facility]. She reports that she had a measured temperature of approximately 99.8 F at some point over the weekend. The few lesions on her legs vary in size, but are approximately 0.5 cm in diameter with a central yellowish crust noted. Some erythema surrounding the lesions, but minimal tenderness or swelling noted. The erythema does not extend past the borders of the lesions, her legs do not appear cellulitic. Denies lower extremity pain. She has a few on her left anterior shin and a few on her right anterior shin.   She also reports that she noticed a week or 2 ago a "boil" at the back of her head, but has since resolved.  In regards to her breasts, she has been applying xeroform 1-2x daily pending soil level and drainage. She noted the drainage appeared greenish to her this weekend. Today on exam, I do not notice any greenish drainage or signs of breast infection, seroma, hematoma. She still has wounds noted on b/l IMF and b/l NAC. She does have yellow exudate on the wounds, likely from xeroform dressing. No increased redness or swelling. No foul odor noted.  She reports some intermittent chills. No cp, sob, leg pain.   Review of Systems  Constitutional: Positive for activity change and chills. Negative for fever.  Respiratory: Negative for shortness of breath.   Cardiovascular: Negative for chest pain and leg swelling.  Gastrointestinal: Positive for nausea.  Skin: Positive for color change and wound.    Objective:   Vital Signs BP 116/81 (BP Location: Right Arm, Patient Position: Sitting, Cuff Size: Normal)   Pulse 76   Temp 98.2 F (36.8 C) (Oral)  Vital Signs and Nursing Note Reviewed Chaperone present Physical Exam  Constitutional: She is oriented to person, place, and time and well-developed, well-nourished, and in no distress.  HENT:  Head: Normocephalic and atraumatic.  Cardiovascular: Normal rate.  Pulmonary/Chest: Effort normal.    Musculoskeletal:        General: No tenderness, deformity or edema. Normal range of motion.  Neurological: She is alert and oriented to person, place, and time. Gait normal.  Skin: Skin is warm and dry. She is not diaphoretic. There is erythema.     Bilateral LE with circular erythematous lesions with central crusting. No cellulitis or surrounding erythema. No tenderness.  Psychiatric: Mood and affect normal.    Assessment/Plan:     ICD-10-CM   1. S/P bilateral breast reduction  Z98.890     Recommend finishing antibiotic and calling PCP today for further evaluation of LE lesions. Possible staph infection? Possible insect bites? Unsure etiology. Do not appear to be worsening at this time, does not appear to be  cellulitis. No oral temperature today on evaluation. No signs of overt infection.  Continue with xeroform daily for BL breast wounds. Discussed plan with Dr. Claudia Desanctis after evaluation to update.  Avoid smoking as this is going to increase her risk of poor wound healing and developing additional wounds.  Healthy diet, drink plenty of water.  Call with questions or concerns, call if develop increased pain, fever, significant nausea, vomiting,  worsening symptoms. Patient scheduled to be seen in 10 days.   Carola Rhine Renan Danese, PA-C 03/05/2019, 10:17 AM
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
## 2019-03-06 ENCOUNTER — Telehealth: Payer: Self-pay

## 2019-03-06 ENCOUNTER — Encounter: Payer: Self-pay | Admitting: Internal Medicine

## 2019-03-06 LAB — LIPID PANEL
Cholesterol: 206 mg/dL — ABNORMAL HIGH (ref 0–200)
HDL: 40.7 mg/dL (ref >39.00)
LDL Cholesterol: 126 mg/dL — ABNORMAL HIGH (ref 0–99)
NonHDL: 165.05
Total CHOL/HDL Ratio: 5
Triglycerides: 197 mg/dL — ABNORMAL HIGH (ref 0.0–149.0)
VLDL: 39.4 mg/dL (ref 0.0–40.0)

## 2019-03-06 LAB — T4, FREE: Free T4: 0.73 ng/dL (ref 0.60–1.60)

## 2019-03-06 LAB — TSH: TSH: 1.19 u[IU]/mL (ref 0.35–4.50)

## 2019-03-06 LAB — HEMOGLOBIN A1C: Hgb A1c MFr Bld: 5.9 % (ref 4.6–6.5)

## 2019-03-06 MED ORDER — LEVOTHYROXINE SODIUM 50 MCG PO TABS: 50.0000 ug | ORAL_TABLET | Freq: Every day | ORAL | 1 refills | Status: DC

## 2019-03-06 NOTE — Patient Instructions (Signed)
Fat and Cholesterol Restricted Eating Plan Getting too much fat and cholesterol in your diet may cause health problems. Choosing the right foods helps keep your fat and cholesterol at normal levels. This can keep you from getting certain diseases. Your doctor may recommend an eating plan that includes:  Total fat: ______% or less of total calories a day.  Saturated fat: ______% or less of total calories a day.  Cholesterol: less than _________mg a day.  Fiber: ______g a day. What are tips for following this plan? Meal planning  At meals, divide your plate into four equal parts: ? Fill one-half of your plate with vegetables and green salads. ? Fill one-fourth of your plate with whole grains. ? Fill one-fourth of your plate with low-fat (lean) protein foods.  Eat fish that is high in omega-3 fats at least two times a week. This includes mackerel, tuna, sardines, and salmon.  Eat foods that are high in fiber, such as whole grains, beans, apples, broccoli, carrots, peas, and barley. General tips   Work with your doctor to lose weight if you need to.  Avoid: ? Foods with added sugar. ? Fried foods. ? Foods with partially hydrogenated oils.  Limit alcohol intake to no more than 1 drink a day for nonpregnant women and 2 drinks a day for men. One drink equals 12 oz of beer, 5 oz of wine, or 1 oz of hard liquor. Reading food labels  Check food labels for: ? Trans fats. ? Partially hydrogenated oils. ? Saturated fat (g) in each serving. ? Cholesterol (mg) in each serving. ? Fiber (g) in each serving.  Choose foods with healthy fats, such as: ? Monounsaturated fats. ? Polyunsaturated fats. ? Omega-3 fats.  Choose grain products that have whole grains. Look for the word "whole" as the first word in the ingredient list. Cooking  Cook foods using low-fat methods. These include baking, boiling, grilling, and broiling.  Eat more home-cooked foods. Eat at restaurants and buffets  less often.  Avoid cooking using saturated fats, such as butter, cream, palm oil, palm kernel oil, and coconut oil. Recommended foods  Fruits  All fresh, canned (in natural juice), or frozen fruits. Vegetables  Fresh or frozen vegetables (raw, steamed, roasted, or grilled). Green salads. Grains  Whole grains, such as whole wheat or whole grain breads, crackers, cereals, and pasta. Unsweetened oatmeal, bulgur, barley, quinoa, or brown rice. Corn or whole wheat flour tortillas. Meats and other protein foods  Ground beef (85% or leaner), grass-fed beef, or beef trimmed of fat. Skinless chicken or turkey. Ground chicken or turkey. Pork trimmed of fat. All fish and seafood. Egg whites. Dried beans, peas, or lentils. Unsalted nuts or seeds. Unsalted canned beans. Nut butters without added sugar or oil. Dairy  Low-fat or nonfat dairy products, such as skim or 1% milk, 2% or reduced-fat cheeses, low-fat and fat-free ricotta or cottage cheese, or plain low-fat and nonfat yogurt. Fats and oils  Tub margarine without trans fats. Light or reduced-fat mayonnaise and salad dressings. Avocado. Olive, canola, sesame, or safflower oils. The items listed above may not be a complete list of foods and beverages you can eat. Contact a dietitian for more information. Foods to avoid Fruits  Canned fruit in heavy syrup. Fruit in cream or butter sauce. Fried fruit. Vegetables  Vegetables cooked in cheese, cream, or butter sauce. Fried vegetables. Grains  White bread. White pasta. White rice. Cornbread. Bagels, pastries, and croissants. Crackers and snack foods that contain trans fat   and hydrogenated oils. Meats and other protein foods  Fatty cuts of meat. Ribs, chicken wings, bacon, sausage, bologna, salami, chitterlings, fatback, hot dogs, bratwurst, and packaged lunch meats. Liver and organ meats. Whole eggs and egg yolks. Chicken and turkey with skin. Fried meat. Dairy  Whole or 2% milk, cream,  half-and-half, and cream cheese. Whole milk cheeses. Whole-fat or sweetened yogurt. Full-fat cheeses. Nondairy creamers and whipped toppings. Processed cheese, cheese spreads, and cheese curds. Beverages  Alcohol. Sugar-sweetened drinks such as sodas, lemonade, and fruit drinks. Fats and oils  Butter, stick margarine, lard, shortening, ghee, or bacon fat. Coconut, palm kernel, and palm oils. Sweets and desserts  Corn syrup, sugars, honey, and molasses. Candy. Jam and jelly. Syrup. Sweetened cereals. Cookies, pies, cakes, donuts, muffins, and ice cream. The items listed above may not be a complete list of foods and beverages you should avoid. Contact a dietitian for more information. Summary  Choosing the right foods helps keep your fat and cholesterol at normal levels. This can keep you from getting certain diseases.  At meals, fill one-half of your plate with vegetables and green salads.  Eat high-fiber foods, like whole grains, beans, apples, carrots, peas, and barley.  Limit added sugar, saturated fats, alcohol, and fried foods. This information is not intended to replace advice given to you by your health care provider. Make sure you discuss any questions you have with your health care provider. Document Revised: 09/21/2017 Document Reviewed: 10/05/2016 Elsevier Patient Education  2020 Elsevier Inc.  

## 2019-03-06 NOTE — Telephone Encounter (Signed)
Pt is aware as instructed and expressed understanding ? ?Rx sent through e-scribe ? ?

## 2019-03-06 NOTE — Telephone Encounter (Signed)
I never received results, looks like Rollene Fare released to [facility]... will call pt paste results below   Cholesterol is better. I want you to stop the fish oil, I think it is increasing your triglycerides. Consume a low fat diet. We will continue to monitor this. Thyroid is normal. A1C trending down, still prediabetic. Consume a low carb diet and exercise for weight loss.

## 2019-03-06 NOTE — Telephone Encounter (Signed)
Pt left v/m requesting cb about lab results. 

## 2019-03-10 ENCOUNTER — Encounter: Payer: Self-pay | Admitting: Internal Medicine

## 2019-03-12 ENCOUNTER — Encounter: Payer: Self-pay | Admitting: Internal Medicine

## 2019-03-12 ENCOUNTER — Telehealth: Payer: Self-pay | Admitting: *Deleted

## 2019-03-12 NOTE — Telephone Encounter (Signed)
Faxed order on (03/01/19) to [facility] for supplies for the patient.  Confirmation received.//AB/CMA

## 2019-03-14 ENCOUNTER — Encounter: Payer: Self-pay | Admitting: Surgical

## 2019-03-14 ENCOUNTER — Ambulatory Visit: Payer: BC Managed Care – PPO | Admitting: Internal Medicine

## 2019-03-14 ENCOUNTER — Ambulatory Visit (INDEPENDENT_AMBULATORY_CARE_PROVIDER_SITE_OTHER): Payer: BC Managed Care – PPO | Admitting: Surgical

## 2019-03-14 ENCOUNTER — Other Ambulatory Visit: Payer: Self-pay

## 2019-03-14 ENCOUNTER — Encounter: Payer: Self-pay | Admitting: Internal Medicine

## 2019-03-14 VITALS — BP 122/78 | HR 78 | Temp 97.3°F | Wt 176.0 lb

## 2019-03-14 VITALS — BP 121/75 | HR 71 | Temp 97.3°F | Ht 63.0 in | Wt 176.8 lb

## 2019-03-14 DIAGNOSIS — R1013 Epigastric pain: Secondary | ICD-10-CM

## 2019-03-14 DIAGNOSIS — S21009D Unspecified open wound of unspecified breast, subsequent encounter: Secondary | ICD-10-CM

## 2019-03-14 DIAGNOSIS — R140 Abdominal distension (gaseous): Secondary | ICD-10-CM | POA: Diagnosis not present

## 2019-03-14 DIAGNOSIS — Z9889 Other specified postprocedural states: Secondary | ICD-10-CM

## 2019-03-14 MED ORDER — SUCRALFATE 1 G PO TABS: 1.0000 g | ORAL_TABLET | Freq: Three times a day (TID) | ORAL | 0 refills | Status: DC
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
## 2019-03-14 NOTE — Progress Notes (Signed)
   Subjective:     Patient ID: Lindsey Davenport, female    DOB: [birth year], 39 y.o.   MRN: [id number]  Chief Complaint  Patient presents with  . Follow-up    Patient herer for follow up visit from BL breast reduction    HPI: The patient is a 39 y.o. female here for follow-up after bilateral breast reduction on 01/31/2019.  She is here with her husband today.   She reports that she is continuing to have some pain, managing this with ibuprofen and Tylenol. She reports that she has been smoking approximately half a pack of cigarettes per day.  Her bilateral breast wounds have significantly improved since her last visit, she has had a lot of epithelialization since then and has a good base of granulation tissue at the bilateral inframammary fold wounds.  They are mostly superficial at this point and the granulation tissue is flush with surrounding epithelium.  There is no foul odor.  No purulent drainage.  She continues to do daily dressing changes with Xeroform, ABD.  She reports that the 4 x 4 gauze were not doing as well with dressing changes because they would stick.  No f/c/n/v.  She reports that it is beginning to itch at the inframammary fold wounds.   Review of Systems  Constitutional: Negative.   Respiratory: Negative.   Skin: Positive for wound.     Objective:   Vital Signs BP 121/75 (BP Location: Left Arm, Patient Position: Sitting, Cuff Size: Normal)   Pulse 71   Temp (!) 97.3 F (36.3 C) (Temporal)   Ht 5\' 3"  (1.6 m)   Wt 176 lb 12.8 oz (80.2 kg)   SpO2 96%   BMI 31.32 kg/m  Vital Signs and Nursing Note Reviewed Chaperone present Physical Exam  Constitutional: She is oriented to person, place, and time and well-developed, well-nourished, and in no distress.  Cardiovascular: Normal rate.  Pulmonary/Chest: Effort normal. Right breast exhibits tenderness. Right breast exhibits no inverted nipple and no nipple discharge. Left breast exhibits tenderness. Left breast  exhibits no inverted nipple and no nipple discharge.    Neurological: She is alert and oriented to person, place, and time. Gait normal.  Skin: Skin is warm and dry. She is not diaphoretic. No erythema.  Psychiatric: Mood and affect normal.      Assessment/Plan:     ICD-10-CM   1. S/P bilateral breast reduction  Z98.890   2. Breast wound, unspecified laterality, subsequent encounter  S21.009D     Mrs. Goodridge is overall doing well, wounds are healing nicely with good granular base and new epithelial tissue noted today.  There is no sign of infection, seroma, hematoma.   Follow-up in 1 week for reevaluation, we discussed if she feels she is doing well at that point and does not have any questions or concerns she can cancel 1 week follow-up and come for follow-up in 2 weeks.  Patient is in agreement with this plan.  She understands that if she has any questions or concerns do not hesitate to come in in 1 week or call sooner.  I am pleased with her progress so far, counseled on decreasing smoking as this can contribute to delayed healing.  Call with any questions or concerns  Pictures were obtained of the patient and placed in the chart with the patient's or guardian's permission.    Carola Rhine Nickie Deren, PA-C 03/14/2019, 2:20 PM

## 2019-03-15 ENCOUNTER — Encounter: Payer: Self-pay | Admitting: Internal Medicine

## 2019-03-15 ENCOUNTER — Ambulatory Visit: Payer: BC Managed Care – PPO | Admitting: Internal Medicine

## 2019-03-15 ENCOUNTER — Telehealth: Payer: Self-pay | Admitting: Plastic Surgery

## 2019-03-15 LAB — COMPREHENSIVE METABOLIC PANEL
ALT: 19 U/L (ref 0–35)
AST: 17 U/L (ref 0–37)
Albumin: 4 g/dL (ref 3.5–5.2)
Alkaline Phosphatase: 93 U/L (ref 39–117)
BUN: 16 mg/dL (ref 6–23)
CO2: 31 mEq/L (ref 19–32)
Calcium: 9.4 mg/dL (ref 8.4–10.5)
Chloride: 105 mEq/L (ref 96–112)
Creatinine, Ser: 0.85 mg/dL (ref 0.40–1.20)
GFR: 74.04 mL/min (ref >60.00)
Glucose, Bld: 64 mg/dL — ABNORMAL LOW (ref 70–99)
Potassium: 4.5 mEq/L (ref 3.5–5.1)
Sodium: 141 mEq/L (ref 135–145)
Total Bilirubin: 0.2 mg/dL (ref 0.2–1.2)
Total Protein: 6.4 g/dL (ref 6.0–8.3)

## 2019-03-15 LAB — CBC
HCT: 38.3 % (ref 36.0–46.0)
Hemoglobin: 12.7 g/dL (ref 12.0–15.0)
MCHC: 33.1 g/dL (ref 30.0–36.0)
MCV: 90.1 fl (ref 78.0–100.0)
Platelets: 210 10*3/uL (ref 150.0–400.0)
RBC: 4.25 Mil/uL (ref 3.87–5.11)
RDW: 13.9 % (ref 11.5–15.5)
WBC: 6.2 10*3/uL (ref 4.0–10.5)

## 2019-03-15 LAB — LIPASE: Lipase: 64 U/L — ABNORMAL HIGH (ref 11.0–59.0)

## 2019-03-15 LAB — H. PYLORI ANTIBODY, IGG: H Pylori IgG: NEGATIVE

## 2019-03-15 LAB — AMYLASE: Amylase: 45 U/L (ref 27–131)

## 2019-03-15 NOTE — Telephone Encounter (Signed)
Pt called in to say that she visited her PCP yesterday after leaving our office and she is not able to take Advil because it causes stomach issues because she has to take too much and she said she is not able to take tylenol either per her PCP. Mrs. Wieringa would like to know what she is supposed to do for pain. "Her PCP said it's our office's responsibility to prescribe her something for pain" Please call to advise

## 2019-03-15 NOTE — Telephone Encounter (Signed)
Lindsey Davenport called the office today reporting that she saw her PCP yesterday and they recommended she stop taking ibuprofen and Tylenol.  I reached out to PCP, Lindsey Silversmith, NP to confirm and she reported that ibuprofen should be discontinued due to epigastric pain/possible gastritis, but she is fine to take Tylenol.  Review of recent labs drawn on 03/14/2019 revealed normal AST ALT, normal GFR, normal BUN, normal creatinine.  AST 17, ALT 19. Lipase slightly elevated at 64.  Normal amylase.   I called to speak with Lindsey Davenport about pain control and recommend Tylenol intermittently for pain control due to her labs being relatively normal and no sign of any liver issues. She reported that she has had elevated liver enzymes in the past, which upon EMR review showed AST of 26 and ALT of 59 (09/21/2018).   CMET on 12/13/2018 showed AST 15, ALT 15.  AST 21, ALT 21 on 11/07/2017.  AST 24, ALT 19 on 01/29/2015.  Patient reported that PCP stated we could try other medications such as tramadol.   I do not feel as if she needs to have any additional medications at this time, her wounds are healing well and tramadol is not something I typically prescribed for pain control.   I was further explaining post op pain control protocol and patient abruptly ended phone call.

## 2019-03-15 NOTE — Patient Instructions (Signed)
Gastritis, Adult  Gastritis is swelling (inflammation) of the stomach. Gastritis can develop quickly (acute). It can also develop slowly over time (chronic). It is important to get help for this condition. If you do not get help, your stomach can bleed, and you can get sores (ulcers) in your stomach. What are the causes? This condition may be caused by:  Germs that get to your stomach.  Drinking too much alcohol.  Medicines you are taking.  Too much acid in the stomach.  A disease of the intestines or stomach.  Stress.  An allergic reaction.  Crohn's disease.  Some cancer treatments (radiation). Sometimes the cause of this condition is not known. What are the signs or symptoms? Symptoms of this condition include:  Pain in your stomach.  A burning feeling in your stomach.  Feeling sick to your stomach (nauseous).  Throwing up (vomiting).  Feeling too full after you eat.  Weight loss.  Bad breath.  Throwing up blood.  Blood in your poop (stool). How is this diagnosed? This condition may be diagnosed with:  Your medical history and symptoms.  A physical exam.  Tests. These can include: ? Blood tests. ? Stool tests. ? A procedure to look inside your stomach (upper endoscopy). ? A test in which a sample of tissue is taken for testing (biopsy). How is this treated? Treatment for this condition depends on what caused it. You may be given:  Antibiotic medicine, if your condition was caused by germs.  H2 blockers and similar medicines, if your condition was caused by too much acid. Follow these instructions at home: Medicines  Take over-the-counter and prescription medicines only as told by your doctor.  If you were prescribed an antibiotic medicine, take it as told by your doctor. Do not stop taking it even if you start to feel better. Eating and drinking   Eat small meals often, instead of large meals.  Avoid foods and drinks that make your symptoms  worse.  Drink enough fluid to keep your pee (urine) pale yellow. Alcohol use  Do not drink alcohol if: ? Your doctor tells you not to drink. ? You are pregnant, may be pregnant, or are planning to become pregnant.  If you drink alcohol: ? Limit your use to:  0-1 drink a day for women.  0-2 drinks a day for men. ? Be aware of how much alcohol is in your drink. In the [place] one drink equals one 12 oz bottle of beer (355 mL), one 5 oz glass of wine (148 mL), or one 1 oz glass of hard liquor (44 mL). General instructions  Talk with your doctor about ways to manage stress. You can exercise or do deep breathing, meditation, or yoga.  Do not smoke or use products that have nicotine or tobacco. If you need help quitting, ask your doctor.  Keep all follow-up visits as told by your doctor. This is important. Contact a doctor if:  Your symptoms get worse.  Your symptoms go away and then come back. Get help right away if:  You throw up blood or something that looks like coffee grounds.  You have black or dark red poop.  You throw up any time you try to drink fluids.  Your stomach pain gets worse.  You have a fever.  You do not feel better after one week. Summary  Gastritis is swelling (inflammation) of the stomach.  You must get help for this condition. If you do not get help, your stomach   can bleed, and you can get sores (ulcers).  This condition is diagnosed with medical history, physical exam, or tests.  You can be treated with medicines for germs or medicines to block too much acid in your stomach. This information is not intended to replace advice given to you by your health care provider. Make sure you discuss any questions you have with your health care provider. Document Revised: 06/07/2017 Document Reviewed: 06/07/2017 Elsevier Patient Education  2020 [employer]  

## 2019-03-21 ENCOUNTER — Ambulatory Visit: Payer: BC Managed Care – PPO | Admitting: Surgical

## 2019-03-27 ENCOUNTER — Other Ambulatory Visit (HOSPITAL_COMMUNITY): Payer: Self-pay | Admitting: Psychiatry

## 2019-03-27 ENCOUNTER — Telehealth (HOSPITAL_COMMUNITY): Payer: Self-pay | Admitting: *Deleted

## 2019-03-27 MED ORDER — ALPRAZOLAM 0.5 MG PO TABS: 0.5000 mg | ORAL_TABLET | Freq: Two times a day (BID) | ORAL | 2 refills | Status: DC | PRN

## 2019-03-27 NOTE — Telephone Encounter (Signed)
Writer received message from pt c/o Xanax Er being "too strong" and wanting to either try something else or return to IR Xanax. Please review and advise. Pt has an upcoming appointment on 04/19/19. Marland Kitchen

## 2019-03-27 NOTE — Telephone Encounter (Signed)
I changed it back to original Rx - 0.5 mg IR  bid prn

## 2019-03-28 ENCOUNTER — Ambulatory Visit: Payer: BC Managed Care – PPO | Admitting: Surgical

## 2019-03-29 ENCOUNTER — Encounter: Payer: Self-pay | Admitting: Surgical

## 2019-03-29 ENCOUNTER — Other Ambulatory Visit: Payer: Self-pay

## 2019-03-29 ENCOUNTER — Ambulatory Visit (INDEPENDENT_AMBULATORY_CARE_PROVIDER_SITE_OTHER): Payer: BC Managed Care – PPO | Admitting: Surgical

## 2019-03-29 VITALS — BP 110/76 | HR 71 | Temp 98.4°F | Ht 63.0 in | Wt 173.8 lb

## 2019-03-29 DIAGNOSIS — S21009D Unspecified open wound of unspecified breast, subsequent encounter: Secondary | ICD-10-CM

## 2019-03-29 DIAGNOSIS — Z9889 Other specified postprocedural states: Secondary | ICD-10-CM

## 2019-03-29 MED ORDER — DOXYCYCLINE HYCLATE 100 MG PO TABS: 100.0000 mg | ORAL_TABLET | Freq: Two times a day (BID) | ORAL | 0 refills | Status: AC
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
## 2019-04-04 ENCOUNTER — Ambulatory Visit: Payer: BC Managed Care – PPO | Admitting: Surgical

## 2019-04-05 ENCOUNTER — Ambulatory Visit: Payer: BC Managed Care – PPO | Admitting: Surgical

## 2019-04-19 ENCOUNTER — Ambulatory Visit (HOSPITAL_COMMUNITY): Payer: BC Managed Care – PPO | Admitting: Psychiatry

## 2019-04-19 ENCOUNTER — Other Ambulatory Visit: Payer: Self-pay

## 2019-05-06 ENCOUNTER — Other Ambulatory Visit (HOSPITAL_COMMUNITY): Payer: Self-pay | Admitting: Psychiatry

## 2019-05-08 ENCOUNTER — Other Ambulatory Visit (HOSPITAL_COMMUNITY): Payer: Self-pay | Admitting: Psychiatry

## 2019-05-08 ENCOUNTER — Ambulatory Visit (INDEPENDENT_AMBULATORY_CARE_PROVIDER_SITE_OTHER): Payer: BC Managed Care – PPO | Admitting: Psychiatry

## 2019-05-08 ENCOUNTER — Other Ambulatory Visit: Payer: Self-pay

## 2019-05-08 ENCOUNTER — Telehealth (HOSPITAL_COMMUNITY): Payer: Self-pay | Admitting: *Deleted

## 2019-05-08 DIAGNOSIS — F3130 Bipolar disorder, current episode depressed, mild or moderate severity, unspecified: Secondary | ICD-10-CM | POA: Diagnosis not present

## 2019-05-08 DIAGNOSIS — F411 Generalized anxiety disorder: Secondary | ICD-10-CM | POA: Diagnosis not present

## 2019-05-08 DIAGNOSIS — F410 Panic disorder [episodic paroxysmal anxiety] without agoraphobia: Secondary | ICD-10-CM | POA: Diagnosis not present

## 2019-05-08 MED ORDER — ALPRAZOLAM ER 0.5 MG PO TB24: 0.5000 mg | ORAL_TABLET | Freq: Every day | ORAL | 1 refills | Status: DC

## 2019-05-08 NOTE — Telephone Encounter (Signed)
Pt needs a refill of the Xanax 0.5mg . Dr. Lucy Antigua order did not go through. Please review. Pt has appointment today.
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
## 2019-05-09 MED ORDER — ALPRAZOLAM ER 0.5 MG PO TB24: 0.5000 mg | ORAL_TABLET | Freq: Every day | ORAL | 0 refills | Status: DC

## 2019-05-09 NOTE — Telephone Encounter (Signed)
Done

## 2019-05-15 ENCOUNTER — Ambulatory Visit: Payer: BC Managed Care – PPO | Admitting: Internal Medicine

## 2019-05-15 ENCOUNTER — Encounter: Payer: Self-pay | Admitting: Internal Medicine

## 2019-05-15 DIAGNOSIS — Z0289 Encounter for other administrative examinations: Secondary | ICD-10-CM
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
## 2019-05-29 ENCOUNTER — Encounter (HOSPITAL_COMMUNITY): Payer: Self-pay

## 2019-05-29 ENCOUNTER — Encounter: Payer: Self-pay | Admitting: Internal Medicine

## 2019-06-04 ENCOUNTER — Ambulatory Visit: Payer: BC Managed Care – PPO | Admitting: Internal Medicine

## 2019-06-07 ENCOUNTER — Encounter: Payer: Self-pay | Admitting: Internal Medicine

## 2019-06-07 ENCOUNTER — Ambulatory Visit: Payer: BC Managed Care – PPO | Admitting: Internal Medicine

## 2019-06-07 ENCOUNTER — Other Ambulatory Visit: Payer: Self-pay

## 2019-06-07 VITALS — BP 108/70 | HR 64 | Temp 97.6°F | Wt 168.0 lb

## 2019-06-07 DIAGNOSIS — F1490 Cocaine use, unspecified, uncomplicated: Secondary | ICD-10-CM | POA: Diagnosis not present

## 2019-06-07 DIAGNOSIS — R21 Rash and other nonspecific skin eruption: Secondary | ICD-10-CM

## 2019-06-07 DIAGNOSIS — N898 Other specified noninflammatory disorders of vagina: Secondary | ICD-10-CM | POA: Diagnosis not present

## 2019-06-07 NOTE — Addendum Note (Signed)
Addended by: Lurlean Nanny on: 06/07/2019 01:41 PM   Modules accepted: Orders

## 2019-06-07 NOTE — Patient Instructions (Signed)
Substance Use Disorder and Mental Illness Substance use disorder is a condition in which a person is dependent on a substance, such as drugs or alcohol. A mental illness is a condition that occurs when someone experiences changes in mood, behavior, or thinking. Sometimes, these two conditions can occur at the same time (co-occurring disorders) and may be diagnosed together (dual diagnosis). What is the relationship between substance use disorder and mental illness? Substance use disorder and mental illness can share symptoms and can have similar causes, such as exposure to stress or changes in brain chemicals. The risk for developing both of these conditions can be passed from parent to child (inherited). People with mental illnesses sometimes use drugs to try and relieve symptoms, and this can lead to substance use disorder. Substance use disorders occur more often in people who have depression, schizophrenia, anxiety, or personality disorders. When some drugs are used regularly, they can cause people to have symptoms of mental illness. What are the signs or symptoms? Symptoms vary widely for substance use disorder and mental illness, especially because these conditions can be present at the same time. Signs of a substance use disorder may include:  Failure to meet responsibilities at home, work, or school.  Using substances in risky situation, such as while driving or using machinery.  Taking serious risks to get drugs or alcohol.  Spending less time on activities or hobbies that used to be important.  Changes in personality or attitude for no reason, such as angry outbursts, symptoms of anxiety, or unusual giddiness.  Trying to hide the amount of drugs or alcohol used.  Increased substance use over time, or needing to use more of a substance to feel the same effects (developing a tolerance).  Sudden weight loss or gain.  Sleeping too much or too little.  Uncontrolled trembling or shaking  (tremors), slurred speech, or lack of coordination.  Continuing to use alcohol or a drug even though using it has led to bad outcomes or consequences, such as losing a job or ending a relationship. Signs of mental illness may include:  Extreme mood changes (mood swings).  Sleeping too much or too little.  Weight loss or weight gain.  Being easily distracted.  Confused thinking or trouble concentrating.  Expressing thoughts of suicide.  Withdrawing from friends and family, or sudden changes in social behaviors or hobbies.  Aggression toward people and animals.  Repeatedly breaking serious rules or breaking the law.  Having persistent thoughts or urges that are unpleasant or feel out of control (involuntary). The person may feel the need to act on the urges in order to reduce anxiety.  Persistent feelings of sadness or hopelessness.  False beliefs (delusions).  Seeing, hearing, tasting, smelling, or feeling things that are not real (hallucinations). How is this diagnosed? Substance use disorder and mental illnesses can be difficult to diagnose at the same time because of how varied and complex the symptoms are. Because these conditions can interact, one condition may be missed. This is why it is important to be completely honest with your health care provider about substance use and your other symptoms. Diagnosing your condition may include:  A physical exam.  A review of your medical history and your symptoms. Your health care provider may refer you to a mental health professional for a psychiatric evaluation. This may include assessments of:  Your use of substances.  Your risk of suicide.  Your risk of aggressive behaviors.  Your lifestyle, environment, and social situations.  Your  medical health.  Your mental health and behavioral history. How is this treated? It is best to treat substance use disorder and mental illness at the same time (integrated treatment  approach). Treatment usually involves more than one of the following methods:  Detox. This refers to stopping substance abuse while being monitored by trained medical staff. This is usually the first step in treatment. Detox can last for up to 7 days.  Rehabilitation. This involves staying in a treatment center where you can have medical and mental health support all the time.  Medicines to relieve symptoms of mental illness and to control symptoms that are caused by stopping substance abuse (withdrawal symptoms).  Support groups. These groups encourage you to talk about your fears, frustrations, and anxieties with others who have the same condition.  Talk therapy. This is one-on-one therapy that can help you learn about your illness and learn ways to cope with symptoms or side effects. Follow these instructions at home: Lifestyle  Exercise regularly. Aim for 150 minutes of moderate exercise (such as walking or biking) or 75 minutes of vigorous exercise (such as running) each week.  Eat a healthy diet with plenty of fruits and vegetables, whole grains, and lean proteins.  Avoid caffeine and tobacco. These can worsen symptoms and anxiety.  Do not drink alcohol or use drugs.  Try to get 7-9 hours of sleep each night. To do this: ? Keep your bedroom cool and dark. ? Do not eat a heavy meal during the hour before you go to bed. ? Do not have caffeine before bedtime. ? Avoid screen time during the few hours before bedtime. This means not watching TV and not using a computer, cell phone, or tablet. Medicines  Take over-the-counter and prescription medicines only as told by your health care provider.  Do not stop taking medicines unless you ask your health care provider if it is safe to do that.  Tell your health care provider about any medicine side effects that you experience. General instructions  Follow your treatment plan as directed. Work with your health care provider to adjust  your treatment plan as needed.  Attend support group or therapy sessions as directed.  Explain your diagnosis to your friends and family. Let them know what your symptoms are and what things cause your symptoms to start (triggers).  Avoid triggers or stressors that may worsen your symptoms or cause you to use alcohol or drugs. Spend time with family and friends who do not use substances.  Make time to relax and do self-soothing activities, such as meditating or listening to music.  Keep all follow-up visits as told by your health care provider and therapist. This is important. Where to find support You may find support for coping with substance use disorder and mental illness from:  Your health care providers or your therapist. These providers can treat you or they can help you find services to treat your condition.  Local support groups for people with your condition. Your health care provider or therapist may be able to recommend a support group. This may be a hospital support group, a [employer] [facility] [place] support group, or a 12-step group such as Alcoholics Anonymous (AA) or Narcotics Anonymous (NA).  Family and friends. Let them know what they can do to best support you through your recovery process. Where to find more information You may find more information about substance use disorder and mental illness from:  Substance Abuse and Mental Health  Services Administration [facility]): ? Online: ktimeonline.com ? [employer], to talk with a person who can help you find information about treatment services in your area: (309)781-6756 367-570-2187)  U.S. Department of Health and Financial controller mental health services: http://greene.com/  National Alliance on Mental Illness (NAMI): www.nami.[place] on Alcoholism and Drug Dependence: www.ncadd.org Contact a health care provider if:  Your symptoms get worse or they do not get better.  You have  negative side effects from taking medicines.  You want to discuss stopping medicines or treatment.  You start using a substance again (have a relapse). Get help right away if:  You have thoughts about harming yourself or others. If you ever feel like you may hurt yourself or others, or have thoughts about taking your own life, get help right away. You can go to your nearest emergency department or call:  Your local emergency services (911 in the [place]  A suicide crisis helpline, such as the [facility] at 224-744-0294. This is open 24 hours a day. Summary  Substance use disorder and mental illness can occur at the same time (co-occurring disorders), and they may be diagnosed together (dual diagnosis).  These conditions can be difficult to diagnose at the same time. It is important to be completely honest with your health care provider about your substance use and your other symptoms.  It is best to treat substance use disorder and mental illness at the same time (integrated treatment approach).  Identifying new ways to deal with triggers and difficult situations is an important part of recovery.  Keep all follow-up visits as told by your health care provider and therapist. Be consistent when attending support groups or treatment programs. This information is not intended to replace advice given to you by your health care provider. Make sure you discuss any questions you have with your health care provider. Document Revised: 03/16/2018 Document Reviewed: 06/04/2016 Elsevier Patient Education  [place].
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
## 2019-06-08 ENCOUNTER — Encounter: Payer: Self-pay | Admitting: Internal Medicine

## 2019-06-08 LAB — WET PREP BY MOLECULAR PROBE
Candida species: NOT DETECTED
MICRO NUMBER:: 10447606
SPECIMEN QUALITY:: ADEQUATE
Trichomonas vaginosis: NOT DETECTED

## 2019-06-08 LAB — C. TRACHOMATIS/N. GONORRHOEAE RNA
C. trachomatis RNA, TMA: NOT DETECTED
N. gonorrhoeae RNA, TMA: NOT DETECTED

## 2019-06-08 MED ORDER — METRONIDAZOLE 0.75 % VA GEL: 1.0000 | Freq: Two times a day (BID) | VAGINAL | 0 refills | Status: DC

## 2019-06-08 NOTE — Addendum Note (Signed)
Addended by: Jearld Fenton on: 06/08/2019 02:41 PM   Modules accepted: Orders

## 2019-06-11 ENCOUNTER — Telehealth (INDEPENDENT_AMBULATORY_CARE_PROVIDER_SITE_OTHER): Payer: BC Managed Care – PPO | Admitting: Psychiatry

## 2019-06-11 ENCOUNTER — Other Ambulatory Visit: Payer: Self-pay

## 2019-06-11 DIAGNOSIS — F411 Generalized anxiety disorder: Secondary | ICD-10-CM | POA: Diagnosis not present

## 2019-06-11 DIAGNOSIS — F1421 Cocaine dependence, in remission: Secondary | ICD-10-CM

## 2019-06-11 DIAGNOSIS — F3130 Bipolar disorder, current episode depressed, mild or moderate severity, unspecified: Secondary | ICD-10-CM

## 2019-06-11 MED ORDER — DIVALPROEX SODIUM ER 500 MG PO TB24: 1000.0000 mg | ORAL_TABLET | Freq: Every evening | ORAL | 2 refills | Status: DC
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
## 2019-06-28 ENCOUNTER — Other Ambulatory Visit: Payer: Self-pay | Admitting: Family Medicine

## 2019-06-29 ENCOUNTER — Telehealth (INDEPENDENT_AMBULATORY_CARE_PROVIDER_SITE_OTHER): Payer: BC Managed Care – PPO | Admitting: Family Medicine

## 2019-06-29 DIAGNOSIS — J069 Acute upper respiratory infection, unspecified: Secondary | ICD-10-CM | POA: Diagnosis not present

## 2019-06-29 MED ORDER — PREDNISONE 10 MG PO TABS
ORAL_TABLET | ORAL | 0 refills | Status: DC
Start: 2019-06-29 — End: 2019-12-20

## 2019-06-29 MED ORDER — BENZONATATE 200 MG PO CAPS: 200.0000 mg | ORAL_CAPSULE | Freq: Three times a day (TID) | ORAL | 1 refills | Status: DC | PRN

## 2019-06-29 NOTE — Assessment & Plan Note (Signed)
With neg covid testing  Sick all week and starting to feel better today Some wheezing  All mucous is clear  Px prednisone taper 30 mg-disc side eff Also tessalon for cough otc symptom care-use what helps  Update if not starting to improve in a week or if worsening  (or if any new symptoms)

## 2019-06-29 NOTE — Patient Instructions (Signed)
Drink fluids and rest  Take the prednisone as directed for wheezing and headache  Try tessalon for cough  Use medication over the counter if it helps   Update if not starting to improve in a week or if worsening  (or if no improvement)
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
## 2019-07-01 ENCOUNTER — Encounter: Payer: Self-pay | Admitting: Family Medicine

## 2019-07-19 ENCOUNTER — Other Ambulatory Visit: Payer: Self-pay

## 2019-07-19 ENCOUNTER — Telehealth (INDEPENDENT_AMBULATORY_CARE_PROVIDER_SITE_OTHER): Payer: BC Managed Care – PPO | Admitting: Psychiatry

## 2019-07-19 DIAGNOSIS — F3130 Bipolar disorder, current episode depressed, mild or moderate severity, unspecified: Secondary | ICD-10-CM | POA: Diagnosis not present

## 2019-07-19 DIAGNOSIS — F411 Generalized anxiety disorder: Secondary | ICD-10-CM

## 2019-07-19 DIAGNOSIS — F1421 Cocaine dependence, in remission: Secondary | ICD-10-CM | POA: Diagnosis not present

## 2019-07-19 MED ORDER — DIVALPROEX SODIUM ER 500 MG PO TB24: 1000.0000 mg | ORAL_TABLET | Freq: Every evening | ORAL | 2 refills | Status: DC

## 2019-07-19 MED ORDER — LURASIDONE HCL 40 MG PO TABS: ORAL_TABLET | ORAL | 2 refills | Status: DC
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
## 2019-07-20 ENCOUNTER — Telehealth (HOSPITAL_COMMUNITY): Payer: Self-pay | Admitting: *Deleted

## 2019-07-20 ENCOUNTER — Other Ambulatory Visit (HOSPITAL_COMMUNITY): Payer: Self-pay | Admitting: *Deleted

## 2019-07-20 DIAGNOSIS — F3130 Bipolar disorder, current episode depressed, mild or moderate severity, unspecified: Secondary | ICD-10-CM

## 2019-07-20 NOTE — Telephone Encounter (Signed)
Writer spoke with pt to inform that lab order sent to [place] given address. Pt verbalizes understanding.will go Monday the 21st.

## 2019-07-26 LAB — VALPROIC ACID LEVEL: Valproic Acid Lvl: 72 ug/mL (ref 50–100)

## 2019-07-30 ENCOUNTER — Telehealth: Payer: Self-pay | Admitting: Internal Medicine

## 2019-07-30 ENCOUNTER — Encounter: Payer: Self-pay | Admitting: Internal Medicine

## 2019-07-30 NOTE — Telephone Encounter (Signed)
Patient called today to schedule appointment She stated she had a virtual visit in may with Dr Glori Bickers because she was having sick symptoms. Patient stated she is now feeling bad again and coughing. She wanted to know if she should go be covid tested  Patient stated she is wanting face to face appointment, advised that with her symptoms we would need to start with virtual. Patient said she is not doing that, and hung up the phone on me.   FYI

## 2019-07-30 NOTE — Telephone Encounter (Signed)
Noted  

## 2019-07-31 NOTE — Telephone Encounter (Signed)
Ok to see in person. Please schedule

## 2019-08-01 NOTE — Telephone Encounter (Signed)
Called patient. Phone was answered and then hung up

## 2019-08-04 ENCOUNTER — Other Ambulatory Visit: Payer: Self-pay | Admitting: Internal Medicine

## 2019-09-18 ENCOUNTER — Telehealth (HOSPITAL_COMMUNITY): Payer: BC Managed Care – PPO | Admitting: Psychiatry

## 2019-09-18 ENCOUNTER — Other Ambulatory Visit: Payer: Self-pay

## 2019-09-18 ENCOUNTER — Other Ambulatory Visit (HOSPITAL_COMMUNITY): Payer: Self-pay | Admitting: Psychiatry

## 2019-09-18 MED ORDER — LURASIDONE HCL 40 MG PO TABS: ORAL_TABLET | ORAL | 2 refills | Status: DC

## 2019-12-01 ENCOUNTER — Emergency Department
Admission: EM | Admit: 2019-12-01 | Discharge: 2019-12-01 | Disposition: A | Discharge status: Home / Self Care | Payer: BC Managed Care – PPO | Attending: Emergency Medicine | Admitting: Emergency Medicine

## 2019-12-01 ENCOUNTER — Other Ambulatory Visit: Payer: Self-pay

## 2019-12-01 DIAGNOSIS — Z5321 Procedure and treatment not carried out due to patient leaving prior to being seen by health care provider: Secondary | ICD-10-CM | POA: Insufficient documentation

## 2019-12-01 DIAGNOSIS — R0981 Nasal congestion: Secondary | ICD-10-CM | POA: Diagnosis not present

## 2019-12-01 DIAGNOSIS — R439 Unspecified disturbances of smell and taste: Secondary | ICD-10-CM | POA: Diagnosis not present

## 2019-12-01 DIAGNOSIS — R52 Pain, unspecified: Secondary | ICD-10-CM | POA: Insufficient documentation

## 2019-12-01 DIAGNOSIS — R059 Cough, unspecified: Secondary | ICD-10-CM | POA: Diagnosis not present

## 2019-12-01 NOTE — ED Triage Notes (Addendum)
Pt c/o generalized body aches, loss of taste and smell, cough, runny nose x3 days Pt is addicted to crack and has been clean x30 days

## 2019-12-02 ENCOUNTER — Emergency Department (HOSPITAL_COMMUNITY)
Admission: EM | Admit: 2019-12-02 | Discharge: 2019-12-02 | Disposition: A | Discharge status: Home / Self Care | Payer: BC Managed Care – PPO | Attending: Emergency Medicine | Admitting: Emergency Medicine

## 2019-12-02 ENCOUNTER — Encounter: Payer: Self-pay | Admitting: Internal Medicine

## 2019-12-02 ENCOUNTER — Emergency Department (HOSPITAL_COMMUNITY): Payer: BC Managed Care – PPO

## 2019-12-02 ENCOUNTER — Encounter (HOSPITAL_COMMUNITY): Payer: Self-pay | Admitting: Emergency Medicine

## 2019-12-02 ENCOUNTER — Other Ambulatory Visit: Payer: Self-pay

## 2019-12-02 DIAGNOSIS — R0789 Other chest pain: Secondary | ICD-10-CM | POA: Insufficient documentation

## 2019-12-02 DIAGNOSIS — R6883 Chills (without fever): Secondary | ICD-10-CM | POA: Insufficient documentation

## 2019-12-02 DIAGNOSIS — Z87891 Personal history of nicotine dependence: Secondary | ICD-10-CM | POA: Diagnosis not present

## 2019-12-02 DIAGNOSIS — R5381 Other malaise: Secondary | ICD-10-CM | POA: Diagnosis present

## 2019-12-02 DIAGNOSIS — M791 Myalgia, unspecified site: Secondary | ICD-10-CM | POA: Diagnosis not present

## 2019-12-02 DIAGNOSIS — R059 Cough, unspecified: Secondary | ICD-10-CM | POA: Diagnosis not present

## 2019-12-02 DIAGNOSIS — R69 Illness, unspecified: Secondary | ICD-10-CM | POA: Diagnosis not present

## 2019-12-02 DIAGNOSIS — R430 Anosmia: Secondary | ICD-10-CM | POA: Insufficient documentation

## 2019-12-02 DIAGNOSIS — Z20822 Contact with and (suspected) exposure to covid-19: Secondary | ICD-10-CM | POA: Insufficient documentation

## 2019-12-02 DIAGNOSIS — Z79899 Other long term (current) drug therapy: Secondary | ICD-10-CM | POA: Insufficient documentation

## 2019-12-02 DIAGNOSIS — R432 Parageusia: Secondary | ICD-10-CM | POA: Insufficient documentation

## 2019-12-02 DIAGNOSIS — E039 Hypothyroidism, unspecified: Secondary | ICD-10-CM | POA: Diagnosis not present

## 2019-12-02 DIAGNOSIS — J111 Influenza due to unidentified influenza virus with other respiratory manifestations: Secondary | ICD-10-CM

## 2019-12-02 LAB — URINALYSIS, ROUTINE W REFLEX MICROSCOPIC
Bilirubin Urine: NEGATIVE
Glucose, UA: NEGATIVE mg/dL
Hgb urine dipstick: NEGATIVE
Ketones, ur: NEGATIVE mg/dL
Leukocytes,Ua: NEGATIVE
Nitrite: NEGATIVE
Protein, ur: NEGATIVE mg/dL
Specific Gravity, Urine: 1.003 — ABNORMAL LOW (ref 1.005–1.030)
pH: 8 (ref 5.0–8.0)

## 2019-12-02 LAB — CBC
HCT: 48.8 % — ABNORMAL HIGH (ref 36.0–46.0)
Hemoglobin: 16.2 g/dL — ABNORMAL HIGH (ref 12.0–15.0)
MCH: 30.5 pg (ref 26.0–34.0)
MCHC: 33.2 g/dL (ref 30.0–36.0)
MCV: 91.9 fL (ref 80.0–100.0)
Platelets: 227 10*3/uL (ref 150–400)
RBC: 5.31 MIL/uL — ABNORMAL HIGH (ref 3.87–5.11)
RDW: 13.3 % (ref 11.5–15.5)
WBC: 7.2 10*3/uL (ref 4.0–10.5)
nRBC: 0 % (ref 0.0–0.2)

## 2019-12-02 LAB — RESPIRATORY PANEL BY RT PCR (FLU A&B, COVID)
Influenza A by PCR: NEGATIVE
Influenza B by PCR: NEGATIVE
SARS Coronavirus 2 by RT PCR: NEGATIVE

## 2019-12-02 LAB — I-STAT BETA HCG BLOOD, ED (MC, WL, AP ONLY): I-stat hCG, quantitative: 8.6 m[IU]/mL — ABNORMAL HIGH (ref <5)

## 2019-12-02 LAB — BASIC METABOLIC PANEL
Anion gap: 11 (ref 5–15)
BUN: 11 mg/dL (ref 6–20)
CO2: 24 mmol/L (ref 22–32)
Calcium: 9.4 mg/dL (ref 8.9–10.3)
Chloride: 104 mmol/L (ref 98–111)
Creatinine, Ser: 0.58 mg/dL (ref 0.44–1.00)
GFR, Estimated: >60 mL/min (ref >60)
Glucose, Bld: 95 mg/dL (ref 70–99)
Potassium: 3.9 mmol/L (ref 3.5–5.1)
Sodium: 139 mmol/L (ref 135–145)

## 2019-12-02 LAB — GROUP A STREP BY PCR: Group A Strep by PCR: NOT DETECTED

## 2019-12-02 LAB — TROPONIN I (HIGH SENSITIVITY): Troponin I (High Sensitivity): 3 ng/L (ref <18)

## 2019-12-02 MED ORDER — IBUPROFEN 400 MG PO TABS
400.0000 mg | ORAL_TABLET | Freq: Once | ORAL | Status: AC
  Administered 2019-12-02: 400 mg via ORAL
  Filled 2019-12-02: qty 1

## 2019-12-02 NOTE — ED Triage Notes (Signed)
Pt here from home. Complaint of chest pain, body aches and chills x5 days. VSS. NAD.
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
## 2019-12-02 NOTE — Discharge Instructions (Signed)
Your Covid test should be back later this evening.  Check your MyChart for results.  Stay isolated at home until you see the results.  Take over-the-counter medications such as Tylenol or ibuprofen as needed for aches and pains.

## 2019-12-02 NOTE — ED Notes (Signed)
Pt verbalized understanding of discharge paperwork and follow-up care.  °

## 2019-12-14 IMAGING — CR DG CHEST 2V
2 series · 2 of 2 positions shown · non-contrast
Comparison: 06/25/2015

CLINICAL DATA: Chest pain from illicit drug use

EXAM:
CHEST - 2 VIEW

[w chest pa]
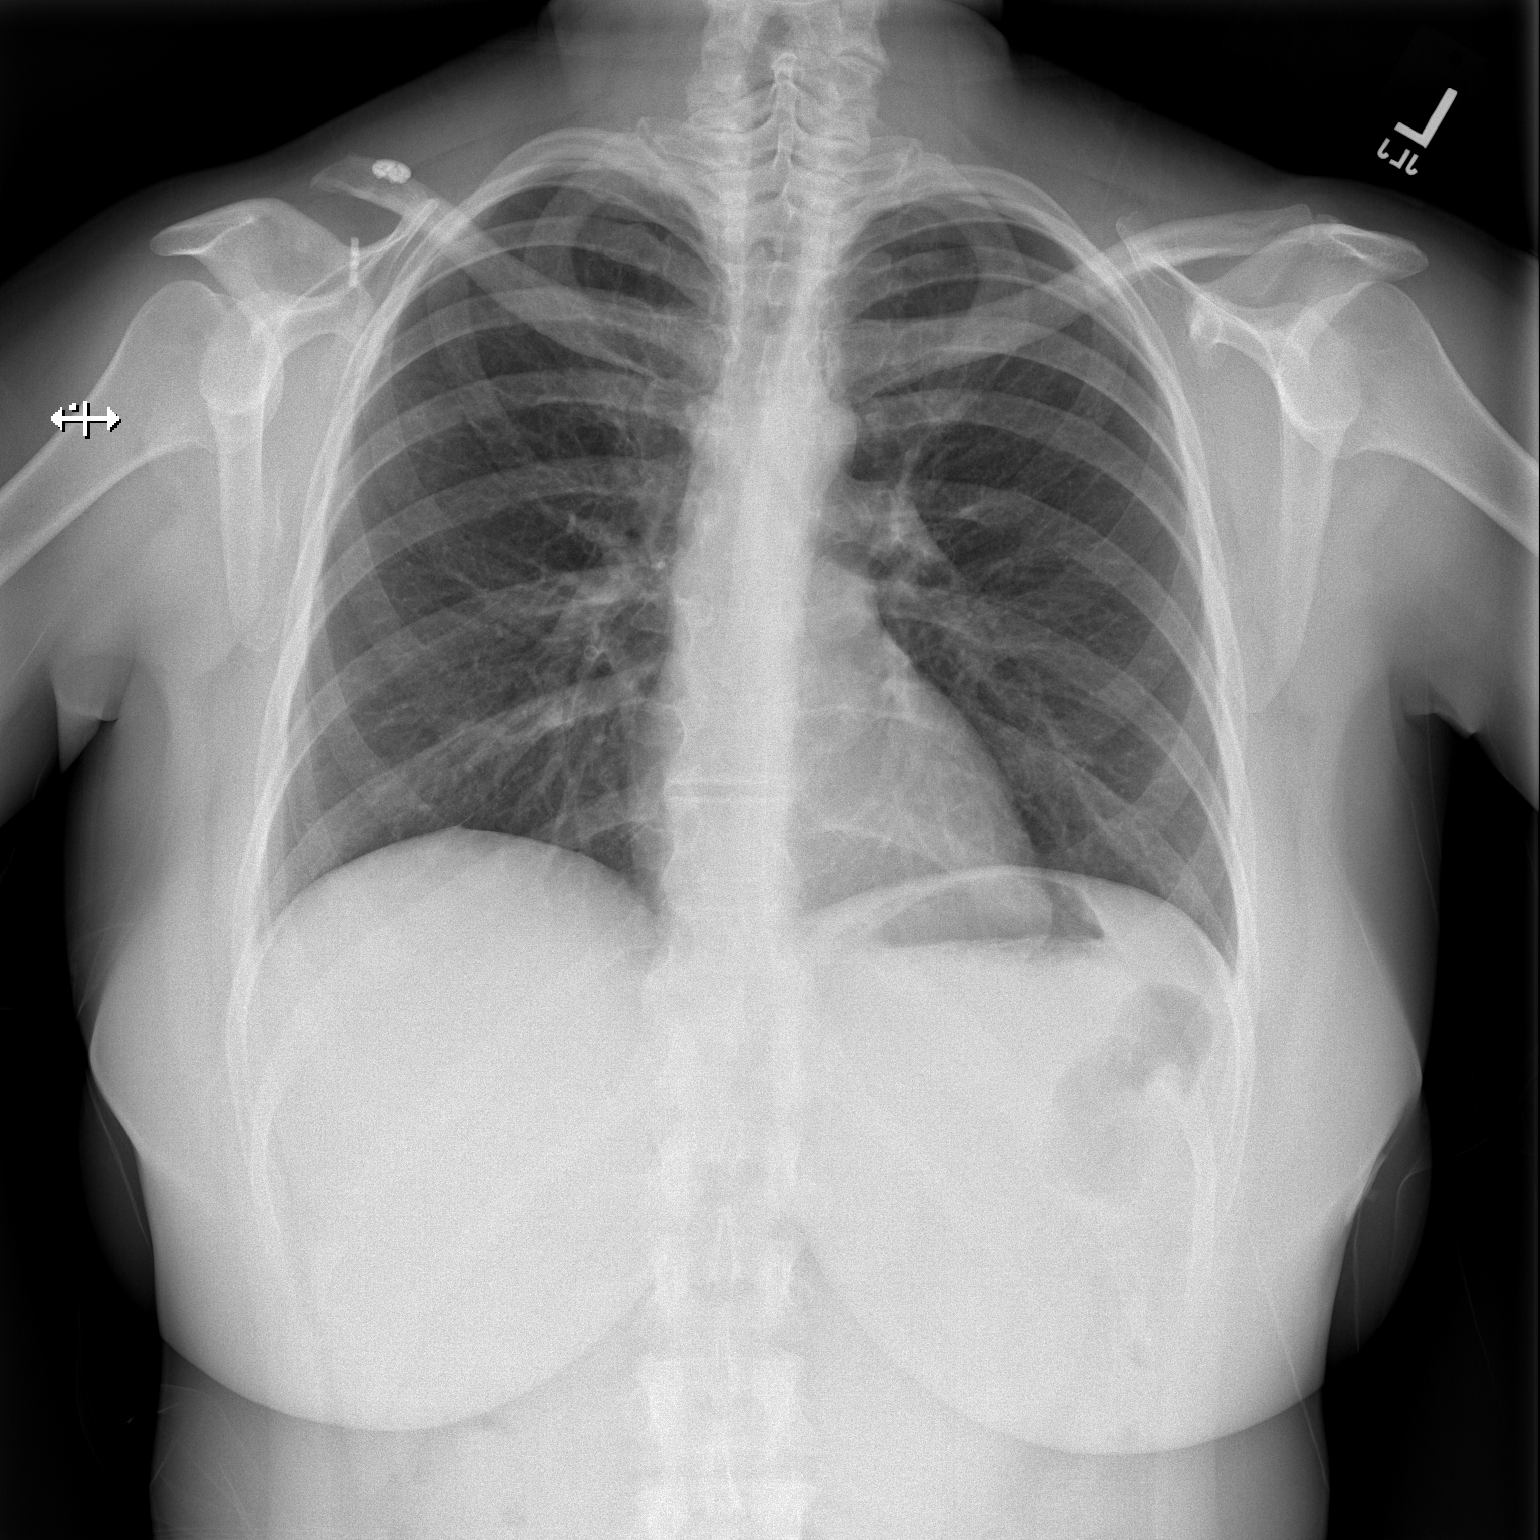

[w chest lat]
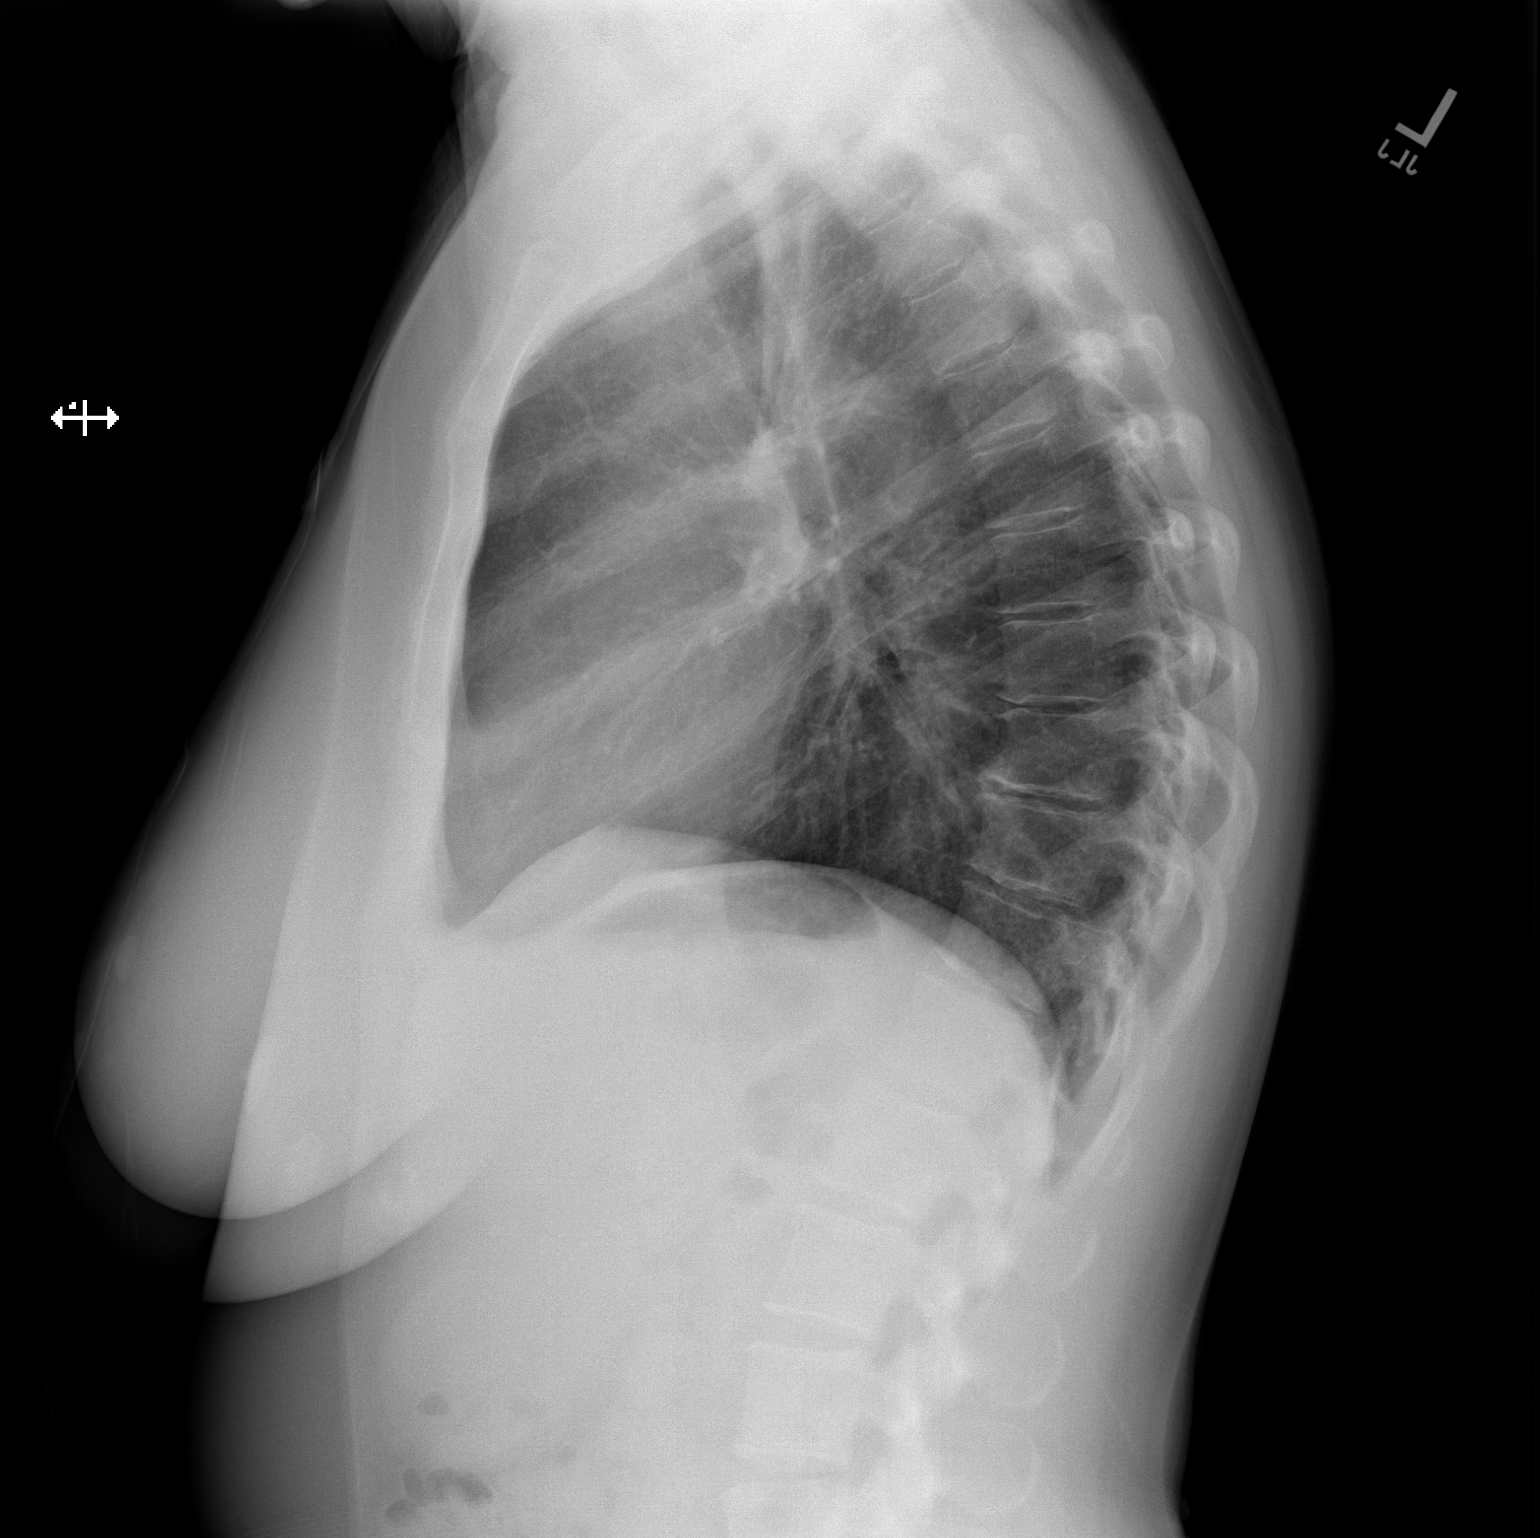

[2 of 2 positions shown; findings below may reference images not displayed]

FINDINGS: The heart size and mediastinal contours are within normal limits.
Both lungs are clear. The visualized skeletal structures are
unremarkable.
IMPRESSION: No active cardiopulmonary disease.

## 2019-12-20 ENCOUNTER — Ambulatory Visit (INDEPENDENT_AMBULATORY_CARE_PROVIDER_SITE_OTHER): Payer: BC Managed Care – PPO | Admitting: Internal Medicine

## 2019-12-20 ENCOUNTER — Other Ambulatory Visit: Payer: Self-pay

## 2019-12-20 VITALS — BP 118/72 | HR 82 | Temp 97.3°F | Wt 148.0 lb

## 2019-12-20 DIAGNOSIS — H918X3 Other specified hearing loss, bilateral: Secondary | ICD-10-CM | POA: Diagnosis not present

## 2019-12-20 MED ORDER — LEVOTHYROXINE SODIUM 50 MCG PO TABS: ORAL_TABLET | ORAL | 0 refills | Status: DC
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
## 2019-12-23 ENCOUNTER — Encounter: Payer: Self-pay | Admitting: Internal Medicine

## 2019-12-23 NOTE — Patient Instructions (Signed)
Hearing Loss Hearing loss is a partial or total loss of the ability to hear. This can be temporary or permanent, and it can happen in one or both ears. Medical care is necessary to treat hearing loss properly and to prevent the condition from getting worse. Your hearing may partially or completely come back, depending on what caused your hearing loss and how severe it is. In some cases, hearing loss is permanent. What are the causes? Common causes of hearing loss include:  Too much wax in the ear canal.  Infection of the ear canal or middle ear.  Fluid in the middle ear.  Injury to the ear or surrounding area.  An object stuck in the ear.  A history of prolonged exposure to loud sounds, such as music. Less common causes of hearing loss include:  Tumors in the ear.  Viral or bacterial infections, such as meningitis.  A hole in the eardrum (perforated eardrum).  Problems with the hearing nerve that sends signals between the brain and the ear.  Certain medicines. What are the signs or symptoms? Symptoms of this condition may include:  Difficulty telling the difference between sounds.  Difficulty following a conversation when there is background noise.  Lack of response to sounds in your environment. This may be most noticeable when you do not respond to startling sounds.  Needing to turn up the volume on the television, radio, or other devices.  Ringing in the ears.  Dizziness. How is this diagnosed? This condition is diagnosed based on:  A physical exam.  A hearing test (audiometry). The audiometry test will be performed by a hearing specialist (audiologist). You may also be referred to an ear, nose, and throat (ENT) specialist (otolaryngologist). How is this treated? Treatment for hearing loss may include:  Ear wax removal.  Medicines to treat or prevent infection (antibiotics).  Medicines to reduce inflammation (corticosteroids).  Hearing aids for hearing  loss related to nerve damage. Follow these instructions at home:  If you were prescribed an antibiotic medicine, take it as told by your health care provider. Do not stop taking the antibiotic even if you start to feel better.  Take over-the-counter and prescription medicines only as told by your health care provider.  Avoid loud noises.  Return to your normal activities as told by your health care provider. Ask your health care provider what activities are safe for you.  Keep all follow-up visits as told by your health care provider. This is important. Contact a health care provider if:  You feel dizzy.  You develop new symptoms.  You vomit or feel nauseous.  You have a fever. Get help right away if:  You develop sudden changes in your vision.  You have severe ear pain.  You have new or increased weakness.  You have a severe headache. Summary  Hearing loss is a decreased ability to hear sounds around you. It can be temporary or permanent.  Treatment will depend on the cause of your hearing loss. It may include ear wax removal, medicines, or a hearing aid.  Your hearing may partially or completely come back, depending on what caused your hearing loss and how severe it is.  Keep all follow-up visits as told by your health care provider. This is important. This information is not intended to replace advice given to you by your health care provider. Make sure you discuss any questions you have with your health care provider. Document Revised: 10/18/2017 Document Reviewed: 10/18/2017 Elsevier Patient Education  2020 [employer]  

## 2020-01-02 ENCOUNTER — Encounter: Payer: Self-pay | Admitting: Internal Medicine

## 2020-01-04 ENCOUNTER — Ambulatory Visit: Payer: BC Managed Care – PPO | Admitting: Audiologist

## 2020-01-08 ENCOUNTER — Ambulatory Visit: Payer: BC Managed Care – PPO | Admitting: Family Medicine

## 2020-01-08 ENCOUNTER — Other Ambulatory Visit: Payer: Self-pay

## 2020-01-08 ENCOUNTER — Encounter: Payer: Self-pay | Admitting: Internal Medicine

## 2020-01-08 ENCOUNTER — Encounter: Payer: Self-pay | Admitting: Emergency Medicine

## 2020-01-08 ENCOUNTER — Ambulatory Visit
Admission: EM | Admit: 2020-01-08 | Discharge: 2020-01-08 | Disposition: A | Discharge status: Home / Self Care | Payer: BC Managed Care – PPO | Attending: Emergency Medicine | Admitting: Emergency Medicine

## 2020-01-08 DIAGNOSIS — R1010 Upper abdominal pain, unspecified: Secondary | ICD-10-CM | POA: Diagnosis not present

## 2020-01-08 DIAGNOSIS — J019 Acute sinusitis, unspecified: Secondary | ICD-10-CM | POA: Diagnosis not present

## 2020-01-08 MED ORDER — BENZONATATE 200 MG PO CAPS: 200.0000 mg | ORAL_CAPSULE | Freq: Three times a day (TID) | ORAL | 0 refills | Status: AC | PRN

## 2020-01-08 MED ORDER — AMOXICILLIN-POT CLAVULANATE 875-125 MG PO TABS: 1.0000 | ORAL_TABLET | Freq: Two times a day (BID) | ORAL | 0 refills | Status: AC

## 2020-01-08 MED ORDER — OMEPRAZOLE 20 MG PO CPDR: 20.0000 mg | DELAYED_RELEASE_CAPSULE | Freq: Two times a day (BID) | ORAL | 0 refills | Status: DC

## 2020-01-08 MED ORDER — ONDANSETRON 4 MG PO TBDP: 4.0000 mg | ORAL_TABLET | Freq: Three times a day (TID) | ORAL | 0 refills | Status: DC | PRN

## 2020-01-08 NOTE — Telephone Encounter (Signed)
I spoke with pt and cancelled both appts with pts permission for abd pain and blood in stool on 01/14/20 and respiratory issues for video visit on 01/09/20. ptis going now to [facility] on [address] and have abd issues with blood in stool and respiratory symptoms cked. FYI to Avie Echevaria NP.

## 2020-01-08 NOTE — Telephone Encounter (Signed)
[facility] - Client TELEPHONE ADVICE RECORD [facility] Patient Name: Lindsey Davenport Gender: Female DOB: [birth year] Age: 39 Y 10 M 25 D Return Phone Number: [phone] (Primary) Address: City/State/Zip: [place] [address] Client [facility] - Client Client Site [facility] - Day Physician Webb Silversmith - NP Contact Type Call Who Is Calling Patient / Member / Family / Caregiver Call Type Triage / Clinical Relationship To Patient Self Return Phone Number [phone] (Primary) Chief Complaint Sore Throat Reason for Call Symptomatic / Request for [place] states she is coughing has a sore throat and has a slight temperature. No other sx. Translation No Nurse Assessment Nurse: Thad Ranger, RN, Denise Date/Time (Eastern Time): 01/08/2020 3:09:03 PM Confirm and document reason for call. If symptomatic, describe symptoms. ---Caller states she is coughing has a sore throat and has a slight temperature of 100.4, ears painful, HA, and nasal congestion. Covid test neg yesterday. Does the patient have any new or worsening symptoms? ---Yes Will a triage be completed? ---Yes Related visit to physician within the last 2 weeks? ---Yes Does the PT have any chronic conditions? (i.e. diabetes, asthma, this includes High risk factors for pregnancy, etc.) ---No Is the patient pregnant or possibly pregnant? (Ask all females between the ages of 33-55) ---No Is this a behavioral health or substance abuse call? ---No Guidelines Guideline Title Affirmed Question Affirmed Notes Nurse Date/Time (Eastern Time) Sore Throat [1] Pus on tonsils (back of throat) AND [2] fever AND [3] swollen neck lymph nodes ("glands") Carmon, RN, Denise 01/08/2020 3:10:59 PM Disp. Time Eilene Ghazi Time) Disposition Final User 01/08/2020 2:25:46 PM Attempt made - message left Carmon, RN, Langley Gauss 01/08/2020 2:40:21  PM FINAL ATTEMPT MADE - no message left Thad Ranger, RN, Langley Gauss 01/08/2020 2:40:28 PM Send to RN Final Attempt Christin Bach, RN, Denise 01/08/2020 2:42:35 PM Send To RN Blackburn, RN, Kieler 01/08/2020 3:14:13 PM See PCP within 24 Hours Yes Carmon, RN, Langley Gauss PLEASE NOTE: All timestamps contained within this report are represented as [place] Standard Time. CONFIDENTIALTY NOTICE: This fax transmission is intended only for the addressee. It contains information that is legally privileged, confidential or otherwise protected from use or disclosure. If you are not the intended recipient, you are strictly prohibited from reviewing, disclosing, copying using or disseminating any of this information or taking any action in reliance on or regarding this information. If you have received this fax in error, please notify us immediately by telephone so that we can arrange for its return to [place]. Phone: [phone], Toll-Free: [phone], Fax: [phone] Page: 2 of 2 Call Id: [id number] [place] Disagree/Comply Comply Caller Understands Yes PreDisposition Call Doctor Care Advice Given Per Guideline SEE PCP WITHIN 24 HOURS: SORE THROAT: * Here are some simple things you can do to treat and reduce sore throat pain. * Sip warm chicken broth or apple juice. * Suck on hard candy or an over-the-counter throat lozenge. * Gargle with warm salt water four times a day. To make salt water, put 1/2 teaspoon of salt in 8 oz (240 ml) of warm water. * Avoid cigarette smoke. PAIN AND FEVER MEDICINES: * IBUPROFEN (E.G., MOTRIN, ADVIL): Take 400 mg (two 200 mg pills) by mouth every 6 hours. The most you should take each day is 1,200 mg (six 200 mg pills), unless your doctor has told you to take more. SOFT DIET: * Eat a soft diet. * Cold drinks, popsicles, and milk shakes are especially  good. Avoid citrus fruits. CALL BACK IF: * You become worse CARE ADVICE given per Sore Throat (Adult) guideline. Comments User: Romeo Apple,  RN Date/Time Eilene Ghazi Time): 01/08/2020 2:20:57 PM No VMB set up Referrals REFERRED TO PCP OFFICE

## 2020-01-08 NOTE — ED Triage Notes (Signed)
Pt sts URI sx with negative covid test 2 days ago; pt also sts some abd pain and blood in stool that pt sts feels is from her diverticulitis

## 2020-01-08 NOTE — Discharge Instructions (Signed)
Begin Augmentin twice daily for the next week for sinus infection May continue over-the-counter cough and congestion medicine May use Tessalon for cough as needed Begin omeprazole twice daily to help with any underlying acid contributing to abdominal discomfort Zofran for nausea Follow-up with primary care/gastroenterology for further evaluation of abdominal pain and bleeding Please go to emergency room if symptoms progressing or worsening
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
## 2020-01-09 ENCOUNTER — Telehealth: Payer: BC Managed Care – PPO | Admitting: Internal Medicine

## 2020-01-09 NOTE — Telephone Encounter (Signed)
Agree with advice given

## 2020-01-10 ENCOUNTER — Encounter: Payer: Self-pay | Admitting: Internal Medicine

## 2020-01-14 ENCOUNTER — Ambulatory Visit: Payer: BC Managed Care – PPO | Admitting: Internal Medicine

## 2020-01-16 ENCOUNTER — Ambulatory Visit: Payer: BC Managed Care – PPO | Attending: Internal Medicine | Admitting: Audiologist

## 2020-01-16 ENCOUNTER — Other Ambulatory Visit: Payer: Self-pay

## 2020-01-16 DIAGNOSIS — H9313 Tinnitus, bilateral: Secondary | ICD-10-CM

## 2020-01-16 DIAGNOSIS — H903 Sensorineural hearing loss, bilateral: Secondary | ICD-10-CM
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
## 2020-01-17 ENCOUNTER — Encounter: Payer: Self-pay | Admitting: Internal Medicine

## 2020-01-17 ENCOUNTER — Ambulatory Visit (INDEPENDENT_AMBULATORY_CARE_PROVIDER_SITE_OTHER): Payer: BC Managed Care – PPO | Admitting: Internal Medicine

## 2020-01-17 VITALS — BP 118/70 | HR 78 | Temp 97.0°F | Wt 152.0 lb

## 2020-01-17 DIAGNOSIS — M25522 Pain in left elbow: Secondary | ICD-10-CM

## 2020-01-17 DIAGNOSIS — G8929 Other chronic pain: Secondary | ICD-10-CM | POA: Diagnosis not present

## 2020-01-17 DIAGNOSIS — E039 Hypothyroidism, unspecified: Secondary | ICD-10-CM

## 2020-01-17 DIAGNOSIS — M79601 Pain in right arm: Secondary | ICD-10-CM

## 2020-01-17 DIAGNOSIS — R202 Paresthesia of skin: Secondary | ICD-10-CM

## 2020-01-17 DIAGNOSIS — M79602 Pain in left arm: Secondary | ICD-10-CM

## 2020-01-17 DIAGNOSIS — Z1159 Encounter for screening for other viral diseases: Secondary | ICD-10-CM

## 2020-01-17 DIAGNOSIS — R700 Elevated erythrocyte sedimentation rate: Secondary | ICD-10-CM

## 2020-01-17 DIAGNOSIS — M25552 Pain in left hip: Secondary | ICD-10-CM

## 2020-01-17 DIAGNOSIS — M542 Cervicalgia: Secondary | ICD-10-CM | POA: Diagnosis not present

## 2020-01-17 DIAGNOSIS — M25551 Pain in right hip: Secondary | ICD-10-CM

## 2020-01-17 DIAGNOSIS — Z114 Encounter for screening for human immunodeficiency virus [HIV]: Secondary | ICD-10-CM

## 2020-01-17 DIAGNOSIS — R768 Other specified abnormal immunological findings in serum: Secondary | ICD-10-CM

## 2020-01-17 LAB — VITAMIN B12: Vitamin B-12: 249 pg/mL (ref 211–911)

## 2020-01-17 LAB — SEDIMENTATION RATE: Sed Rate: 22 mm/hr — ABNORMAL HIGH (ref 0–20)

## 2020-01-17 LAB — COMPREHENSIVE METABOLIC PANEL
ALT: 16 U/L (ref 0–35)
AST: 12 U/L (ref 0–37)
Albumin: 4.6 g/dL (ref 3.5–5.2)
Alkaline Phosphatase: 104 U/L (ref 39–117)
BUN: 12 mg/dL (ref 6–23)
CO2: 27 mEq/L (ref 19–32)
Calcium: 9.8 mg/dL (ref 8.4–10.5)
Chloride: 105 mEq/L (ref 96–112)
Creatinine, Ser: 0.65 mg/dL (ref 0.40–1.20)
GFR: 109.74 mL/min (ref >60.00)
Glucose, Bld: 94 mg/dL (ref 70–99)
Potassium: 4.4 mEq/L (ref 3.5–5.1)
Sodium: 141 mEq/L (ref 135–145)
Total Bilirubin: 0.3 mg/dL (ref 0.2–1.2)
Total Protein: 7.3 g/dL (ref 6.0–8.3)

## 2020-01-17 LAB — CBC
HCT: 44.9 % (ref 36.0–46.0)
Hemoglobin: 15.4 g/dL — ABNORMAL HIGH (ref 12.0–15.0)
MCHC: 34.4 g/dL (ref 30.0–36.0)
MCV: 89.6 fl (ref 78.0–100.0)
Platelets: 296 10*3/uL (ref 150.0–400.0)
RBC: 5 Mil/uL (ref 3.87–5.11)
RDW: 13.9 % (ref 11.5–15.5)
WBC: 10.7 10*3/uL — ABNORMAL HIGH (ref 4.0–10.5)

## 2020-01-17 LAB — TSH: TSH: 0.26 u[IU]/mL — ABNORMAL LOW (ref 0.35–4.50)

## 2020-01-17 LAB — HIGH SENSITIVITY CRP: CRP, High Sensitivity: 1.67 mg/L (ref 0.000–5.000)

## 2020-01-17 LAB — VITAMIN D 25 HYDROXY (VIT D DEFICIENCY, FRACTURES): VITD: 34.25 ng/mL (ref 30.00–100.00)

## 2020-01-17 LAB — T4, FREE: Free T4: 0.83 ng/dL (ref 0.60–1.60)
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
## 2020-01-17 NOTE — Patient Instructions (Signed)
Joint Pain  Joint pain can be caused by many things. It is likely to go away if you follow instructions from your doctor for taking care of yourself at home. Sometimes, you may need more treatment. Follow these instructions at home: Managing pain, stiffness, and swelling   If told, put ice on the painful area. ? Put ice in a plastic bag. ? Place a towel between your skin and the bag. ? Leave the ice on for 20 minutes, 2-3 times a day.  If told, put heat on the painful area. Do this as often as told by your doctor. Use the heat source that your doctor recommends, such as a moist heat pack or a heating pad. ? Place a towel between your skin and the heat source. ? Leave the heat on for 20-30 minutes. ? Take off the heat if your skin gets bright red. This is especially important if you are unable to feel pain, heat, or cold. You may have a greater risk of getting burned.  Move your fingers or toes below the painful joint often. This helps with stiffness and swelling.  If possible, raise (elevate) the painful joint above the level of your heart while you are sitting or lying down. To do this, try putting a few pillows under the painful joint. Activity  Rest the painful joint for as long as told. Do not do things that cause pain or make your pain worse.  Begin exercising or stretching the affected area, as told by your doctor. Ask your doctor what types of exercise are safe for you. If you have an elastic bandage, sling, or splint:  Wear the device as told by your doctor. Take it off only as told by your doctor.  Loosen the device if your fingers or toes below the joint: ? Tingle. ? Lose feeling (get numb). ? Get cold and blue.  Keep the device clean.  Ask your doctor if you should take off the device before bathing. You may need to cover it with a watertight covering when you take a bath or a shower. General instructions  Take over-the-counter and prescription medicines only as told  by your doctor.  Do not use any products that contain nicotine or tobacco. These include cigarettes and e-cigarettes. If you need help quitting, ask your doctor.  Keep all follow-up visits as told by your doctor. This is important. Contact a doctor if:  You have pain that gets worse and does not get better with medicine.  Your joint pain does not get better in 3 days.  You have more bruising or swelling.  You have a fever.  You lose 10 lb (4.5 kg) or more without trying. Get help right away if:  You cannot move the joint.  Your fingers or toes tingle, lose feeling, or get cold and blue.  You have a fever along with a joint that is red, warm, and swollen. Summary  Joint pain can be caused by many things. It often goes away if you follow instructions from your doctor for taking care of yourself at home.  Rest the painful joint for as long as told. Do not do things that cause pain or make your pain worse.  Take over-the-counter and prescription medicines only as told by your doctor. This information is not intended to replace advice given to you by your health care provider. Make sure you discuss any questions you have with your health care provider. Document Revised: 12/31/2016 Document Reviewed: 11/03/2016 Elsevier  Patient Education  [employer].

## 2020-01-18 ENCOUNTER — Encounter: Payer: Self-pay | Admitting: Internal Medicine

## 2020-01-20 MED ORDER — LEVOTHYROXINE SODIUM 25 MCG PO TABS: 25.0000 ug | ORAL_TABLET | Freq: Every day | ORAL | 0 refills | Status: DC

## 2020-01-20 NOTE — Addendum Note (Signed)
Addended by: Jearld Fenton on: 01/20/2020 05:35 PM   Modules accepted: Orders

## 2020-01-22 LAB — ANA: Anti Nuclear Antibody (ANA): POSITIVE — AB

## 2020-01-22 LAB — HEPATITIS C ANTIBODY
Hepatitis C Ab: NONREACTIVE
SIGNAL TO CUT-OFF: 0.01 (ref <1.00)

## 2020-01-22 LAB — RPR: RPR Ser Ql: NONREACTIVE

## 2020-01-22 LAB — TEST AUTHORIZATION

## 2020-01-22 LAB — RHEUMATOID FACTOR: Rheumatoid fact SerPl-aCnc: <14 IU/mL (ref <14)

## 2020-01-22 LAB — HIV ANTIBODY (ROUTINE TESTING W REFLEX): HIV 1&2 Ab, 4th Generation: NONREACTIVE

## 2020-01-22 LAB — ANTI-NUCLEAR AB-TITER (ANA TITER): ANA Titer 1: 1:80 {titer} — ABNORMAL HIGH

## 2020-01-22 NOTE — Addendum Note (Signed)
Addended by: Jearld Fenton on: 01/22/2020 07:45 AM   Modules accepted: Orders

## 2020-01-29 ENCOUNTER — Telehealth: Payer: Self-pay

## 2020-01-29 DIAGNOSIS — H9193 Unspecified hearing loss, bilateral: Secondary | ICD-10-CM

## 2020-01-29 NOTE — Telephone Encounter (Signed)
Spoke with patient today in regards to the referral that was suggested by Audiologist at [facility] (notes in epic).  Please place referral for the below, per OV note from audiologist,  if you agree. Thank you  1. Lorre Munroe, NP please send referral for ENT Evaluation and Cochlear Implant consult to [facility] 1.  Telephone: [phone] (clinic) Fax #:  [phone]

## 2020-01-29 NOTE — Telephone Encounter (Signed)
Referral placed.

## 2020-01-30 NOTE — Telephone Encounter (Signed)
Noted. Referral sent over.

## 2020-02-07 ENCOUNTER — Telehealth: Payer: Self-pay | Admitting: Internal Medicine

## 2020-02-07 NOTE — Telephone Encounter (Signed)
Pt ci wanted to know if they resent the referral due to they had but the wrong code on there and she needs a referral for the consultation

## 2020-02-08 NOTE — Telephone Encounter (Signed)
Called patient to get clarification on the message. Patient states that the referral needs to be resent with the reason/diagnoses for Cochlear implant consultation instead of just hearing loss since patient already had hearing screening with the [facility] office. Advised patient I would let Rollene Fare know and will call patient back once I have this referral order so she can call and schedule her appointment.  (this notation is for me-Need to call [facility] office Monday to verify their fax number, they were closed today [phone])

## 2020-02-09 NOTE — Telephone Encounter (Signed)
What diagnosis do I need to use?

## 2020-02-12 ENCOUNTER — Other Ambulatory Visit: Payer: Self-pay | Admitting: Internal Medicine

## 2020-02-12 DIAGNOSIS — E039 Hypothyroidism, unspecified: Secondary | ICD-10-CM

## 2020-02-14 ENCOUNTER — Ambulatory Visit (INDEPENDENT_AMBULATORY_CARE_PROVIDER_SITE_OTHER): Payer: Self-pay | Admitting: Internal Medicine

## 2020-02-14 ENCOUNTER — Encounter: Payer: Self-pay | Admitting: Internal Medicine

## 2020-02-14 ENCOUNTER — Other Ambulatory Visit: Payer: Self-pay

## 2020-02-14 VITALS — BP 110/72 | HR 74 | Temp 97.7°F | Ht 62.5 in | Wt 155.0 lb

## 2020-02-14 DIAGNOSIS — Z0001 Encounter for general adult medical examination with abnormal findings: Secondary | ICD-10-CM

## 2020-02-14 DIAGNOSIS — Z23 Encounter for immunization: Secondary | ICD-10-CM

## 2020-02-14 DIAGNOSIS — Z0000 Encounter for general adult medical examination without abnormal findings: Secondary | ICD-10-CM

## 2020-02-14 DIAGNOSIS — F3130 Bipolar disorder, current episode depressed, mild or moderate severity, unspecified: Secondary | ICD-10-CM

## 2020-02-14 DIAGNOSIS — K219 Gastro-esophageal reflux disease without esophagitis: Secondary | ICD-10-CM

## 2020-02-14 DIAGNOSIS — E039 Hypothyroidism, unspecified: Secondary | ICD-10-CM

## 2020-02-14 DIAGNOSIS — E782 Mixed hyperlipidemia: Secondary | ICD-10-CM

## 2020-02-14 NOTE — Assessment & Plan Note (Signed)
Stabilized off meds Support offered We will monitor
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
## 2020-02-14 NOTE — Addendum Note (Signed)
Addended by: Lurlean Nanny on: 02/14/2020 04:46 PM   Modules accepted: Orders

## 2020-02-14 NOTE — Assessment & Plan Note (Signed)
TSH reviewed Continue Levothyroxine at currently scheduled dose

## 2020-02-14 NOTE — Assessment & Plan Note (Signed)
C-Met and lipid profile today °Encouraged her to consume a low-fat diet °

## 2020-02-14 NOTE — Assessment & Plan Note (Signed)
Continue Omeprazole Will monitor 

## 2020-02-14 NOTE — Patient Instructions (Signed)
Health Maintenance, Female Adopting a healthy lifestyle and getting preventive care are important in promoting health and wellness. Ask your health care provider about:  The right schedule for you to have regular tests and exams.  Things you can do on your own to prevent diseases and keep yourself healthy. What should I know about diet, weight, and exercise? Eat a healthy diet  Eat a diet that includes plenty of vegetables, fruits, low-fat dairy products, and lean protein.  Do not eat a lot of foods that are high in solid fats, added sugars, or sodium.   Maintain a healthy weight Body mass index (BMI) is used to identify weight problems. It estimates body fat based on height and weight. Your health care provider can help determine your BMI and help you achieve or maintain a healthy weight. Get regular exercise Get regular exercise. This is one of the most important things you can do for your health. Most adults should:  Exercise for at least 150 minutes each week. The exercise should increase your heart rate and make you sweat (moderate-intensity exercise).  Do strengthening exercises at least twice a week. This is in addition to the moderate-intensity exercise.  Spend less time sitting. Even light physical activity can be beneficial. Watch cholesterol and blood lipids Have your blood tested for lipids and cholesterol at 41 years of age, then have this test every 5 years. Have your cholesterol levels checked more often if:  Your lipid or cholesterol levels are high.  You are older than 40 years of age.  You are at high risk for heart disease. What should I know about cancer screening? Depending on your health history and family history, you may need to have cancer screening at various ages. This may include screening for:  Breast cancer.  Cervical cancer.  Colorectal cancer.  Skin cancer.  Lung cancer. What should I know about heart disease, diabetes, and high blood  pressure? Blood pressure and heart disease  High blood pressure causes heart disease and increases the risk of stroke. This is more likely to develop in people who have high blood pressure readings, are of African descent, or are overweight.  Have your blood pressure checked: ? Every 3-5 years if you are 18-39 years of age. ? Every year if you are 40 years old or older. Diabetes Have regular diabetes screenings. This checks your fasting blood sugar level. Have the screening done:  Once every three years after age 40 if you are at a normal weight and have a low risk for diabetes.  More often and at a younger age if you are overweight or have a high risk for diabetes. What should I know about preventing infection? Hepatitis B If you have a higher risk for hepatitis B, you should be screened for this virus. Talk with your health care provider to find out if you are at risk for hepatitis B infection. Hepatitis C Testing is recommended for:  Everyone born from 1945 through 1965.  Anyone with known risk factors for hepatitis C. Sexually transmitted infections (STIs)  Get screened for STIs, including gonorrhea and chlamydia, if: ? You are sexually active and are younger than 41 years of age. ? You are older than 41 years of age and your health care provider tells you that you are at risk for this type of infection. ? Your sexual activity has changed since you were last screened, and you are at increased risk for chlamydia or gonorrhea. Ask your health care provider   if you are at risk.  Ask your health care provider about whether you are at high risk for HIV. Your health care provider may recommend a prescription medicine to help prevent HIV infection. If you choose to take medicine to prevent HIV, you should first get tested for HIV. You should then be tested every 3 months for as long as you are taking the medicine. Pregnancy  If you are about to stop having your period (premenopausal) and  you may become pregnant, seek counseling before you get pregnant.  Take 400 to 800 micrograms (mcg) of folic acid every day if you become pregnant.  Ask for birth control (contraception) if you want to prevent pregnancy. Osteoporosis and menopause Osteoporosis is a disease in which the bones lose minerals and strength with aging. This can result in bone fractures. If you are 65 years old or older, or if you are at risk for osteoporosis and fractures, ask your health care provider if you should:  Be screened for bone loss.  Take a calcium or vitamin D supplement to lower your risk of fractures.  Be given hormone replacement therapy (HRT) to treat symptoms of menopause. Follow these instructions at home: Lifestyle  Do not use any products that contain nicotine or tobacco, such as cigarettes, e-cigarettes, and chewing tobacco. If you need help quitting, ask your health care provider.  Do not use street drugs.  Do not share needles.  Ask your health care provider for help if you need support or information about quitting drugs. Alcohol use  Do not drink alcohol if: ? Your health care provider tells you not to drink. ? You are pregnant, may be pregnant, or are planning to become pregnant.  If you drink alcohol: ? Limit how much you use to 0-1 drink a day. ? Limit intake if you are breastfeeding.  Be aware of how much alcohol is in your drink. In the [place] one drink equals one 12 oz bottle of beer (355 mL), one 5 oz glass of wine (148 mL), or one 1 oz glass of hard liquor (44 mL). General instructions  Schedule regular health, dental, and eye exams.  Stay current with your vaccines.  Tell your health care provider if: ? You often feel depressed. ? You have ever been abused or do not feel safe at home. Summary  Adopting a healthy lifestyle and getting preventive care are important in promoting health and wellness.  Follow your health care provider's instructions about healthy  diet, exercising, and getting tested or screened for diseases.  Follow your health care provider's instructions on monitoring your cholesterol and blood pressure. This information is not intended to replace advice given to you by your health care provider. Make sure you discuss any questions you have with your health care provider. Document Revised: 01/11/2018 Document Reviewed: 01/11/2018 Elsevier Patient Education  2021 [employer]  

## 2020-02-15 ENCOUNTER — Telehealth: Payer: Self-pay | Admitting: Internal Medicine

## 2020-02-15 LAB — LIPID PANEL
Cholesterol: 193 mg/dL (ref 0–200)
HDL: 42.5 mg/dL (ref >39.00)
LDL Cholesterol: 113 mg/dL — ABNORMAL HIGH (ref 0–99)
NonHDL: 150.32
Total CHOL/HDL Ratio: 5
Triglycerides: 187 mg/dL — ABNORMAL HIGH (ref 0.0–149.0)
VLDL: 37.4 mg/dL (ref 0.0–40.0)

## 2020-02-15 LAB — TSH: TSH: 0.7 u[IU]/mL (ref 0.35–4.50)

## 2020-02-15 NOTE — Telephone Encounter (Signed)
Pt has a copy and reports she ill tell them that she can bring the results with her to the appt

## 2020-02-15 NOTE — Telephone Encounter (Addendum)
I tried researching this and the only code that I could find was   Z96.21 - a billable diagnosis code used to specify a medical diagnosis of cochlear implant status; Presence of external hearing-aid. Presence (of) ear implant Z96.20 cochlear implant Z96.21  Z97.4 is also another option -  Presence of external hearing-aid  Not sure how these come up in EPIC  I couldn't really find any other code to use unless you use a something along the lines of eval and treat. I do not see why Hearing Loss would not suffice as that is the main purpose of the implant.

## 2020-02-15 NOTE — Addendum Note (Signed)
Addended by: Ellamae Sia on: 02/15/2020 08:28 AM   Modules accepted: Orders

## 2020-02-15 NOTE — Telephone Encounter (Signed)
Patient called stating that she needs a copy of her hearing test results. Please advise patient EM

## 2020-02-16 ENCOUNTER — Encounter: Payer: Self-pay | Admitting: Internal Medicine

## 2020-02-17 NOTE — Telephone Encounter (Signed)
Not sure either of these would work because she currently does not have a hearing aid or cochlear implant. Is there a number for their referral coordinator I could call?

## 2020-02-19 NOTE — Telephone Encounter (Signed)
I called [facility] [phone]) They were not able to give me the diagnosis code to use but Vaughan Basta stated to use the diagnosis code "Hearing loss" and write out "referral for Cochlear Implant Evaluation" in the comments section of the referral.   Fax [phone] ATTN: Kasandra Knudsen is who is handling Mea's referral

## 2020-02-20 ENCOUNTER — Other Ambulatory Visit: Payer: Self-pay | Admitting: Internal Medicine

## 2020-02-20 DIAGNOSIS — H903 Sensorineural hearing loss, bilateral: Secondary | ICD-10-CM

## 2020-02-20 NOTE — Telephone Encounter (Signed)
Referral placed.

## 2020-02-21 NOTE — Telephone Encounter (Signed)
Referral faxed over to [facility] as requested below.

## 2020-02-25 NOTE — Telephone Encounter (Signed)
With all of these unrelated complaints, she needs to come in office for evaluation. 30 minutes.

## 2020-02-29 ENCOUNTER — Ambulatory Visit
Admission: EM | Admit: 2020-02-29 | Discharge: 2020-02-29 | Disposition: A | Discharge status: Home / Self Care | Payer: BC Managed Care – PPO

## 2020-02-29 ENCOUNTER — Other Ambulatory Visit: Payer: Self-pay

## 2020-02-29 DIAGNOSIS — H1032 Unspecified acute conjunctivitis, left eye: Secondary | ICD-10-CM | POA: Diagnosis not present

## 2020-02-29 MED ORDER — POLYMYXIN B-TRIMETHOPRIM 10000-0.1 UNIT/ML-% OP SOLN: 1.0000 [drp] | OPHTHALMIC | 0 refills | Status: DC

## 2020-02-29 NOTE — Discharge Instructions (Signed)
Use eyedrops as directed on eye(s) as prescribed.  May use artificial tear gel/drops at needed. °Important to use artificial tear gel/drops last and wait 10-15 minutes between drops as it can prevent your prescription drops from working properly.  °Important to follow up with Ophthalmology (eye doctor). °Return sooner for worsening of symptoms, change in vision, sensitivity to light, eye swelling, painful eye movement, or fever.  °

## 2020-02-29 NOTE — ED Triage Notes (Signed)
Pt states got shampoo in her lt eye yesterday. States woke up today with swelling, draining, itching, and pain.
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
## 2020-03-12 ENCOUNTER — Encounter: Payer: Self-pay | Admitting: Internal Medicine

## 2020-03-14 ENCOUNTER — Encounter: Payer: Self-pay | Admitting: Internal Medicine

## 2020-03-17 ENCOUNTER — Other Ambulatory Visit: Payer: Self-pay | Admitting: Internal Medicine

## 2020-03-17 DIAGNOSIS — E039 Hypothyroidism, unspecified: Secondary | ICD-10-CM

## 2020-03-20 ENCOUNTER — Encounter: Payer: Self-pay | Admitting: Internal Medicine

## 2020-03-22 NOTE — Telephone Encounter (Signed)
Schedule an appt to discuss

## 2020-03-27 ENCOUNTER — Other Ambulatory Visit: Payer: Self-pay

## 2020-03-27 ENCOUNTER — Encounter: Payer: Self-pay | Admitting: Family Medicine

## 2020-03-27 ENCOUNTER — Ambulatory Visit: Payer: BC Managed Care – PPO | Admitting: Family Medicine

## 2020-03-27 VITALS — BP 112/80 | HR 67 | Temp 97.0°F | Wt 156.5 lb

## 2020-03-27 DIAGNOSIS — F411 Generalized anxiety disorder: Secondary | ICD-10-CM | POA: Diagnosis not present

## 2020-03-27 DIAGNOSIS — F3130 Bipolar disorder, current episode depressed, mild or moderate severity, unspecified: Secondary | ICD-10-CM

## 2020-03-27 MED ORDER — ALPRAZOLAM 0.5 MG PO TABS: 0.5000 mg | ORAL_TABLET | Freq: Every day | ORAL | 0 refills | Status: DC | PRN

## 2020-03-27 NOTE — Patient Instructions (Addendum)
"  Get out of Your Head" by Victoriano Lain -- christian Pryor Curia book about anxiety  Call psychiatrist to check in an get appointment
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
## 2020-03-27 NOTE — Assessment & Plan Note (Addendum)
Had been doing well off medication, however, recently worsening in setting of separation. Advised psych f/u. She is not convinced she has bipolar and notes anxiety is her main symptom currently.

## 2020-03-27 NOTE — Assessment & Plan Note (Signed)
Notes symptoms not every day and worse in setting of current separation. Will be moving out in 1 month. Discussed short course of alprazolam (no fills since ~1 year ago) to help with short term symptoms. She is looking into starting therapy and anticipates no need for medication beyond 1-2 months. Advised psych f/u and pcp f/u if psych delayed and she runs out of medication. She knows not to take with other drugs and has hx of OD but never on benzodiazepines.

## 2020-04-04 ENCOUNTER — Encounter: Payer: Self-pay | Admitting: Internal Medicine

## 2020-04-28 ENCOUNTER — Ambulatory Visit: Payer: BC Managed Care – PPO | Admitting: Internal Medicine

## 2020-06-03 ENCOUNTER — Ambulatory Visit (INDEPENDENT_AMBULATORY_CARE_PROVIDER_SITE_OTHER): Payer: BC Managed Care – PPO | Admitting: Family Medicine

## 2020-06-03 ENCOUNTER — Other Ambulatory Visit: Payer: Self-pay

## 2020-06-03 ENCOUNTER — Encounter: Payer: Self-pay | Admitting: Family Medicine

## 2020-06-03 VITALS — BP 100/80 | HR 59 | Temp 98.0°F | Ht 62.0 in | Wt 145.8 lb

## 2020-06-03 DIAGNOSIS — M542 Cervicalgia: Secondary | ICD-10-CM

## 2020-06-03 DIAGNOSIS — E039 Hypothyroidism, unspecified: Secondary | ICD-10-CM | POA: Diagnosis not present

## 2020-06-03 DIAGNOSIS — N6310 Unspecified lump in the right breast, unspecified quadrant: Secondary | ICD-10-CM | POA: Diagnosis not present

## 2020-06-03 DIAGNOSIS — R5383 Other fatigue: Secondary | ICD-10-CM

## 2020-06-03 DIAGNOSIS — R1013 Epigastric pain: Secondary | ICD-10-CM

## 2020-06-03 LAB — CBC WITH DIFFERENTIAL/PLATELET
Basophils Absolute: 0 10*3/uL (ref 0.0–0.1)
Basophils Relative: 0.3 % (ref 0.0–3.0)
Eosinophils Absolute: 0.4 10*3/uL (ref 0.0–0.7)
Eosinophils Relative: 5 % (ref 0.0–5.0)
HCT: 47.5 % — ABNORMAL HIGH (ref 36.0–46.0)
Hemoglobin: 16.1 g/dL — ABNORMAL HIGH (ref 12.0–15.0)
Lymphocytes Relative: 40.5 % (ref 12.0–46.0)
Lymphs Abs: 3.1 10*3/uL (ref 0.7–4.0)
MCHC: 33.9 g/dL (ref 30.0–36.0)
MCV: 91.5 fl (ref 78.0–100.0)
Monocytes Absolute: 0.4 10*3/uL (ref 0.1–1.0)
Monocytes Relative: 4.9 % (ref 3.0–12.0)
Neutro Abs: 3.8 10*3/uL (ref 1.4–7.7)
Neutrophils Relative %: 49.3 % (ref 43.0–77.0)
Platelets: 196 10*3/uL (ref 150.0–400.0)
RBC: 5.19 Mil/uL — ABNORMAL HIGH (ref 3.87–5.11)
RDW: 13 % (ref 11.5–15.5)
WBC: 7.7 10*3/uL (ref 4.0–10.5)

## 2020-06-03 LAB — VITAMIN B12: Vitamin B-12: 269 pg/mL (ref 211–911)

## 2020-06-03 LAB — COMPREHENSIVE METABOLIC PANEL
ALT: 19 U/L (ref 0–35)
AST: 15 U/L (ref 0–37)
Albumin: 4.1 g/dL (ref 3.5–5.2)
Alkaline Phosphatase: 98 U/L (ref 39–117)
BUN: 17 mg/dL (ref 6–23)
CO2: 29 mEq/L (ref 19–32)
Calcium: 9.2 mg/dL (ref 8.4–10.5)
Chloride: 106 mEq/L (ref 96–112)
Creatinine, Ser: 0.65 mg/dL (ref 0.40–1.20)
GFR: 109.45 mL/min (ref >60.00)
Glucose, Bld: 93 mg/dL (ref 70–99)
Potassium: 4.3 mEq/L (ref 3.5–5.1)
Sodium: 142 mEq/L (ref 135–145)
Total Bilirubin: 0.3 mg/dL (ref 0.2–1.2)
Total Protein: 6.3 g/dL (ref 6.0–8.3)

## 2020-06-03 LAB — MONONUCLEOSIS SCREEN: Mono Screen: NEGATIVE

## 2020-06-03 LAB — LIPASE: Lipase: 116 U/L — ABNORMAL HIGH (ref 11.0–59.0)

## 2020-06-03 LAB — T3, FREE: T3, Free: 3.3 pg/mL (ref 2.3–4.2)

## 2020-06-03 LAB — TSH: TSH: 1.68 u[IU]/mL (ref 0.35–4.50)

## 2020-06-03 LAB — VITAMIN D 25 HYDROXY (VIT D DEFICIENCY, FRACTURES): VITD: 27.88 ng/mL — ABNORMAL LOW (ref 30.00–100.00)

## 2020-06-03 LAB — T4, FREE: Free T4: 0.73 ng/dL (ref 0.60–1.60)

## 2020-06-03 NOTE — Patient Instructions (Addendum)
Restart prilosec 40 mg daily just in case gastritis causing upper stomach pain.   Please stop at the lab to have labs drawn.  We will get you set up with a diagnostic mammogram.

## 2020-06-03 NOTE — Assessment & Plan Note (Signed)
Re-eval as could be contributing to fatigue.

## 2020-06-03 NOTE — Assessment & Plan Note (Signed)
Possibly related to  Past cervical radiculopathy but pt states it feels different. She seems to have more all over body pain. 4-5 trigger points for fibromyalgia.

## 2020-06-03 NOTE — Assessment & Plan Note (Signed)
No clear infectious symptoms... start eval with labs.

## 2020-06-03 NOTE — Assessment & Plan Note (Signed)
Restart PPI and try to hold NSAIDs as gastritis possible.  eval with labs for liver or pancreas issues.

## 2020-06-03 NOTE — Assessment & Plan Note (Signed)
Eval with diagnostic mammogram and [place]

## 2020-06-03 NOTE — Progress Notes (Signed)
Patient ID: Lindsey Davenport, female    DOB: Jul 02, 1980, 40 y.o.   MRN: [id number]  This visit was conducted in person.  BP 100/80   Pulse (!) 59   Temp 98 F (36.7 C) (Temporal)   Ht 5\' 2"  (1.575 m)   Wt 145 lb 12 oz (66.1 kg)   SpO2 98%   BMI 26.66 kg/m    CC:  Chief Complaint  Patient presents with  . Neck Pain    Tender area on back of neck  . Chest Pain    Tender Area on Sternum-Seen at [facility] on 05/29/2020 treated for sinusitis  . Breast Mass    Right Breast  . Fatigue    Subjective:   HPI: Lindsey Davenport is a 40 y.o. female presenting on 06/03/2020 for Neck Pain (Tender area on back of neck), Chest Pain (Tender Area on Sternum-Seen at [facility] on 05/29/2020 treated for sinusitis), Breast Mass (Right Breast), and Fatigue  1. Neck pain,  In posterior neck and lower head.. Shooting pains in legs and arms Ongoing x 3 weeks   Hx of herniated disc.. has had PT and injections in past.  2. Chest pain centrally.. worse when lying down. Also over lower sternum.. Stopped prilosec as GERD was better.  Sharp central  Occ hard to catch breath even at rest. " My whole body is tender"  3. Right breast mass.. mobile knot in left breast at 5 ocklock  History of of breast reduction. Last mammogram 2020 normal  4. Fatigue x 3 weeks, but has not had energy for a while.  Sleeping well at night, sleeping during the day  no heavy bleeding Drinking a lot of liquids.   No sick contacts.  She is taking aleve daily for her chronic neck issues. Mother with  Breast bx history.. unsure if cancer.  She is under stress, but denies depression or anxiety. Going through divorce.  BP Readings from Last 3 Encounters:  06/03/20 100/80  03/27/20 112/80  02/29/20 131/84      05/29/20... treated for sinus infeciton.. sneeze, headache,cough.. that has improved with Augmentin. Neg COVID and flu Reviewed OV note in detail.     she stopped smoking crack in  11/2019  Relevant past medical, surgical, family and social history reviewed and updated as indicated. Interim medical history since our last visit reviewed. Allergies and medications reviewed and updated. Outpatient Medications Prior to Visit  Medication Sig Dispense Refill  . amoxicillin-clavulanate (AUGMENTIN) 875-125 MG tablet Take by mouth.    . gabapentin (NEURONTIN) 100 MG capsule Take 100 mg by mouth 2 (two) times daily.    Marland Kitchen levothyroxine (SYNTHROID) 25 MCG tablet TAKE 1 TABLET BY MOUTH EVERY DAY BEFORE BREAKFAST 90 tablet 0  . ALPRAZolam (XANAX) 0.5 MG tablet Take 1 tablet (0.5 mg total) by mouth daily as needed for anxiety. 15 tablet 0  . ondansetron (ZOFRAN ODT) 4 MG disintegrating tablet Take 1 tablet (4 mg total) by mouth every 8 (eight) hours as needed for nausea or vomiting. 20 tablet 0  . omeprazole (PRILOSEC) 20 MG capsule Take 1 capsule (20 mg total) by mouth 2 (two) times daily before a meal. (Patient taking differently: Take 20 mg by mouth 2 (two) times daily as needed.) 30 capsule 0   No facility-administered medications prior to visit.     Per HPI unless specifically indicated in ROS section below Review of Systems  Constitutional: Positive for fatigue. Negative for fever.  HENT: Negative  for congestion.   Eyes: Negative for pain.  Respiratory: Positive for shortness of breath. Negative for cough.   Cardiovascular: Negative for chest pain, palpitations and leg swelling.  Gastrointestinal: Negative for abdominal pain.  Genitourinary: Negative for dysuria and vaginal bleeding.  Musculoskeletal: Positive for myalgias and neck pain. Negative for back pain.  Neurological: Negative for syncope, light-headedness and headaches.  Psychiatric/Behavioral: Negative for dysphoric mood.   Objective:  BP 100/80   Pulse (!) 59   Temp 98 F (36.7 C) (Temporal)   Ht 5\' 2"  (1.575 m)   Wt 145 lb 12 oz (66.1 kg)   SpO2 98%   BMI 26.66 kg/m   Wt Readings from Last 3  Encounters:  06/03/20 145 lb 12 oz (66.1 kg)  03/27/20 156 lb 8 oz (71 kg)  02/14/20 155 lb (70.3 kg)      Physical Exam Constitutional:      General: She is not in acute distress.    Appearance: Normal appearance. She is well-developed. She is not ill-appearing or toxic-appearing.  HENT:     Head: Normocephalic.     Right Ear: Hearing, tympanic membrane, ear canal and external ear normal.     Left Ear: Hearing, tympanic membrane, ear canal and external ear normal.     Nose: Nose normal.  Eyes:     General: Lids are normal. Lids are everted, no foreign bodies appreciated.     Conjunctiva/sclera: Conjunctivae normal.     Pupils: Pupils are equal, round, and reactive to light.  Neck:     Thyroid: No thyroid mass or thyromegaly.     Vascular: No carotid bruit.     Trachea: Trachea normal.  Cardiovascular:     Rate and Rhythm: Normal rate and regular rhythm.     Heart sounds: Normal heart sounds, S1 normal and S2 normal. No murmur heard. No gallop.   Pulmonary:     Effort: Pulmonary effort is normal. No respiratory distress.     Breath sounds: Normal breath sounds. No wheezing, rhonchi or rales.  Chest:  Breasts:     Right: Mass and tenderness present. No bleeding, inverted nipple, nipple discharge, skin change or supraclavicular adenopathy.     Left: Normal. No supraclavicular adenopathy.        Comments: Ill defined tender mass n right breast at 4 oclock  no erythema Abdominal:     General: Bowel sounds are normal. There is no distension or abdominal bruit.     Palpations: Abdomen is soft. There is no fluid wave or mass.     Tenderness: There is abdominal tenderness in the epigastric area. There is guarding. There is no right CVA tenderness, left CVA tenderness or rebound. Negative signs include Murphy's sign, McBurney's sign and obturator sign.     Hernia: No hernia is present.  Musculoskeletal:     Cervical back: Normal range of motion and neck supple.     Comments: ttp  in  Neck, upper back,  bialteral  Thighs and lower legs  Full ROM of neck.  Lymphadenopathy:     Cervical: No cervical adenopathy.     Upper Body:     Right upper body: No supraclavicular adenopathy.     Left upper body: No supraclavicular adenopathy.  Skin:    General: Skin is warm and dry.     Findings: No rash.  Neurological:     Mental Status: She is alert.     Cranial Nerves: No cranial nerve deficit.  Sensory: No sensory deficit.  Psychiatric:        Mood and Affect: Mood is not anxious or depressed.        Speech: Speech normal.        Behavior: Behavior normal. Behavior is cooperative.        Judgment: Judgment normal.       Results for orders placed or performed in visit on 02/14/20  Lipid panel  Result Value Ref Range   Cholesterol 193 0 - 200 mg/dL   Triglycerides 187.0 (H) 0.0 - 149.0 mg/dL   HDL 42.50 >39.00 mg/dL   VLDL 37.4 0.0 - 40.0 mg/dL   LDL Cholesterol 113 (H) 0 - 99 mg/dL   Total CHOL/HDL Ratio 5    NonHDL 150.32   TSH  Result Value Ref Range   TSH 0.70 0.35 - 4.50 uIU/mL    This visit occurred during the SARS-CoV-2 public health emergency.  Safety protocols were in place, including screening questions prior to the visit, additional usage of staff PPE, and extensive cleaning of exam room while observing appropriate contact time as indicated for disinfecting solutions.   COVID 19 screen:  No recent travel or known exposure to COVID19 The patient denies respiratory symptoms of COVID 19 at this time. The importance of social distancing was discussed today.   Assessment and Plan  40 year old female  With bipolar do/GAD new to me with recent cessation of crack smoking in 11/2020 with multiple new symptoms. She denies that mood is not contributing to symptoms although she is under some stress.  Problem List Items Addressed This Visit    Breast mass, right    Eval with diagnostic mammogram and [place]      Relevant Orders   MM DIAG BREAST TOMO  BILATERAL   US BREAST LTD UNI RIGHT INC AXILLA   Epigastric pain - Primary     Restart PPI and try to hold NSAIDs as gastritis possible.  eval with labs for liver or pancreas issues.      Relevant Orders   Comprehensive metabolic panel (Completed)   Lipase (Completed)   Fatigue    No clear infectious symptoms... start eval with labs.      Relevant Orders   Comprehensive metabolic panel (Completed)   Vitamin B12 (Completed)   CBC with Differential/Platelet (Completed)   TSH (Completed)   T4, free (Completed)   T3, free (Completed)   VITAMIN D 25 Hydroxy (Vit-D Deficiency, Fractures) (Completed)   Mononucleosis screen (Completed)   Hypothyroidism    Re-eval as could be contributing to fatigue.      Neck pain, acute     Possibly related to  Past cervical radiculopathy but pt states it feels different. She seems to have more all over body pain. 4-5 trigger points for fibromyalgia.              Eliezer Lofts, MD

## 2020-06-04 ENCOUNTER — Other Ambulatory Visit: Payer: Self-pay | Admitting: Family Medicine

## 2020-06-04 DIAGNOSIS — K859 Acute pancreatitis without necrosis or infection, unspecified: Secondary | ICD-10-CM

## 2020-06-04 MED ORDER — VITAMIN D (ERGOCALCIFEROL) 1.25 MG (50000 UNIT) PO CAPS: 50000.0000 [IU] | ORAL_CAPSULE | ORAL | 0 refills | Status: DC

## 2020-06-04 NOTE — Addendum Note (Signed)
Addended by: Kris Mouton on: 06/04/2020 10:47 AM   Modules accepted: Orders

## 2020-06-04 NOTE — Telephone Encounter (Signed)
Spoke with patient about her labs and she did want to go ahead and start Vit D RX. Rx sent to the pharmacy as advised by Dr Diona Browner in the lab notes. CT scan scheduled for 5/6-this Friday. Waiting for co sign on the [place] and mammogram order and then will call [place] to schedule patient. Patient is aware.

## 2020-06-06 ENCOUNTER — Ambulatory Visit (HOSPITAL_BASED_OUTPATIENT_CLINIC_OR_DEPARTMENT_OTHER)
Admission: RE | Admit: 2020-06-06 | Discharge: 2020-06-06 | Disposition: A | Discharge status: Home / Self Care | Payer: BC Managed Care – PPO | Source: Ambulatory Visit | Attending: Family Medicine | Admitting: Family Medicine

## 2020-06-06 ENCOUNTER — Other Ambulatory Visit: Payer: Self-pay

## 2020-06-06 DIAGNOSIS — K859 Acute pancreatitis without necrosis or infection, unspecified: Secondary | ICD-10-CM

## 2020-06-06 MED ORDER — IOHEXOL 300 MG/ML  SOLN
100.0000 mL | Freq: Once | INTRAMUSCULAR | Status: AC | PRN
  Administered 2020-06-06: 100 mL via INTRAVENOUS

## 2020-06-07 ENCOUNTER — Other Ambulatory Visit: Payer: Self-pay

## 2020-06-07 ENCOUNTER — Emergency Department
Admission: EM | Admit: 2020-06-07 | Discharge: 2020-06-07 | Disposition: A | Discharge status: Home / Self Care | Payer: BC Managed Care – PPO | Attending: Emergency Medicine | Admitting: Emergency Medicine

## 2020-06-07 DIAGNOSIS — R1013 Epigastric pain: Secondary | ICD-10-CM

## 2020-06-07 DIAGNOSIS — K861 Other chronic pancreatitis: Secondary | ICD-10-CM | POA: Diagnosis not present

## 2020-06-07 DIAGNOSIS — Z87891 Personal history of nicotine dependence: Secondary | ICD-10-CM | POA: Insufficient documentation

## 2020-06-07 DIAGNOSIS — Z79899 Other long term (current) drug therapy: Secondary | ICD-10-CM | POA: Diagnosis not present

## 2020-06-07 DIAGNOSIS — E039 Hypothyroidism, unspecified: Secondary | ICD-10-CM | POA: Diagnosis not present

## 2020-06-07 DIAGNOSIS — K859 Acute pancreatitis without necrosis or infection, unspecified: Secondary | ICD-10-CM

## 2020-06-07 LAB — COMPREHENSIVE METABOLIC PANEL
ALT: 17 U/L (ref 0–44)
AST: 16 U/L (ref 15–41)
Albumin: 4.1 g/dL (ref 3.5–5.0)
Alkaline Phosphatase: 82 U/L (ref 38–126)
Anion gap: 6 (ref 5–15)
BUN: 14 mg/dL (ref 6–20)
CO2: 28 mmol/L (ref 22–32)
Calcium: 9.5 mg/dL (ref 8.9–10.3)
Chloride: 106 mmol/L (ref 98–111)
Creatinine, Ser: 0.72 mg/dL (ref 0.44–1.00)
GFR, Estimated: >60 mL/min (ref >60)
Glucose, Bld: 66 mg/dL — ABNORMAL LOW (ref 70–99)
Potassium: 4.2 mmol/L (ref 3.5–5.1)
Sodium: 140 mmol/L (ref 135–145)
Total Bilirubin: 0.6 mg/dL (ref 0.3–1.2)
Total Protein: 6.6 g/dL (ref 6.5–8.1)

## 2020-06-07 LAB — URINALYSIS, COMPLETE (UACMP) WITH MICROSCOPIC
Bacteria, UA: NONE SEEN
Bilirubin Urine: NEGATIVE
Glucose, UA: NEGATIVE mg/dL
Hgb urine dipstick: NEGATIVE
Ketones, ur: NEGATIVE mg/dL
Leukocytes,Ua: NEGATIVE
Nitrite: NEGATIVE
Protein, ur: NEGATIVE mg/dL
Specific Gravity, Urine: 1.005 (ref 1.005–1.030)
pH: 6 (ref 5.0–8.0)

## 2020-06-07 LAB — CBC
HCT: 46.3 % — ABNORMAL HIGH (ref 36.0–46.0)
Hemoglobin: 15.8 g/dL — ABNORMAL HIGH (ref 12.0–15.0)
MCH: 30.8 pg (ref 26.0–34.0)
MCHC: 34.1 g/dL (ref 30.0–36.0)
MCV: 90.3 fL (ref 80.0–100.0)
Platelets: 201 10*3/uL (ref 150–400)
RBC: 5.13 MIL/uL — ABNORMAL HIGH (ref 3.87–5.11)
RDW: 12.2 % (ref 11.5–15.5)
WBC: 7.8 10*3/uL (ref 4.0–10.5)
nRBC: 0 % (ref 0.0–0.2)

## 2020-06-07 LAB — POC URINE PREG, ED: Preg Test, Ur: NEGATIVE

## 2020-06-07 LAB — LIPASE, BLOOD: Lipase: 48 U/L (ref 11–51)

## 2020-06-07 MED ORDER — MORPHINE SULFATE (PF) 4 MG/ML IV SOLN
4.0000 mg | Freq: Once | INTRAVENOUS | Status: AC
  Administered 2020-06-07: 4 mg via INTRAVENOUS
  Filled 2020-06-07: qty 1

## 2020-06-07 MED ORDER — LACTATED RINGERS IV BOLUS
1000.0000 mL | Freq: Once | INTRAVENOUS | Status: AC
  Administered 2020-06-07: 1000 mL via INTRAVENOUS

## 2020-06-07 MED ORDER — PANTOPRAZOLE SODIUM 40 MG PO TBEC: 40.0000 mg | DELAYED_RELEASE_TABLET | Freq: Every day | ORAL | 0 refills | Status: DC

## 2020-06-07 MED ORDER — PROMETHAZINE HCL 25 MG PO TABS: 25.0000 mg | ORAL_TABLET | Freq: Four times a day (QID) | ORAL | 0 refills | Status: DC | PRN

## 2020-06-07 MED ORDER — SODIUM CHLORIDE 0.9 % IV SOLN
12.5000 mg | Freq: Four times a day (QID) | INTRAVENOUS | Status: DC | PRN
  Administered 2020-06-07: 12.5 mg via INTRAVENOUS
  Filled 2020-06-07 (×2): qty 0.5

## 2020-06-07 MED ORDER — SUCRALFATE 1 G PO TABS: 1.0000 g | ORAL_TABLET | Freq: Three times a day (TID) | ORAL | 0 refills | Status: DC

## 2020-06-07 NOTE — ED Notes (Signed)
Discharge instructions reviewed with patient.  VSS.  Respirations even and unlabored.  No acute distress noted.
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
## 2020-06-07 NOTE — ED Triage Notes (Signed)
Pt states that she has been having abdominal pain and nausea for "quite awhile" and she finally got into the doctor to get blood work- pt states she had a CT done yesterday d/t elevated pancreatic levels in the blood- pt states that she was told to come here if she got worse- pt states that she feels more nauseous and cannot keep anything down

## 2020-06-09 ENCOUNTER — Other Ambulatory Visit: Payer: Self-pay

## 2020-06-09 ENCOUNTER — Ambulatory Visit
Admission: RE | Admit: 2020-06-09 | Discharge: 2020-06-09 | Disposition: A | Discharge status: Home / Self Care | Payer: BC Managed Care – PPO | Source: Ambulatory Visit | Attending: Family Medicine | Admitting: Family Medicine

## 2020-06-09 ENCOUNTER — Telehealth: Payer: Self-pay

## 2020-06-09 DIAGNOSIS — N6310 Unspecified lump in the right breast, unspecified quadrant: Secondary | ICD-10-CM | POA: Insufficient documentation

## 2020-06-09 NOTE — Telephone Encounter (Signed)
Patient was seen in ED on 5/7. Please refer to ED note.

## 2020-06-09 NOTE — Telephone Encounter (Signed)
[place] Night - Client TELEPHONE ADVICE RECORD [facility] Patient Name: Lindsey Davenport Gender: Female DOB: Aug 31, 1980 Age: 40 Y 3 M 25 D Return Phone Number: [phone] (Primary), [phone] (Secondary) Address: City/ State/ Zip: [place] [address] Client [place] Night - Client Client Site [place] Physician Eliezer Lofts - MD Contact Type Call Who Is Calling Patient / Member / Family / Caregiver Call Type Triage / Clinical Relationship To Patient Self Return Phone Number [phone] (Primary) Chief Complaint Vomiting Reason for Call Symptomatic / Request for [place] states she had a CT of her abdominal. Caller states she is throwing up. She states she can not eat or drink. She states she has not received her results yet. Translation No Nurse Assessment Nurse: Doy Mince, RN, [place]und[place] Ginger Date/Time (Eastern Time): 06/07/2020 2:26:42 PM Confirm and document reason for call. If symptomatic, describe symptoms. ---Caller states she had a CT of her abdominal yesterday. Caller states she is throwing up. She states she can not eat or drink. She states she has not received her results yet. Does the patient have any new or worsening symptoms? ---Yes Will a triage be completed? ---Yes Related visit to physician within the last 2 weeks? ---No Does the PT have any chronic conditions? (i.e. diabetes, asthma, this includes High risk factors for pregnancy, etc.) ---No Is the patient pregnant or possibly pregnant? (Ask all females between the ages of 10-55) ---No Is this a behavioral health or substance abuse call? ---No Guidelines Guideline Title Affirmed Question Affirmed Notes Nurse Date/Time (Eastern Time) Nausea Difficulty breathing Doy Mince, RN, Secundino Ginger 06/07/2020 2:29:07 PM Disp. Time Eilene Ghazi Time) Disposition Final User 06/07/2020 2:31:33 PM Go to ED  Now Yes Doy Mince, RN, Secundino Ginger PLEASE NOTE: All timestamps contained within this report are represented as [place] Standard Time. CONFIDENTIALTY NOTICE: This fax transmission is intended only for the addressee. It contains information that is legally privileged, confidential or otherwise protected from use or disclosure. If you are not the intended recipient, you are strictly prohibited from reviewing, disclosing, copying using or disseminating any of this information or taking any action in reliance on or regarding this information. If you have received this fax in error, please notify us immediately by telephone so that we can arrange for its return to [place]. Phone: [phone], Toll-Free: [phone], Fax: [phone] Page: 2 of 2 Call Id: [id number] [place] Disagree/Comply Comply Caller Understands Yes PreDisposition InappropriateToAsk Care Advice Given Per Guideline GO TO ED NOW: * You need to be seen in the Emergency Department. * Leave now. Drive carefully. CARE ADVICE given per Nausea (Adult) guideline. * It is better and safer if another adult drives instead of you. ANOTHER ADULT SHOULD DRIVE: Referrals GO TO FACILITY UNDECIDED

## 2020-06-13 ENCOUNTER — Other Ambulatory Visit: Payer: Self-pay | Admitting: Family Medicine

## 2020-06-13 DIAGNOSIS — R001 Bradycardia, unspecified: Secondary | ICD-10-CM

## 2020-06-13 DIAGNOSIS — R1013 Epigastric pain: Secondary | ICD-10-CM

## 2020-06-13 DIAGNOSIS — R9431 Abnormal electrocardiogram [ECG] [EKG]: Secondary | ICD-10-CM | POA: Insufficient documentation

## 2020-06-15 ENCOUNTER — Other Ambulatory Visit: Payer: Self-pay | Admitting: Internal Medicine

## 2020-06-15 DIAGNOSIS — E039 Hypothyroidism, unspecified: Secondary | ICD-10-CM

## 2020-06-16 MED ORDER — PANTOPRAZOLE SODIUM 40 MG PO TBEC: 40.0000 mg | DELAYED_RELEASE_TABLET | Freq: Every day | ORAL | 0 refills | Status: DC

## 2020-06-16 NOTE — Addendum Note (Signed)
Addended by: Eliezer Lofts E on: 06/16/2020 05:22 PM   Modules accepted: Orders

## 2020-06-24 ENCOUNTER — Encounter: Payer: Self-pay | Admitting: Cardiology

## 2020-06-24 ENCOUNTER — Other Ambulatory Visit: Payer: Self-pay

## 2020-06-24 ENCOUNTER — Ambulatory Visit (INDEPENDENT_AMBULATORY_CARE_PROVIDER_SITE_OTHER): Payer: BC Managed Care – PPO | Admitting: Cardiology

## 2020-06-24 VITALS — BP 110/60 | HR 58 | Ht 63.0 in | Wt 148.0 lb

## 2020-06-24 DIAGNOSIS — R9431 Abnormal electrocardiogram [ECG] [EKG]: Secondary | ICD-10-CM

## 2020-06-24 DIAGNOSIS — F17200 Nicotine dependence, unspecified, uncomplicated: Secondary | ICD-10-CM | POA: Diagnosis not present

## 2020-06-24 DIAGNOSIS — R072 Precordial pain: Secondary | ICD-10-CM | POA: Diagnosis not present

## 2020-06-24 NOTE — Patient Instructions (Signed)
Medication Instructions:   Your physician recommends that you continue on your current medications as directed. Please refer to the Current Medication list given to you today.   *If you need a refill on your cardiac medications before your next appointment, please call your pharmacy*   Lab Work:  None ordered  If you have labs (blood work) drawn today and your tests are completely normal, you will receive your results only by: Marland Kitchen MyChart Message (if you have MyChart) OR . A paper copy in the mail If you have any lab test that is abnormal or we need to change your treatment, we will call you to review the results.   Testing/Procedures:  1.  Your physician has requested that you have an echocardiogram. Echocardiography is a painless test that uses sound waves to create images of your heart. It provides your doctor with information about the size and shape of your heart and how well your heart's chambers and valves are working. This procedure takes approximately one hour. There are no restrictions for this procedure.   2.  [place]     Your caregiver has ordered a Stress Test with nuclear imaging. The purpose of this test is to evaluate the blood supply to your heart muscle. This procedure is referred to as a "Non-Invasive Stress Test." This is because other than having an IV started in your vein, nothing is inserted or "invades" your body. Cardiac stress tests are done to find areas of poor blood flow to the heart by determining the extent of coronary artery disease (CAD). Some patients exercise on a treadmill, which naturally increases the blood flow to your heart, while others who are  unable to walk on a treadmill due to physical limitations have a pharmacologic/chemical stress agent called Lexiscan . This medicine will mimic walking on a treadmill by temporarily increasing your coronary blood flow.      PLEASE REPORT TO [facility] MEDICAL MALL ENTRANCE   THE VOLUNTEERS AT THE FIRST DESK  WILL DIRECT YOU WHERE TO GO     *Please note: these test may take anywhere between 2-4 hours to complete       Date of Procedure:_____________________________________   Arrival Time for Procedure:______________________________    PLEASE NOTIFY THE OFFICE AT LEAST 24 HOURS IN ADVANCE IF YOU ARE UNABLE TO KEEP YOUR APPOINTMENT.  [place] 24 HOURS IN ADVANCE IF YOU ARE UNABLE TO KEEP YOUR APPOINTMENT. [phone]    How to prepare for your Myoview test:    1. Do not eat or drink after midnight  2. No caffeine for 24 hours prior to test  3. No smoking 24 hours prior to test.  4. Unless instructed otherwise, Take your medication with a small sips of water.    5.         Ladies, please do not wear dresses. Skirts or pants are appropriate. Please wear a short sleeve shirt.  6. No perfume, cologne or lotion.  7. Wear comfortable walking shoes. No heels!    Follow-Up: At [facility], you and your health needs are our priority.  As part of our continuing mission to provide you with exceptional heart care, we have created designated Provider Care Teams.  These Care Teams include your primary Cardiologist (physician) and Advanced Practice Providers (APPs -  Physician Assistants and Nurse Practitioners) who all work together to provide you with the care you need, when you need it.  We recommend signing  up for the patient portal called "MyChart".  Sign up information is provided on this After Visit Summary.  MyChart is used to connect with patients for Virtual Visits (Telemedicine).  Patients are able to view lab/test results, encounter notes, upcoming appointments, etc.  Non-urgent messages can be sent to your provider as well.   To learn more about what you can do with MyChart, go to [email or web address].    Your next appointment:   Follow up after testing   The format for your next appointment:   In Person  Provider:   Kate Sable, MD   Other Instructions
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
## 2020-07-08 ENCOUNTER — Inpatient Hospital Stay: Admission: RE | Admit: 2020-07-08 | Payer: Medicare Other | Source: Ambulatory Visit

## 2020-07-15 ENCOUNTER — Other Ambulatory Visit: Payer: BC Managed Care – PPO

## 2020-07-19 ENCOUNTER — Other Ambulatory Visit: Payer: Self-pay | Admitting: Family Medicine

## 2020-07-20 NOTE — Telephone Encounter (Signed)
Last office visit 06/03/20 with Dr. Diona Browner for Epigastric Pain.  Last refilled 06/16/2020 for #30 with no refills.  No TOC appointment scheduled.

## 2020-07-21 ENCOUNTER — Ambulatory Visit: Payer: BC Managed Care – PPO | Admitting: Physician Assistant

## 2020-07-24 ENCOUNTER — Ambulatory Visit: Payer: BC Managed Care – PPO | Admitting: Gastroenterology

## 2020-09-23 ENCOUNTER — Ambulatory Visit (INDEPENDENT_AMBULATORY_CARE_PROVIDER_SITE_OTHER): Payer: BC Managed Care – PPO | Admitting: Family Medicine

## 2020-09-23 ENCOUNTER — Other Ambulatory Visit: Payer: Self-pay

## 2020-09-23 VITALS — BP 108/70 | HR 81 | Temp 98.2°F | Ht 62.0 in | Wt 152.5 lb

## 2020-09-23 DIAGNOSIS — M797 Fibromyalgia: Secondary | ICD-10-CM

## 2020-09-23 DIAGNOSIS — N9089 Other specified noninflammatory disorders of vulva and perineum: Secondary | ICD-10-CM | POA: Insufficient documentation

## 2020-09-23 DIAGNOSIS — F1911 Other psychoactive substance abuse, in remission: Secondary | ICD-10-CM | POA: Diagnosis not present

## 2020-09-23 MED ORDER — CYCLOBENZAPRINE HCL 10 MG PO TABS: 10.0000 mg | ORAL_TABLET | Freq: Three times a day (TID) | ORAL | 0 refills | Status: DC

## 2020-09-23 NOTE — Progress Notes (Signed)
Subjective:     Lindsey Davenport is a 40 y.o. female presenting for Hospitalization Follow-up (Rehab facility 07/12/20 to 09/15/20 for cocaine use ) and Medication Refill     HPI  #fibromyalgia - would like her flexeril refilled - takes this up to 3 times a day - does not want to discuss recent relapse and rehab as planning to follow-up PCP to her clinic.    #Vaginal knot - no pain - firm - does shave - has been doing warm compresses - no redness - present for 3-4 weeks    Review of Systems   Social History   Tobacco Use  Smoking Status Former   Packs/day: 0.25   Years: 18.00   Pack years: 4.50   Types: Cigarettes   Quit date: 08/16/2018   Years since quitting: 2.1  Smokeless Tobacco Never        Objective:    BP Readings from Last 3 Encounters:  09/23/20 108/70  06/24/20 110/60  06/07/20 110/75   Wt Readings from Last 3 Encounters:  09/23/20 152 lb 8 oz (69.2 kg)  06/24/20 148 lb (67.1 kg)  06/07/20 145 lb (65.8 kg)    BP 108/70   Pulse 81   Temp 98.2 F (36.8 C) (Temporal)   Ht '5\' 2"'$  (1.575 m)   Wt 152 lb 8 oz (69.2 kg)   SpO2 98%   BMI 27.89 kg/m    Physical Exam Exam conducted with a chaperone present.  Constitutional:      General: She is not in acute distress.    Appearance: She is well-developed. She is not diaphoretic.  HENT:     Right Ear: External ear normal.     Left Ear: External ear normal.  Eyes:     Conjunctiva/sclera: Conjunctivae normal.  Cardiovascular:     Rate and Rhythm: Normal rate.  Pulmonary:     Effort: Pulmonary effort is normal.  Genitourinary:    Labia:        Left: Lesion (small, firm, yellow nodule in the left labia inner fold) present.   Musculoskeletal:     Cervical back: Neck supple.  Skin:    General: Skin is warm and dry.     Capillary Refill: Capillary refill takes less than 2 seconds.  Neurological:     Mental Status: She is alert. Mental status is at baseline.  Psychiatric:         Mood and Affect: Mood normal.        Behavior: Behavior normal.          Assessment & Plan:   Problem List Items Addressed This Visit       Other   History of drug abuse ([place])    Replaced cocaine June 2022 and went to rehab. Rehab med list available and will update chart. Pt planning to transfer to her PCP at new office so further discussion deferred.       Labial lesion - Primary    Benign appearing lesion - firm and yellow, ?cholesterol deposit - though no other similar lesions. Advised routing GYN f/u and discuss with them. Reviewed warning signs - increase in size or redness. But given stability ok to wait for routine follow-up. Has a GYN.       Fibromyalgia    Some days worse than others. Notes occasionally needs up to 3 flexeril per day. Refill for 1 month provided      Relevant Medications   DULoxetine (CYMBALTA) 60 MG capsule  DULoxetine (CYMBALTA) 20 MG capsule   cyclobenzaprine (FLEXERIL) 10 MG tablet   acetaminophen (TYLENOL) 500 MG tablet   ibuprofen (ADVIL) 800 MG tablet     Return if symptoms worsen or fail to improve.  Lesleigh Noe, MD  This visit occurred during the SARS-CoV-2 public health emergency.  Safety protocols were in place, including screening questions prior to the visit, additional usage of staff PPE, and extensive cleaning of exam room while observing appropriate contact time as indicated for disinfecting solutions.

## 2020-09-23 NOTE — Assessment & Plan Note (Signed)
Some days worse than others. Notes occasionally needs up to 3 flexeril per day. Refill for 1 month provided

## 2020-09-23 NOTE — Assessment & Plan Note (Addendum)
Benign appearing lesion - firm and yellow, ?cholesterol deposit - though no other similar lesions. Advised routing GYN f/u and discuss with them. Reviewed warning signs - increase in size or redness. But given stability ok to wait for routine follow-up. Has a GYN.

## 2020-09-23 NOTE — Patient Instructions (Signed)
Vaginal lesion - not concerning but not sure what this is - would recommend discussing with OB at your next appointment - if redness, increasing in size come back sooner

## 2020-09-23 NOTE — Assessment & Plan Note (Signed)
Replaced cocaine June 2022 and went to rehab. Rehab med list available and will update chart. Pt planning to transfer to her PCP at new office so further discussion deferred.

## 2020-10-02 ENCOUNTER — Other Ambulatory Visit: Payer: Self-pay

## 2020-10-02 ENCOUNTER — Encounter: Payer: Self-pay | Admitting: Internal Medicine

## 2020-10-02 ENCOUNTER — Ambulatory Visit: Payer: BC Managed Care – PPO | Admitting: Internal Medicine

## 2020-10-02 VITALS — BP 106/77 | HR 72 | Temp 97.1°F | Resp 17 | Ht 62.0 in | Wt 154.2 lb

## 2020-10-02 DIAGNOSIS — E039 Hypothyroidism, unspecified: Secondary | ICD-10-CM | POA: Diagnosis not present

## 2020-10-02 DIAGNOSIS — K219 Gastro-esophageal reflux disease without esophagitis: Secondary | ICD-10-CM

## 2020-10-02 DIAGNOSIS — G2581 Restless legs syndrome: Secondary | ICD-10-CM | POA: Insufficient documentation

## 2020-10-02 DIAGNOSIS — E782 Mixed hyperlipidemia: Secondary | ICD-10-CM

## 2020-10-02 DIAGNOSIS — F99 Mental disorder, not otherwise specified: Secondary | ICD-10-CM

## 2020-10-02 DIAGNOSIS — F411 Generalized anxiety disorder: Secondary | ICD-10-CM

## 2020-10-02 DIAGNOSIS — R7303 Prediabetes: Secondary | ICD-10-CM | POA: Diagnosis not present

## 2020-10-02 DIAGNOSIS — F1911 Other psychoactive substance abuse, in remission: Secondary | ICD-10-CM

## 2020-10-02 DIAGNOSIS — G4700 Insomnia, unspecified: Secondary | ICD-10-CM | POA: Insufficient documentation

## 2020-10-02 DIAGNOSIS — F3130 Bipolar disorder, current episode depressed, mild or moderate severity, unspecified: Secondary | ICD-10-CM

## 2020-10-02 DIAGNOSIS — Z6828 Body mass index (BMI) 28.0-28.9, adult: Secondary | ICD-10-CM | POA: Insufficient documentation

## 2020-10-02 DIAGNOSIS — E663 Overweight: Secondary | ICD-10-CM | POA: Insufficient documentation

## 2020-10-02 DIAGNOSIS — M797 Fibromyalgia: Secondary | ICD-10-CM

## 2020-10-02 DIAGNOSIS — F5105 Insomnia due to other mental disorder: Secondary | ICD-10-CM

## 2020-10-02 NOTE — Assessment & Plan Note (Signed)
Encouraged diet and exercise for weight loss ?

## 2020-10-02 NOTE — Assessment & Plan Note (Signed)
Continue Flexeril as needed Encouraged daily stretching

## 2020-10-02 NOTE — Assessment & Plan Note (Signed)
Continue Melatonin at bedtime Will monitor

## 2020-10-02 NOTE — Assessment & Plan Note (Signed)
Encouraged weight loss as this can help reduce reflux symptoms Continue Pantoprazole CBC and CMET today

## 2020-10-02 NOTE — Assessment & Plan Note (Signed)
Encouraged her to consume a low carb diet and exercise for weight loss A1C today 

## 2020-10-02 NOTE — Assessment & Plan Note (Signed)
CMET and lipid profile today Encouraged her to consume a low fat diet 

## 2020-10-02 NOTE — Assessment & Plan Note (Signed)
TSH and Free T4 today Will adjust Levothyroxine if needed based on labs 

## 2020-10-02 NOTE — Assessment & Plan Note (Signed)
Stable on her current dose of Duloxetine and Buspirone She has an appt with psychiatry and a therapist Support offered
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
## 2020-10-02 NOTE — Assessment & Plan Note (Signed)
Continue Requip. 

## 2020-10-02 NOTE — Assessment & Plan Note (Signed)
In remission currently s/p recent stay in rehab facility She has a support person/life coach/sponser

## 2020-10-02 NOTE — Patient Instructions (Signed)
Heart-Healthy Eating Plan Heart-healthy meal planning includes: Eating less unhealthy fats. Eating more healthy fats. Making other changes in your diet. Talk with your doctor or a diet specialist (dietitian) to create an eating plan that is right for you. What is my plan? Your doctor may recommend an eating plan that includes: Total fat: ______% or less of total calories a day. Saturated fat: ______% or less of total calories a day. Cholesterol: less than _________mg a day. What are tips for following this plan? Cooking Avoid frying your food. Try to bake, boil, grill, or broil it instead. You can also reduce fat by: Removing the skin from poultry. Removing all visible fats from meats. Steaming vegetables in water or broth. Meal planning  At meals, divide your plate into four equal parts: Fill one-half of your plate with vegetables and green salads. Fill one-fourth of your plate with whole grains. Fill one-fourth of your plate with lean protein foods. Eat 4-5 servings of vegetables per day. A serving of vegetables is: 1 cup of raw or cooked vegetables. 2 cups of raw leafy greens. Eat 4-5 servings of fruit per day. A serving of fruit is: 1 medium whole fruit.  cup of dried fruit.  cup of fresh, frozen, or canned fruit.  cup of 100% fruit juice. Eat more foods that have soluble fiber. These are apples, broccoli, carrots, beans, peas, and barley. Try to get 20-30 g of fiber per day. Eat 4-5 servings of nuts, legumes, and seeds per week: 1 serving of dried beans or legumes equals  cup after being cooked. 1 serving of nuts is  cup. 1 serving of seeds equals 1 tablespoon. General information Eat more home-cooked food. Eat less restaurant, buffet, and fast food. Limit or avoid alcohol. Limit foods that are high in starch and sugar. Avoid fried foods. Lose weight if you are overweight. Keep track of how much salt (sodium) you eat. This is important if you have high blood  pressure. Ask your doctor to tell you more about this. Try to add vegetarian meals each week. Fats Choose healthy fats. These include olive oil and canola oil, flaxseeds, walnuts, almonds, and seeds. Eat more omega-3 fats. These include salmon, mackerel, sardines, tuna, flaxseed oil, and ground flaxseeds. Try to eat fish at least 2 times each week. Check food labels. Avoid foods with trans fats or high amounts of saturated fat. Limit saturated fats. These are often found in animal products, such as meats, butter, and cream. These are also found in plant foods, such as palm oil, palm kernel oil, and coconut oil. Avoid foods with partially hydrogenated oils in them. These have trans fats. Examples are stick margarine, some tub margarines, cookies, crackers, and other baked goods. What foods can I eat? Fruits All fresh, canned (in natural juice), or frozen fruits. Vegetables Fresh or frozen vegetables (raw, steamed, roasted, or grilled). Green salads. Grains Most grains. Choose whole wheat and whole grains most of the time. Rice and pasta, including brown rice and pastas made with whole wheat. Meats and other proteins Lean, well-trimmed beef, veal, pork, and lamb. Chicken and turkey without skin. All fish and shellfish. Wild duck, rabbit, pheasant, and venison. Egg whites or low-cholesterol egg substitutes. Dried beans, peas, lentils, and tofu. Seeds and most nuts. Dairy Low-fat or nonfat cheeses, including ricotta and mozzarella. Skim or 1% milk that is liquid, powdered, or evaporated. Buttermilk that is made with low-fat milk. Nonfat or low-fat yogurt. Fats and oils Non-hydrogenated (trans-free) margarines. Vegetable oils, including   soybean, sesame, sunflower, olive, peanut, safflower, corn, canola, and cottonseed. Salad dressings or mayonnaise made with a vegetable oil. Beverages Mineral water. Coffee and tea. Diet carbonated beverages. Sweets and desserts Sherbet, gelatin, and fruit ice.  Small amounts of dark chocolate. Limit all sweets and desserts. Seasonings and condiments All seasonings and condiments. The items listed above may not be a complete list of foods and drinks you can eat. Contact a dietitian for more options. What foods should I avoid? Fruits Canned fruit in heavy syrup. Fruit in cream or butter sauce. Fried fruit. Limit coconut. Vegetables Vegetables cooked in cheese, cream, or butter sauce. Fried vegetables. Grains Breads that are made with saturated or trans fats, oils, or whole milk. Croissants. Sweet rolls. Donuts. High-fat crackers, such as cheese crackers. Meats and other proteins Fatty meats, such as hot dogs, ribs, sausage, bacon, rib-eye roast or steak. High-fat deli meats, such as salami and bologna. Caviar. Domestic duck and goose. Organ meats, such as liver. Dairy Cream, sour cream, cream cheese, and creamed cottage cheese. Whole-milk cheeses. Whole or 2% milk that is liquid, evaporated, or condensed. Whole buttermilk. Cream sauce or high-fat cheese sauce. Yogurt that is made from whole milk. Fats and oils Meat fat, or shortening. Cocoa butter, hydrogenated oils, palm oil, coconut oil, palm kernel oil. Solid fats and shortenings, including bacon fat, salt pork, lard, and butter. Nondairy cream substitutes. Salad dressings with cheese or sour cream. Beverages Regular sodas and juice drinks with added sugar. Sweets and desserts Frosting. Pudding. Cookies. Cakes. Pies. Milk chocolate or white chocolate. Buttered syrups. Full-fat ice cream or ice cream drinks. The items listed above may not be a complete list of foods and drinks to avoid. Contact a dietitian for more information. Summary Heart-healthy meal planning includes eating less unhealthy fats, eating more healthy fats, and making other changes in your diet. Eat a balanced diet. This includes fruits and vegetables, low-fat or nonfat dairy, lean protein, nuts and legumes, whole grains, and  heart-healthy oils and fats. This information is not intended to replace advice given to you by your health care provider. Make sure you discuss any questions you have with your health care provider. Document Revised: 05/29/2020 Document Reviewed: 05/29/2020 Elsevier Patient Education  2022 Elsevier Inc.  

## 2020-10-03 LAB — LIPID PANEL
Cholesterol: 207 mg/dL — ABNORMAL HIGH (ref <200)
HDL: 54 mg/dL (ref >=50)
LDL Cholesterol (Calc): 129 mg/dL (calc) — ABNORMAL HIGH
Non-HDL Cholesterol (Calc): 153 mg/dL (calc) — ABNORMAL HIGH (ref <130)
Total CHOL/HDL Ratio: 3.8 (calc) (ref <5.0)
Triglycerides: 129 mg/dL (ref <150)

## 2020-10-03 LAB — COMPLETE METABOLIC PANEL WITH GFR
AG Ratio: 2 (calc) (ref 1.0–2.5)
ALT: 21 U/L (ref 6–29)
AST: 16 U/L (ref 10–30)
Albumin: 4.5 g/dL (ref 3.6–5.1)
Alkaline phosphatase (APISO): 123 U/L (ref 31–125)
BUN/Creatinine Ratio: 45 (calc) — ABNORMAL HIGH (ref 6–22)
BUN: 30 mg/dL — ABNORMAL HIGH (ref 7–25)
CO2: 27 mmol/L (ref 20–32)
Calcium: 9.9 mg/dL (ref 8.6–10.2)
Chloride: 102 mmol/L (ref 98–110)
Creat: 0.66 mg/dL (ref 0.50–0.99)
Globulin: 2.3 g/dL (calc) (ref 1.9–3.7)
Glucose, Bld: 98 mg/dL (ref 65–99)
Potassium: 4.2 mmol/L (ref 3.5–5.3)
Sodium: 139 mmol/L (ref 135–146)
Total Bilirubin: 0.6 mg/dL (ref 0.2–1.2)
Total Protein: 6.8 g/dL (ref 6.1–8.1)
eGFR: 113 mL/min/{1.73_m2} (ref >=60)

## 2020-10-03 LAB — CBC
HCT: 45 % (ref 35.0–45.0)
Hemoglobin: 14.8 g/dL (ref 11.7–15.5)
MCH: 30.5 pg (ref 27.0–33.0)
MCHC: 32.9 g/dL (ref 32.0–36.0)
MCV: 92.8 fL (ref 80.0–100.0)
MPV: 10.7 fL (ref 7.5–12.5)
Platelets: 254 10*3/uL (ref 140–400)
RBC: 4.85 10*6/uL (ref 3.80–5.10)
RDW: 12.7 % (ref 11.0–15.0)
WBC: 5.7 10*3/uL (ref 3.8–10.8)

## 2020-10-03 LAB — TSH: TSH: 3.75 mIU/L

## 2020-10-03 LAB — HEMOGLOBIN A1C
Hgb A1c MFr Bld: 5.2 % of total Hgb (ref <5.7)
Mean Plasma Glucose: 103 mg/dL
eAG (mmol/L): 5.7 mmol/L

## 2020-10-03 LAB — T4, FREE: Free T4: 1.1 ng/dL (ref 0.8–1.8)

## 2020-10-06 ENCOUNTER — Encounter: Payer: Self-pay | Admitting: Internal Medicine

## 2020-10-06 DIAGNOSIS — E039 Hypothyroidism, unspecified: Secondary | ICD-10-CM

## 2020-10-08 ENCOUNTER — Other Ambulatory Visit: Payer: Self-pay | Admitting: Family

## 2020-10-08 DIAGNOSIS — E039 Hypothyroidism, unspecified: Secondary | ICD-10-CM

## 2020-10-21 MED ORDER — DULOXETINE HCL 60 MG PO CPEP: 60.0000 mg | ORAL_CAPSULE | Freq: Two times a day (BID) | ORAL | 0 refills | Status: DC

## 2020-10-21 MED ORDER — LEVOTHYROXINE SODIUM 25 MCG PO TABS: ORAL_TABLET | ORAL | 0 refills | Status: DC

## 2020-10-21 MED ORDER — BUSPIRONE HCL 10 MG PO TABS: 10.0000 mg | ORAL_TABLET | Freq: Three times a day (TID) | ORAL | 0 refills | Status: DC

## 2020-10-21 MED ORDER — CYCLOBENZAPRINE HCL 10 MG PO TABS: 10.0000 mg | ORAL_TABLET | Freq: Three times a day (TID) | ORAL | 0 refills | Status: DC

## 2020-10-21 MED ORDER — ROPINIROLE HCL 1 MG PO TABS: 1.0000 mg | ORAL_TABLET | Freq: Every day | ORAL | 0 refills | Status: DC

## 2020-10-28 IMAGING — MR MR CERVICAL SPINE W/O CM
5 series · 29 of 48 positions shown · non-contrast
Comparison: Cervical spine radiographs 08/25/2017 and CT 01/29/2015

CLINICAL DATA: Neck pain. Numbness in both hands. Weakness.
Symptoms for 6 months.

EXAM:
MRI CERVICAL SPINE WITHOUT CONTRAST
TECHNIQUE: Multiplanar, multisequence MR imaging of the cervical spine was
performed. No intravenous contrast was administered.

[Series 2: T2 · sagittal · 3.0mm · 0.41mm/px · 6 of 13 slices shown (1 of 2)]
[im 1/13]
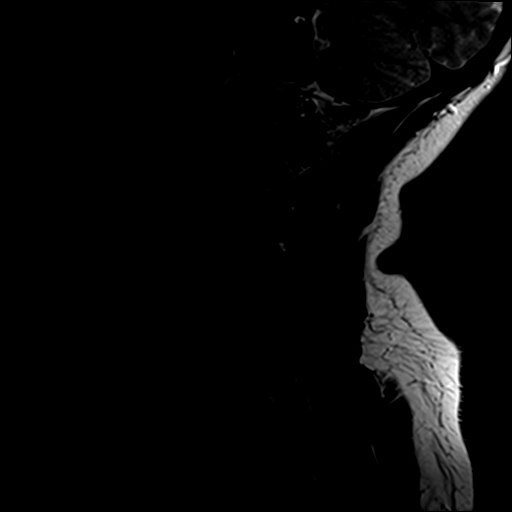
[im 3/13]
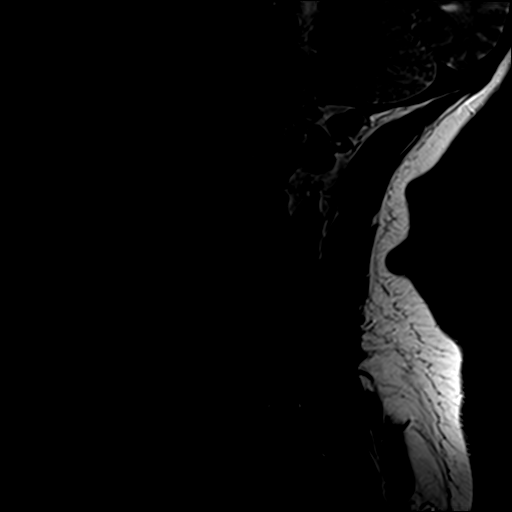
[im 5/13]
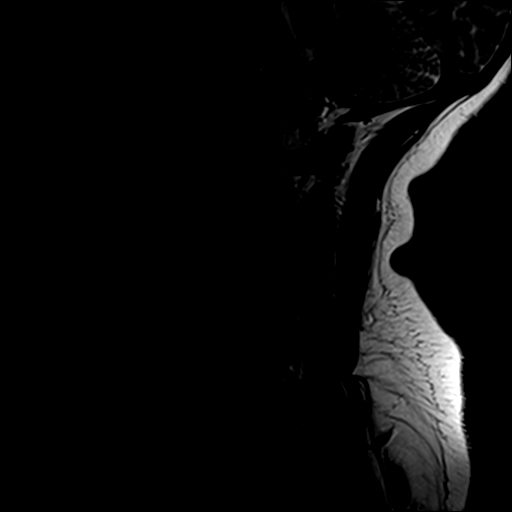
[im 8/13]
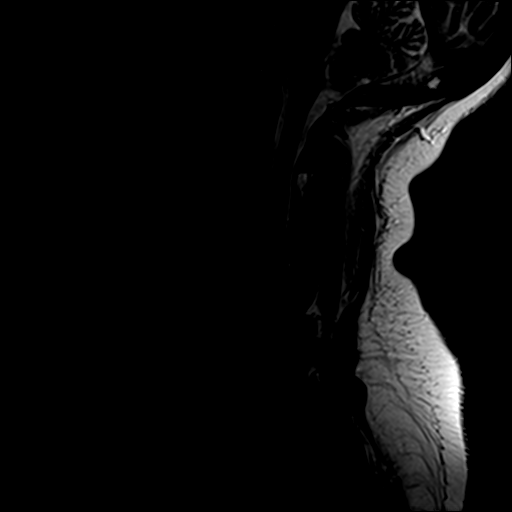
[im 10/13]
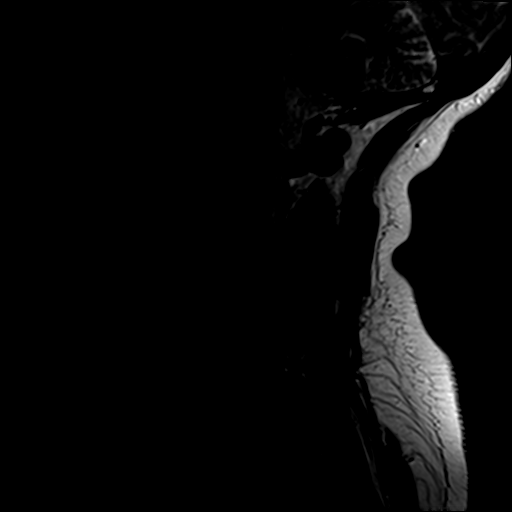
[im 13/13]
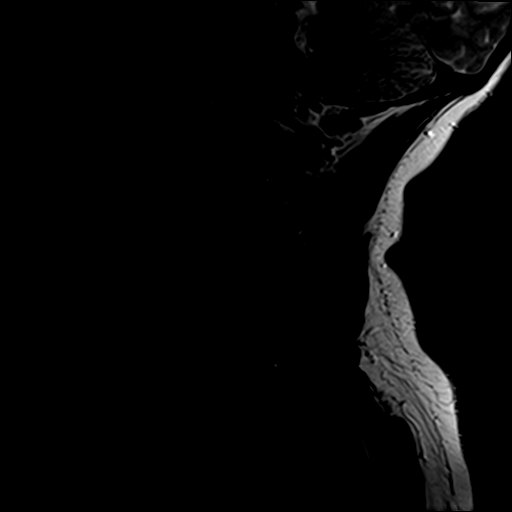

[Series 3: T1 · sagittal · 3.0mm · 0.41mm/px · 6 of 13 slices shown]
[im 1/13]
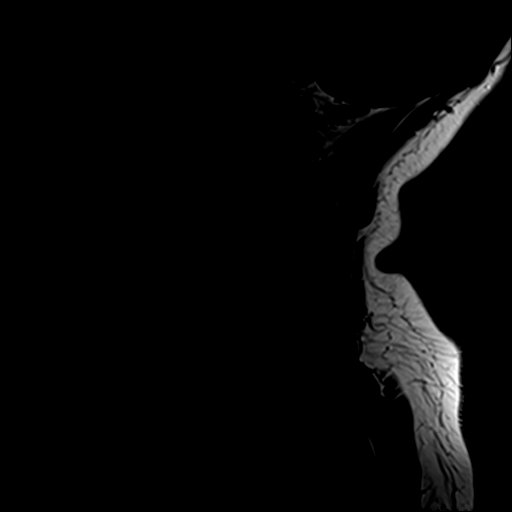
[im 3/13]
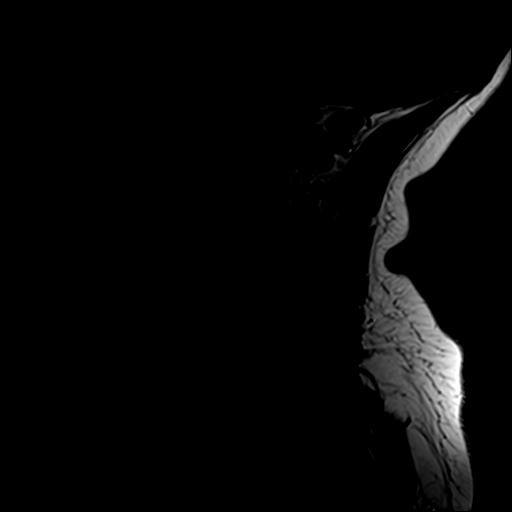
[im 5/13]
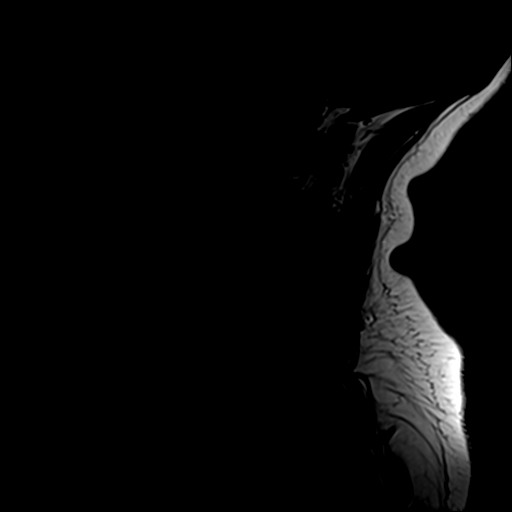
[im 8/13]
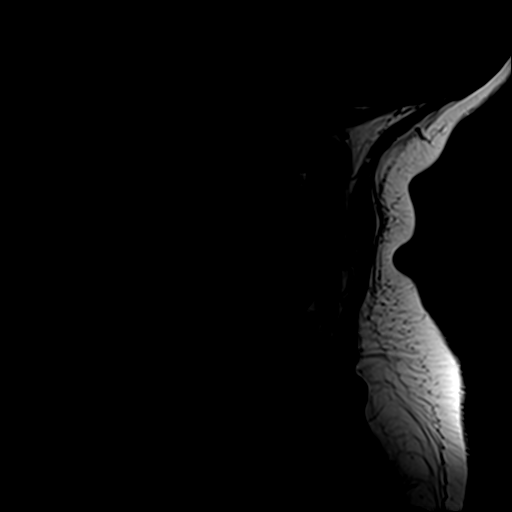
[im 10/13]
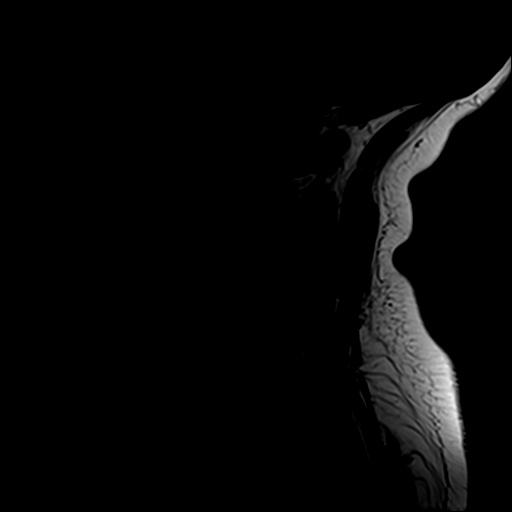
[im 13/13]
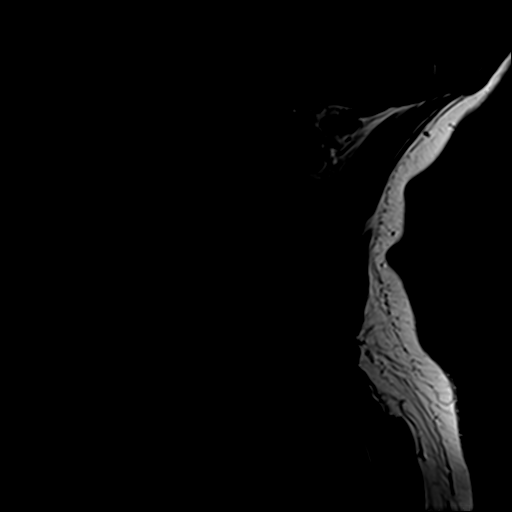

[Series 4: STIR · sagittal · 3.0mm · 0.82mm/px · 6 of 13 slices shown]
[im 1/13]
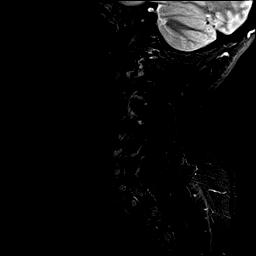
[im 3/13]
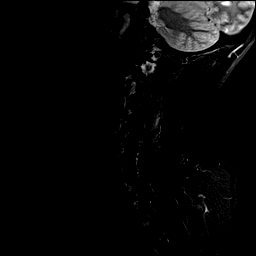
[im 5/13]
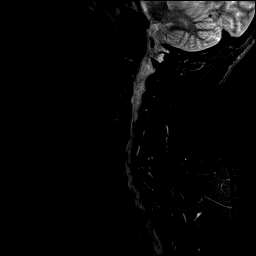
[im 8/13]
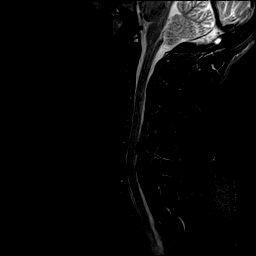
[im 10/13]
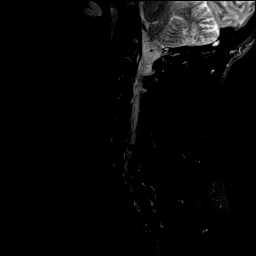
[im 13/13]
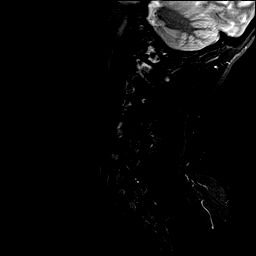

[Series 5: T2 · axial · 3.0mm · 0.70mm/px · z∈[-75,+36]mm · 9 of 30 slices shown (2 of 2)]
[im 1/30]
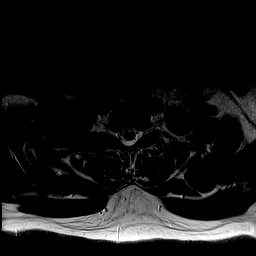
[im 5/30]
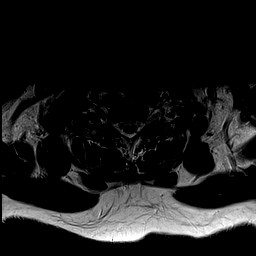
[im 9/30]
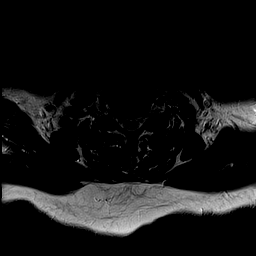
[im 13/30]
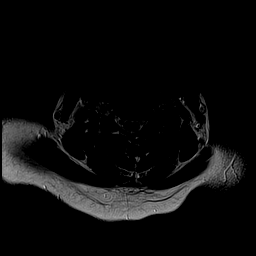
[im 15/30]
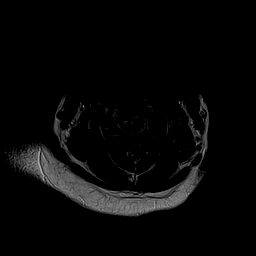
[im 17/30]
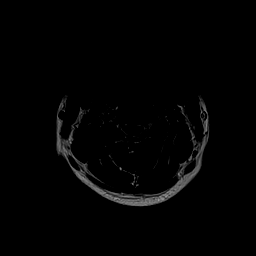
[im 21/30]
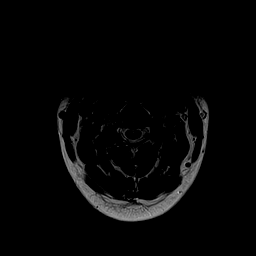
[im 25/30]
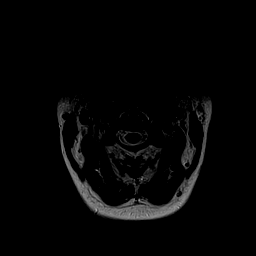
[im 30/30]
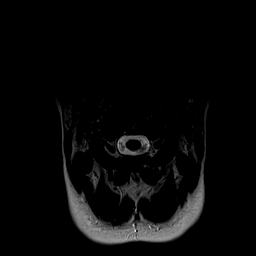

[Series 6: GRE · axial · 3.0mm · 0.35mm/px · z∈[-75,-60]mm · 2 of 30 slices shown]
[im 1/30]
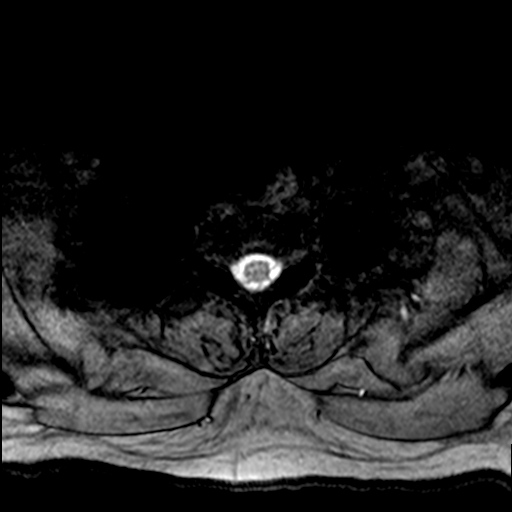
[im 5/30]
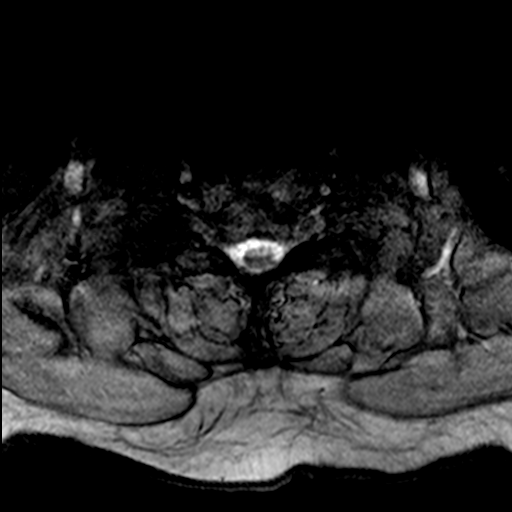

[29 of 48 positions shown; findings below may reference images not displayed]

FINDINGS: Alignment: Straightening of the normal lordosis in the upper
cervical spine. No listhesis.

Vertebrae: Mildly diminished bone marrow T1 signal intensity
diffusely, nonspecific though can be seen with anemia, smoking, and
obesity. No suspicious focal osseous lesion, fracture, or
significant marrow edema. Preserved disc space heights.

Cord: Normal signal and morphology.

Posterior Fossa, vertebral arteries, paraspinal tissues:
Unremarkable.

Disc levels:

C2-3: Negative.

C3-4: Mild right facet arthrosis and at most minimal disc bulging
without stenosis.

C4-5: Mild disc bulging, mild uncovertebral spurring, and mild facet
arthrosis result in mild bilateral neural foraminal stenosis without
spinal stenosis.

C5-6: Severe left facet arthrosis result in moderate left neural
foraminal stenosis potentially affecting the left C6 nerve root. No
spinal stenosis.

C6-7: Mild disc bulging and mild facet arthrosis result in mild
right neural foraminal stenosis without spinal stenosis.

C7-T1: Mild right and moderate left facet arthrosis without disc
herniation or stenosis.

Facet arthrosis has progressed at multiple level since 0043.
IMPRESSION: 1. Progressive cervical facet arthrosis, severe on the left at C5-6
with moderate left neural foraminal stenosis.
2. Mild neural foraminal stenosis bilaterally at C4-5 and on the
right at C6-7.
3. No spinal stenosis.

## 2020-11-06 ENCOUNTER — Ambulatory Visit: Payer: BC Managed Care – PPO | Admitting: Internal Medicine

## 2020-11-06 ENCOUNTER — Encounter: Payer: Self-pay | Admitting: Internal Medicine

## 2020-11-06 ENCOUNTER — Other Ambulatory Visit: Payer: Self-pay

## 2020-11-06 ENCOUNTER — Ambulatory Visit
Admission: RE | Admit: 2020-11-06 | Discharge: 2020-11-06 | Disposition: A | Discharge status: Home / Self Care | Payer: BC Managed Care – PPO | Attending: Internal Medicine | Admitting: Internal Medicine

## 2020-11-06 ENCOUNTER — Ambulatory Visit
Admission: RE | Admit: 2020-11-06 | Discharge: 2020-11-06 | Disposition: A | Discharge status: Home / Self Care | Payer: BC Managed Care – PPO | Source: Ambulatory Visit | Attending: Internal Medicine | Admitting: Internal Medicine

## 2020-11-06 VITALS — BP 120/73 | HR 77 | Temp 97.7°F | Resp 17 | Ht 62.0 in | Wt 158.4 lb

## 2020-11-06 DIAGNOSIS — M5412 Radiculopathy, cervical region: Secondary | ICD-10-CM | POA: Diagnosis not present

## 2020-11-06 DIAGNOSIS — Z6828 Body mass index (BMI) 28.0-28.9, adult: Secondary | ICD-10-CM

## 2020-11-06 DIAGNOSIS — E663 Overweight: Secondary | ICD-10-CM

## 2020-11-06 MED ORDER — PREDNISONE 10 MG PO TABS: ORAL_TABLET | ORAL | 0 refills | Status: DC

## 2020-11-06 NOTE — Assessment & Plan Note (Signed)
Encourage diet and exercise for weight loss 

## 2020-11-06 NOTE — Patient Instructions (Signed)
Neck Exercises Ask your health care provider which exercises are safe for you. Do exercises exactly as told by your health care provider and adjust them as directed. It is normal to feel mild stretching, pulling, tightness, or discomfort as you do these exercises. Stop right away if you feel sudden pain or your pain gets worse. Do not begin these exercises until told by your health care provider. Neck exercises can be important for many reasons. They can improve strength and maintain flexibility in your neck, which will help your upper back and preventneck pain. Stretching exercises Rotation neck stretching  Sit in a chair or stand up. Place your feet flat on the floor, shoulder width apart. Slowly turn your head (rotate) to the right until a slight stretch is felt. Turn it all the way to the right so you can look over your right shoulder. Do not tilt or tip your head. Hold this position for 10-30 seconds. Slowly turn your head (rotate) to the left until a slight stretch is felt. Turn it all the way to the left so you can look over your left shoulder. Do not tilt or tip your head. Hold this position for 10-30 seconds. Repeat __________ times. Complete this exercise __________ times a day. Neck retraction Sit in a sturdy chair or stand up. Look straight ahead. Do not bend your neck. Use your fingers to push your chin backward (retraction). Do not bend your neck for this movement. Continue to face straight ahead. If you are doing the exercise properly, you will feel a slight sensation in your throat and a stretch at the back of your neck. Hold the stretch for 1-2 seconds. Repeat __________ times. Complete this exercise __________ times a day. Strengthening exercises Neck press Lie on your back on a firm bed or on the floor with a pillow under your head. Use your neck muscles to push your head down on the pillow and straighten your spine. Hold the position as well as you can. Keep your head  facing up (in a neutral position) and your chin tucked. Slowly count to 5 while holding this position. Repeat __________ times. Complete this exercise __________ times a day. Isometrics These are exercises in which you strengthen the muscles in your neck while keeping your neck still (isometrics). Sit in a supportive chair and place your hand on your forehead. Keep your head and face facing straight ahead. Do not flex or extend your neck while doing isometrics. Push forward with your head and neck while pushing back with your hand. Hold for 10 seconds. Do the sequence again, this time putting your hand against the back of your head. Use your head and neck to push backward against the hand pressure. Finally, do the same exercise on either side of your head, pushing sideways against the pressure of your hand. Repeat __________ times. Complete this exercise __________ times a day. Prone head lifts Lie face-down (prone position), resting on your elbows so that your chest and upper back are raised. Start with your head facing downward, near your chest. Position your chin either on or near your chest. Slowly lift your head upward. Lift until you are looking straight ahead. Then continue lifting your head as far back as you can comfortably stretch. Hold your head up for 5 seconds. Then slowly lower it to your starting position. Repeat __________ times. Complete this exercise __________ times a day. Supine head lifts Lie on your back (supine position), bending your knees to point to the   ceiling and keeping your feet flat on the floor. Lift your head slowly off the floor, raising your chin toward your chest. Hold for 5 seconds. Repeat __________ times. Complete this exercise __________ times a day. Scapular retraction Stand with your arms at your sides. Look straight ahead. Slowly pull both shoulders (scapulae) backward and downward (retraction) until you feel a stretch between your shoulder blades in  your upper back. Hold for 10-30 seconds. Relax and repeat. Repeat __________ times. Complete this exercise __________ times a day. Contact a health care provider if: Your neck pain or discomfort gets much worse when you do an exercise. Your neck pain or discomfort does not improve within 2 hours after you exercise. If you have any of these problems, stop exercising right away. Do not do the exercises again unless your health care provider says that you can. Get help right away if: You develop sudden, severe neck pain. If this happens, stop exercising right away. Do not do the exercises again unless your health care provider says that you can. This information is not intended to replace advice given to you by your health care provider. Make sure you discuss any questions you have with your healthcare provider. Document Revised: 11/16/2017 Document Reviewed: 11/16/2017 Elsevier Patient Education  2022 [employer] Inc.  
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
## 2020-11-07 ENCOUNTER — Telehealth: Payer: Self-pay

## 2020-11-07 NOTE — Telephone Encounter (Signed)
Copied from [place] [id number]. Topic: General - Other >> Nov 07, 2020  2:47 PM Alanda Slim E wrote: Reason for CRM: Pt would like a call when her xray results come in / Please advise

## 2020-11-10 ENCOUNTER — Encounter: Payer: Self-pay | Admitting: Internal Medicine

## 2020-11-10 DIAGNOSIS — M5412 Radiculopathy, cervical region: Secondary | ICD-10-CM

## 2020-11-27 ENCOUNTER — Encounter: Payer: Self-pay | Admitting: Internal Medicine

## 2020-11-27 ENCOUNTER — Telehealth (INDEPENDENT_AMBULATORY_CARE_PROVIDER_SITE_OTHER): Payer: BC Managed Care – PPO | Admitting: Internal Medicine

## 2020-11-27 ENCOUNTER — Other Ambulatory Visit: Payer: Self-pay

## 2020-11-27 DIAGNOSIS — R590 Localized enlarged lymph nodes: Secondary | ICD-10-CM | POA: Diagnosis not present

## 2020-11-27 MED ORDER — ALBUTEROL SULFATE HFA 108 (90 BASE) MCG/ACT IN AERS: 1.0000 | INHALATION_SPRAY | Freq: Four times a day (QID) | RESPIRATORY_TRACT | 0 refills | Status: DC | PRN

## 2020-11-27 MED ORDER — PREDNISONE 10 MG PO TABS
ORAL_TABLET | ORAL | 0 refills | Status: DC
Start: 2020-11-27 — End: 2020-12-04

## 2020-11-27 MED ORDER — CYCLOBENZAPRINE HCL 10 MG PO TABS: 10.0000 mg | ORAL_TABLET | Freq: Three times a day (TID) | ORAL | 0 refills | Status: DC

## 2020-11-27 MED ORDER — PANTOPRAZOLE SODIUM 40 MG PO TBEC: DELAYED_RELEASE_TABLET | ORAL | 0 refills | Status: DC

## 2020-11-27 NOTE — Patient Instructions (Signed)
Lymphadenopathy Lymphadenopathy means that your lymph glands are swollen or larger than normal. Lymph glands, also called lymph nodes, are collections of tissue that filter excess fluid, bacteria, viruses, and waste from your bloodstream. They are part of your body's disease-fighting system (immune system), which protects your body from germs. There may be different causes of lymphadenopathy, depending on where it is in your body. Some types go away on their own. Lymphadenopathy can occur anywhere that you have lymph glands, including these areas: Neck (cervical lymphadenopathy). Chest (mediastinal lymphadenopathy). Lungs (hilar lymphadenopathy). Underarms (axillary lymphadenopathy). Groin (inguinal lymphadenopathy). When your immune system responds to germs, infection-fighting cells and fluid build up in your lymph glands. This causes some swelling and enlargement. If the lymph nodes do not go back to normal size after you have an infection or disease, your health care provider may do tests. These tests help to monitor your condition and find the reason why the glands are still swollen and enlarged. Follow these instructions at home:  Get plenty of rest. Your health care provider may recommend over-the-counter medicines for pain. Take over-the-counter and prescription medicines only as told by your health care provider. If directed, apply heat to swollen lymph glands as often as told by your health care provider. Use the heat source that your health care provider recommends, such as a moist heat pack or a heating pad. Place a towel between your skin and the heat source. Leave the heat on for 20-30 minutes. Remove the heat if your skin turns bright red. This is especially important if you are unable to feel pain, heat, or cold. You may have a greater risk of getting burned. Check your affected lymph glands every day for changes. Check other lymph gland areas as told by your health care provider.  Check for changes such as: More swelling. Sudden increase in size. Redness or pain. Hardness. Keep all follow-up visits. This is important. Contact a health care provider if you have: Lymph glands that: Are still swollen after 2 weeks. Have suddenly gotten bigger or the swelling spreads. Are red, painful, or hard. Fluid leaking from the skin near an enlarged lymph gland. Problems with breathing. A fever, chills, or night sweats. Fatigue. A sore throat. Pain in your abdomen. Weight loss. Get help right away if you have: Severe pain. Chest pain. Shortness of breath. These symptoms may represent a serious problem that is an emergency. Do not wait to see if the symptoms will go away. Get medical help right away. Call your local emergency services (911 in the [place] Do not drive yourself to the hospital. Summary Lymphadenopathy means that your lymph glands are swollen or larger than normal. Lymph glands, also called lymph nodes, are collections of tissue that filter excess fluid, bacteria, viruses, and waste from the bloodstream. They are part of your body's disease-fighting system (immune system). Lymphadenopathy can occur anywhere that you have lymph glands. If the lymph nodes do not go back to normal size after you have an infection or disease, your health care provider may do tests to monitor your condition and find the reason why the glands are still swollen and enlarged. Check your affected lymph glands every day for changes. Check other lymph gland areas as told by your health care provider. This information is not intended to replace advice given to you by your health care provider. Make sure you discuss any questions you have with your health care provider. Document Revised: 11/14/2019 Document Reviewed: 11/14/2019 Elsevier Patient Education  2022  [employer].
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
## 2020-12-01 ENCOUNTER — Encounter: Payer: Self-pay | Admitting: Internal Medicine

## 2020-12-04 ENCOUNTER — Encounter: Payer: Self-pay | Admitting: Internal Medicine

## 2020-12-04 ENCOUNTER — Ambulatory Visit (INDEPENDENT_AMBULATORY_CARE_PROVIDER_SITE_OTHER): Payer: BC Managed Care – PPO | Admitting: Internal Medicine

## 2020-12-04 ENCOUNTER — Ambulatory Visit: Payer: Self-pay

## 2020-12-04 ENCOUNTER — Other Ambulatory Visit: Payer: Self-pay

## 2020-12-04 VITALS — BP 105/77 | HR 90 | Temp 97.8°F | Resp 18 | Ht 62.0 in | Wt 165.4 lb

## 2020-12-04 DIAGNOSIS — J029 Acute pharyngitis, unspecified: Secondary | ICD-10-CM | POA: Diagnosis not present

## 2020-12-04 DIAGNOSIS — R590 Localized enlarged lymph nodes: Secondary | ICD-10-CM

## 2020-12-04 DIAGNOSIS — R5383 Other fatigue: Secondary | ICD-10-CM | POA: Diagnosis not present

## 2020-12-04 MED ORDER — AMOXICILLIN 875 MG PO TABS: 875.0000 mg | ORAL_TABLET | Freq: Two times a day (BID) | ORAL | 0 refills | Status: DC

## 2020-12-04 NOTE — Telephone Encounter (Signed)
Pt. Reports she was treated last week for swollen lymph node left side of neck. Completed prednisone. Had started feeling better and this morning has a painful, swollen lymph node right side of neck. Last visit note states pt. Would need in office visit if no better. Appointment made for today.     Reason for Disposition  [1] Single large node AND [2] size > 1 inch (2.5 cm) AND [3] no fever  Answer Assessment - Initial Assessment Questions 1. LOCATION: "Where is the swollen node located?" "Is the matching node on the other side of the body also swollen?"      Neck 2. SIZE: "How big is the node?" (e.g., inches or centimeters; or compared to common objects such as pea, bean, marble, golf ball)      uNSURE 3. ONSET: "When did the swelling start?"      This morning 4. NECK NODES: "Is there a sore throat, runny nose or other symptoms of a cold?"      Mild 5. GROIN OR ARMPIT NODES: "Is there a sore, scratch, cut or painful red area on that arm or leg?"      No 6. FEVER: "Do you have a fever?" If Yes, ask: "What is it, how was it measured, and when did it start?"      No 7. CAUSE: "What do you think is causing the swollen lymph nodes?"     Unsure 8. OTHER SYMPTOMS: "Do you have any other symptoms?"     No 9. PREGNANCY: "Is there any chance you are pregnant?" "When was your last menstrual period?"     No  Protocols used: Lymph Nodes - Swollen-A-AH
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
## 2020-12-05 ENCOUNTER — Encounter: Payer: Self-pay | Admitting: Internal Medicine

## 2020-12-05 NOTE — Telephone Encounter (Signed)
Pt called about getting a response to her my chart message/ kelita advised me to advise her she can go to meeting with a mask and results may not be back for mono until Monday /pt is aware

## 2020-12-07 ENCOUNTER — Encounter: Payer: Self-pay | Admitting: Internal Medicine

## 2020-12-07 LAB — CBC
HCT: 46.9 % — ABNORMAL HIGH (ref 35.0–45.0)
Hemoglobin: 15.4 g/dL (ref 11.7–15.5)
MCH: 29.8 pg (ref 27.0–33.0)
MCHC: 32.8 g/dL (ref 32.0–36.0)
MCV: 90.9 fL (ref 80.0–100.0)
MPV: 10.4 fL (ref 7.5–12.5)
Platelets: 300 10*3/uL (ref 140–400)
RBC: 5.16 10*6/uL — ABNORMAL HIGH (ref 3.80–5.10)
RDW: 12.6 % (ref 11.0–15.0)
WBC: 9.3 10*3/uL (ref 3.8–10.8)

## 2020-12-07 LAB — COMPLETE METABOLIC PANEL WITH GFR
AG Ratio: 1.7 (calc) (ref 1.0–2.5)
ALT: 28 U/L (ref 6–29)
AST: 14 U/L (ref 10–30)
Albumin: 4.3 g/dL (ref 3.6–5.1)
Alkaline phosphatase (APISO): 113 U/L (ref 31–125)
BUN: 25 mg/dL (ref 7–25)
CO2: 29 mmol/L (ref 20–32)
Calcium: 10.1 mg/dL (ref 8.6–10.2)
Chloride: 102 mmol/L (ref 98–110)
Creat: 0.72 mg/dL (ref 0.50–0.99)
Globulin: 2.6 g/dL (calc) (ref 1.9–3.7)
Glucose, Bld: 88 mg/dL (ref 65–139)
Potassium: 5 mmol/L (ref 3.5–5.3)
Sodium: 139 mmol/L (ref 135–146)
Total Bilirubin: 0.4 mg/dL (ref 0.2–1.2)
Total Protein: 6.9 g/dL (ref 6.1–8.1)
eGFR: 108 mL/min/{1.73_m2} (ref >=60)

## 2020-12-07 LAB — MONONUCLEOSIS SCREEN: Heterophile, Mono Screen: NEGATIVE

## 2020-12-07 NOTE — Patient Instructions (Signed)

## 2020-12-09 ENCOUNTER — Ambulatory Visit: Payer: BC Managed Care – PPO | Admitting: Internal Medicine

## 2020-12-10 ENCOUNTER — Encounter: Payer: Self-pay | Admitting: Internal Medicine

## 2020-12-11 ENCOUNTER — Other Ambulatory Visit: Payer: Self-pay

## 2020-12-11 ENCOUNTER — Encounter: Payer: Self-pay | Admitting: Internal Medicine

## 2020-12-11 ENCOUNTER — Ambulatory Visit
Admission: EM | Admit: 2020-12-11 | Discharge: 2020-12-11 | Disposition: A | Discharge status: Home / Self Care | Payer: BC Managed Care – PPO | Attending: Internal Medicine | Admitting: Internal Medicine

## 2020-12-11 ENCOUNTER — Encounter: Payer: Self-pay | Admitting: Emergency Medicine

## 2020-12-11 DIAGNOSIS — R590 Localized enlarged lymph nodes: Secondary | ICD-10-CM

## 2020-12-11 LAB — POCT MONO SCREEN (KUC): Mono, POC: NEGATIVE

## 2020-12-11 NOTE — ED Triage Notes (Signed)
Patient states that she had a swollen lymph nodes in her neck on the left side a couple of weeks ago, PCP started her on steroids.  Then the swelling went to the right side and was started on antibiotics.  Now patient is having swelling again on the left side of neck.  Patient is not feeling sick, labs were done, mono was negative.  PCP instructed to go to UC for evaluation.
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
## 2020-12-11 NOTE — Discharge Instructions (Signed)
Please follow-up with your primary care physician for further evaluation and management.

## 2020-12-15 ENCOUNTER — Other Ambulatory Visit: Payer: Self-pay | Admitting: Internal Medicine

## 2020-12-15 NOTE — Telephone Encounter (Signed)
Requested Prescriptions  Pending Prescriptions Disp Refills  . DULoxetine (CYMBALTA) 60 MG capsule [Pharmacy Med Name: DULOXETINE HCL DR 60 MG CAP] 180 capsule 0    Sig: TAKE 1 CAPSULE BY MOUTH 2 TIMES DAILY.     Psychiatry: Antidepressants - SNRI Passed - 12/15/2020  4:23 PM      Passed - Last BP in normal range    BP Readings from Last 1 Encounters:  12/11/20 116/87         Passed - Valid encounter within last 6 months    Recent Outpatient Visits          1 week ago Cervical lymphadenopathy   [facility] [place], Coralie Keens, NP   2 weeks ago Cervical adenopathy   [facility] [place], Coralie Keens, NP   1 month ago Cervical radiculitis   [facility] [place], Coralie Keens, NP   2 months ago Acquired hypothyroidism   [facility] [place], Coralie Keens, NP      Future Appointments            In 3 days Garnette Gunner, Coralie Keens, NP [facility] [place]           . busPIRone (BUSPAR) 10 MG tablet [Pharmacy Med Name: BUSPIRONE HCL 10 MG TABLET] 270 tablet 0    Sig: TAKE 1 TABLET BY MOUTH THREE TIMES A DAY     Psychiatry: Anxiolytics/Hypnotics - Non-controlled Passed - 12/15/2020  4:23 PM      Passed - Valid encounter within last 6 months    Recent Outpatient Visits          1 week ago Cervical lymphadenopathy   [facility], Coralie Keens, NP   2 weeks ago Cervical adenopathy   [facility] [place], Coralie Keens, NP   1 month ago Cervical radiculitis   [facility] [place], Coralie Keens, NP   2 months ago Acquired hypothyroidism   [facility] [place], Coralie Keens, NP      Future Appointments            In 3 days [place], Coralie Keens, NP [facility]

## 2020-12-18 ENCOUNTER — Encounter: Payer: Self-pay | Admitting: Internal Medicine

## 2020-12-18 ENCOUNTER — Ambulatory Visit (INDEPENDENT_AMBULATORY_CARE_PROVIDER_SITE_OTHER): Payer: BC Managed Care – PPO | Admitting: Internal Medicine

## 2020-12-18 ENCOUNTER — Other Ambulatory Visit: Payer: Self-pay

## 2020-12-18 VITALS — BP 120/87 | HR 86 | Temp 97.3°F | Resp 17 | Ht 63.0 in | Wt 169.2 lb

## 2020-12-18 DIAGNOSIS — M5412 Radiculopathy, cervical region: Secondary | ICD-10-CM | POA: Diagnosis not present

## 2020-12-18 DIAGNOSIS — R590 Localized enlarged lymph nodes: Secondary | ICD-10-CM | POA: Diagnosis not present
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
## 2020-12-18 NOTE — Patient Instructions (Signed)
Cervical Radiculopathy ?Cervical radiculopathy means that a nerve in the neck (a cervical nerve) is pinched or bruised. This can happen because of an injury to the cervical spine (vertebrae) in the neck, or as a normal part of getting older. This condition can cause pain or loss of feeling (numbness) that runs from your neck all the way down to your arm and fingers. Often, this condition gets better with rest. Treatment may be needed if the condition does not get better. ?What are the causes? ?A neck injury. ?A bulging disk in your spine. ?Sudden muscle tightening (muscle spasms). ?Tight muscles in your neck due to overuse. ?Arthritis. ?Breakdown in the bones and joints of the spine (spondylosis) due to getting older. ?Bone spurs that form near the nerves in the neck. ?What are the signs or symptoms? ?Pain. The pain may: ?Run from the neck to the arm and hand. ?Be very bad or irritating. ?Get worse when you move your neck. ?Loss of feeling or tingling in your arm or hand. ?Weakness in your arm or hand, in very bad cases. ?How is this treated? ?In many cases, treatment is not needed for this condition. With rest, the condition often gets better over time. If treatment is needed, options may include: ?Wearing a soft neck collar (cervical collar) for short periods of time. ?Doing exercises (physical therapy) to strengthen your neck muscles. ?Taking medicines. ?Having shots (injections) in your spine, in very bad cases. ?Having surgery. This may be needed if other treatments do not help. The type of surgery that is used will depend on the cause of your condition. ?Follow these instructions at home: ?If you have a soft neck collar: ?Wear it as told by your doctor. Take it off only as told by your doctor. ?Ask your doctor if you can take the collar off for cleaning and bathing. If you are allowed to take the collar off for cleaning or bathing: ?Follow instructions from your doctor about how to take off the collar  safely. ?Clean the collar by wiping it with mild soap and water and drying it completely. ?Take out any removable pads in the collar every 1-2 days. Wash them by hand with soap and water. Let them air-dry completely before you put them back in the collar. ?Check your skin under the collar for redness or sores. If you see any, tell your doctor. ?Managing pain ?  ?Take over-the-counter and prescription medicines only as told by your doctor. ?If told, put ice on the painful area. To do this: ?If you have a soft neck collar, take if off as told by your doctor. ?Put ice in a plastic bag. ?Place a towel between your skin and the bag. ?Leave the ice on for 20 minutes, 2-3 times a day. ?Take off the ice if your skin turns bright red. This is very important. If you cannot feel pain, heat, or cold, you have a greater risk of damage to the area. ?If using ice does not help, you can try using heat. Use the heat source that your doctor recommends, such as a moist heat pack or a heating pad. ?Place a towel between your skin and the heat source. ?Leave the heat on for 20-30 minutes. ?Take off the heat if your skin turns bright red. This is very important. If you cannot feel pain, heat, or cold, you have a greater risk of getting burned. ?You may try a gentle neck and shoulder rub (massage). ?Activity ?Rest as needed. ?Return to your normal   activities when your doctor says that it is safe. ?Do exercises as told by your doctor or physical therapist. ?You may have to avoid lifting. Ask your doctor how much you can safely lift. ?General instructions ?Use a flat pillow when you sleep. ?Do not drive while wearing a soft neck collar. If you do not have a soft neck collar, ask your doctor if it is safe to drive while your neck heals. ?Ask your doctor if you should avoid driving or using machines while you are taking your medicine. ?Do not smoke or use any products that contain nicotine or tobacco. If you need help quitting, ask your  doctor. ?Keep all follow-up visits. ?Contact a doctor if: ?Your condition does not get better with treatment. ?Get help right away if: ?Your pain gets worse and medicine does not help. ?You lose feeling or feel weak in your hand, arm, face, or leg. ?You have a high fever. ?Your neck is stiff. ?You cannot control when you poop or pee (have incontinence). ?You have trouble with walking, balance, or talking. ?Summary ?Cervical radiculopathy means that a nerve in the neck is pinched or bruised. ?A nerve can get pinched from a bulging disk, arthritis, an injury to the neck, or other causes. ?Symptoms include pain, tingling, or loss of feeling that goes from the neck to the arm or hand. ?Weakness in your arm or hand can happen in very bad cases. ?Treatment may include resting, wearing a soft neck collar, and doing exercises. You might need to take medicines for pain. In very bad cases, shots or surgery may be needed. ?This information is not intended to replace advice given to you by your health care provider. Make sure you discuss any questions you have with your health care provider. ?Document Revised: 07/24/2020 Document Reviewed: 07/24/2020 ?Elsevier Patient Education ? 2022 [employer] ? ?

## 2020-12-19 ENCOUNTER — Other Ambulatory Visit: Payer: Self-pay | Admitting: Internal Medicine

## 2020-12-19 NOTE — Telephone Encounter (Signed)
Copied from [place] [id number]. Topic: General - Other >> Dec 19, 2020  9:14 AM Yvette Rack wrote: Reason for CRM: Pt stated she was returning call from a missed call she had. Cb# [phone]

## 2020-12-19 NOTE — Telephone Encounter (Signed)
Requested medications are due for refill today requesting a little early  Requested medications are on the active medication list yes  Last refill 11/27/20  Last visit 11/27/20  Future visit scheduled no  Notes to clinic ordered at a visit but ordered with no refills, unsure if to be continued. Please assess. Requested Prescriptions  Pending Prescriptions Disp Refills   albuterol (VENTOLIN HFA) 108 (90 Base) MCG/ACT inhaler [Pharmacy Med Name: ALBUTEROL HFA (PROAIR) INHALER] 8.5 each     Sig: INHALE 1-2 PUFFS INTO THE LUNGS EVERY 6 HOURS AS NEEDED.     Pulmonology:  Beta Agonists Failed - 12/19/2020  1:41 AM      Failed - One inhaler should last at least one month. If the patient is requesting refills earlier, contact the patient to check for uncontrolled symptoms.      Passed - Valid encounter within last 12 months    Recent Outpatient Visits           Yesterday Cervical adenopathy   [facility] [place], Coralie Keens, NP   2 weeks ago Cervical lymphadenopathy   [facility] [place], Coralie Keens, NP   3 weeks ago Cervical adenopathy   [facility] [place], Coralie Keens, NP   1 month ago Cervical radiculitis   [facility] [place], Coralie Keens, NP   2 months ago Acquired hypothyroidism   [facility] [place], Coralie Keens, [place]

## 2020-12-28 ENCOUNTER — Other Ambulatory Visit: Payer: Self-pay

## 2020-12-28 ENCOUNTER — Encounter: Payer: Self-pay | Admitting: Internal Medicine

## 2020-12-28 ENCOUNTER — Encounter (HOSPITAL_COMMUNITY): Payer: Self-pay | Admitting: Emergency Medicine

## 2020-12-28 ENCOUNTER — Emergency Department (HOSPITAL_COMMUNITY): Payer: BC Managed Care – PPO

## 2020-12-28 ENCOUNTER — Emergency Department (HOSPITAL_COMMUNITY)
Admission: EM | Admit: 2020-12-28 | Discharge: 2020-12-28 | Disposition: A | Discharge status: Home / Self Care | Payer: BC Managed Care – PPO | Attending: Emergency Medicine | Admitting: Emergency Medicine

## 2020-12-28 DIAGNOSIS — R079 Chest pain, unspecified: Secondary | ICD-10-CM | POA: Insufficient documentation

## 2020-12-28 DIAGNOSIS — Z20822 Contact with and (suspected) exposure to covid-19: Secondary | ICD-10-CM | POA: Diagnosis not present

## 2020-12-28 DIAGNOSIS — Z79899 Other long term (current) drug therapy: Secondary | ICD-10-CM | POA: Insufficient documentation

## 2020-12-28 DIAGNOSIS — R519 Headache, unspecified: Secondary | ICD-10-CM | POA: Diagnosis not present

## 2020-12-28 DIAGNOSIS — E039 Hypothyroidism, unspecified: Secondary | ICD-10-CM | POA: Diagnosis not present

## 2020-12-28 DIAGNOSIS — R0602 Shortness of breath: Secondary | ICD-10-CM | POA: Diagnosis not present

## 2020-12-28 DIAGNOSIS — Z87891 Personal history of nicotine dependence: Secondary | ICD-10-CM | POA: Diagnosis not present

## 2020-12-28 LAB — BASIC METABOLIC PANEL
Anion gap: 10 (ref 5–15)
BUN: 16 mg/dL (ref 6–20)
CO2: 24 mmol/L (ref 22–32)
Calcium: 9.1 mg/dL (ref 8.9–10.3)
Chloride: 105 mmol/L (ref 98–111)
Creatinine, Ser: 0.78 mg/dL (ref 0.44–1.00)
GFR, Estimated: >60 mL/min (ref >60)
Glucose, Bld: 132 mg/dL — ABNORMAL HIGH (ref 70–99)
Potassium: 3.9 mmol/L (ref 3.5–5.1)
Sodium: 139 mmol/L (ref 135–145)

## 2020-12-28 LAB — TROPONIN I (HIGH SENSITIVITY)
Troponin I (High Sensitivity): 3 ng/L (ref <18)
Troponin I (High Sensitivity): <2 ng/L (ref <18)

## 2020-12-28 LAB — D-DIMER, QUANTITATIVE: D-Dimer, Quant: <0.27 ug/mL-FEU (ref 0.00–0.50)

## 2020-12-28 LAB — CBC
HCT: 44.4 % (ref 36.0–46.0)
Hemoglobin: 14.7 g/dL (ref 12.0–15.0)
MCH: 30.3 pg (ref 26.0–34.0)
MCHC: 33.1 g/dL (ref 30.0–36.0)
MCV: 91.5 fL (ref 80.0–100.0)
Platelets: 225 10*3/uL (ref 150–400)
RBC: 4.85 MIL/uL (ref 3.87–5.11)
RDW: 12.9 % (ref 11.5–15.5)
WBC: 5.9 10*3/uL (ref 4.0–10.5)
nRBC: 0 % (ref 0.0–0.2)

## 2020-12-28 LAB — HEPATIC FUNCTION PANEL
ALT: 34 U/L (ref 0–44)
AST: 29 U/L (ref 15–41)
Albumin: 3.7 g/dL (ref 3.5–5.0)
Alkaline Phosphatase: 111 U/L (ref 38–126)
Bilirubin, Direct: <0.1 mg/dL (ref 0.0–0.2)
Total Bilirubin: 0.4 mg/dL (ref 0.3–1.2)
Total Protein: 6.1 g/dL — ABNORMAL LOW (ref 6.5–8.1)

## 2020-12-28 LAB — RESP PANEL BY RT-PCR (FLU A&B, COVID) ARPGX2
Influenza A by PCR: NEGATIVE
Influenza B by PCR: NEGATIVE
SARS Coronavirus 2 by RT PCR: NEGATIVE

## 2020-12-28 MED ORDER — ACETAMINOPHEN 500 MG PO TABS
1000.0000 mg | ORAL_TABLET | Freq: Once | ORAL | Status: AC
  Administered 2020-12-28: 12:00:00 1000 mg via ORAL
  Filled 2020-12-28: qty 2

## 2020-12-28 NOTE — ED Triage Notes (Signed)
C/o R sided chest pain, SOB, and headache since last night.  Denies nausea/vomiting.  Went to an [facility] and sent to ED.
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
## 2020-12-28 NOTE — ED Provider Notes (Cosign Needed)
Emergency Medicine Provider Triage Evaluation Note  NIKITHA MODE , a 40 y.o. female  was evaluated in triage.  Pt complains of chest pain.  Review of Systems  Positive: Headache, chest pain, sob Negative: Fever, chills, cough, congestion, n/v/d  Physical Exam  BP 117/86 (BP Location: Right Arm)   Pulse 83   Temp 98 F (36.7 C) (Oral)   Resp 18   SpO2 98%  Gen:   Awake, no distress   Resp:  Normal effort  MSK:   Moves extremities without difficulty  Other:    Medical Decision Making  Medically screening exam initiated at 10:21 AM.  Appropriate orders placed.  Asya Derryberry Arlen was informed that the remainder of the evaluation will be completed by another provider, this initial triage assessment does not replace that evaluation, and the importance of remaining in the ED until their evaluation is complete.  Report having sharp pain to chest, along with SOB and headache that started since last night.  No cold sxs.     Domenic Moras, PA-C 12/28/20 1025

## 2020-12-30 ENCOUNTER — Emergency Department (HOSPITAL_COMMUNITY)
Admission: EM | Admit: 2020-12-30 | Discharge: 2020-12-31 | Disposition: A | Discharge status: Home / Self Care | Payer: BC Managed Care – PPO | Attending: Emergency Medicine | Admitting: Emergency Medicine

## 2020-12-30 DIAGNOSIS — R0602 Shortness of breath: Secondary | ICD-10-CM | POA: Diagnosis not present

## 2020-12-30 DIAGNOSIS — R109 Unspecified abdominal pain: Secondary | ICD-10-CM | POA: Diagnosis not present

## 2020-12-30 DIAGNOSIS — F1910 Other psychoactive substance abuse, uncomplicated: Secondary | ICD-10-CM | POA: Diagnosis present

## 2020-12-30 DIAGNOSIS — E039 Hypothyroidism, unspecified: Secondary | ICD-10-CM | POA: Diagnosis not present

## 2020-12-30 DIAGNOSIS — Z79899 Other long term (current) drug therapy: Secondary | ICD-10-CM | POA: Insufficient documentation

## 2020-12-30 DIAGNOSIS — Z87891 Personal history of nicotine dependence: Secondary | ICD-10-CM | POA: Insufficient documentation

## 2020-12-30 LAB — COMPREHENSIVE METABOLIC PANEL
ALT: 38 U/L (ref 0–44)
AST: 31 U/L (ref 15–41)
Albumin: 4.3 g/dL (ref 3.5–5.0)
Alkaline Phosphatase: 131 U/L — ABNORMAL HIGH (ref 38–126)
Anion gap: 10 (ref 5–15)
BUN: 6 mg/dL (ref 6–20)
CO2: 26 mmol/L (ref 22–32)
Calcium: 9.7 mg/dL (ref 8.9–10.3)
Chloride: 103 mmol/L (ref 98–111)
Creatinine, Ser: 0.78 mg/dL (ref 0.44–1.00)
GFR, Estimated: >60 mL/min (ref >60)
Glucose, Bld: 111 mg/dL — ABNORMAL HIGH (ref 70–99)
Potassium: 4.1 mmol/L (ref 3.5–5.1)
Sodium: 139 mmol/L (ref 135–145)
Total Bilirubin: 1.2 mg/dL (ref 0.3–1.2)
Total Protein: 7.3 g/dL (ref 6.5–8.1)

## 2020-12-30 LAB — CBC
HCT: 47.9 % — ABNORMAL HIGH (ref 36.0–46.0)
Hemoglobin: 16 g/dL — ABNORMAL HIGH (ref 12.0–15.0)
MCH: 30.3 pg (ref 26.0–34.0)
MCHC: 33.4 g/dL (ref 30.0–36.0)
MCV: 90.7 fL (ref 80.0–100.0)
Platelets: 240 10*3/uL (ref 150–400)
RBC: 5.28 MIL/uL — ABNORMAL HIGH (ref 3.87–5.11)
RDW: 13 % (ref 11.5–15.5)
WBC: 9.6 10*3/uL (ref 4.0–10.5)
nRBC: 0 % (ref 0.0–0.2)

## 2020-12-30 LAB — ETHANOL: Alcohol, Ethyl (B): <10 mg/dL (ref <10)

## 2020-12-30 NOTE — ED Triage Notes (Signed)
Pt here POV d/t using crack, yesterday.

## 2020-12-30 NOTE — ED Provider Notes (Signed)
Emergency Medicine Provider Triage Evaluation Note  Lindsey Davenport , a 40 y.o. female  was evaluated in triage.  Pt complains of relapse.  Patient states that she was discharged from the emergency department on 11/27, and relapsed with crack cocaine yesterday.  She is unsure if she has anything today.  Denies any history of IV drug use.  She denies pain or any other complaints.  Review of Systems  Positive: Anxiety, substance abuse Negative: Suicidal or homicidal ideation  Physical Exam  BP (!) 154/109 (BP Location: Left Arm)   Pulse 86   Temp 98.3 F (36.8 C) (Oral)   Resp 18   SpO2 100%  Gen:   Awake, no distress   Resp:  Normal effort  MSK:   Moves extremities without difficulty  Other:  Patient tearful in triage, alert and oriented x3  Medical Decision Making  Medically screening exam initiated at 3:17 PM.  Appropriate orders placed.  Atley Scarboro Bracher was informed that the remainder of the evaluation will be completed by another provider, this initial triage assessment does not replace that evaluation, and the importance of remaining in the ED until their evaluation is complete.     Estill Cotta 12/30/20 1528    Carmin Muskrat, MD 01/03/21 1422

## 2020-12-31 LAB — TROPONIN I (HIGH SENSITIVITY): Troponin I (High Sensitivity): 10 ng/L (ref <18)

## 2020-12-31 MED ORDER — LACTATED RINGERS IV BOLUS
1000.0000 mL | Freq: Once | INTRAVENOUS | Status: AC
  Administered 2020-12-31: 1000 mL via INTRAVENOUS

## 2020-12-31 MED ORDER — LORAZEPAM 1 MG PO TABS
0.5000 mg | ORAL_TABLET | Freq: Once | ORAL | Status: AC
  Administered 2020-12-31: 0.5 mg via SUBLINGUAL
  Filled 2020-12-31: qty 1

## 2020-12-31 NOTE — Social Work (Signed)
CSW met with Pt at bedside. Pt requesting detox from cocaine.  CSW spoke with [facility] who state they currently have 4 beds.  CSW explained that [facility] in [place] with assess onsite.  CSW arranged transportation via [employer].  While with Pt, CSW called Pt's mother Kim [phone] Madelin Headings to request that mother come to ED to pick up Pts car.  Pt's care keys are in social work office. Mom will call social work # to [phone].
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
## 2021-01-05 ENCOUNTER — Ambulatory Visit: Admission: RE | Admit: 2021-01-05 | Payer: BC Managed Care – PPO | Source: Ambulatory Visit

## 2021-01-05 ENCOUNTER — Ambulatory Visit: Payer: BC Managed Care – PPO

## 2021-01-22 ENCOUNTER — Other Ambulatory Visit: Payer: Self-pay | Admitting: Internal Medicine

## 2021-01-22 DIAGNOSIS — E039 Hypothyroidism, unspecified: Secondary | ICD-10-CM

## 2021-01-22 NOTE — Telephone Encounter (Signed)
Requested Prescriptions  Pending Prescriptions Disp Refills   rOPINIRole (REQUIP) 1 MG tablet [Pharmacy Med Name: ROPINIROLE HCL 1 MG TABLET] 90 tablet 0    Sig: TAKE 1 TABLET BY MOUTH AT BEDTIME.     Neurology:  Parkinsonian Agents Failed - 01/22/2021  1:36 AM      Failed - Last BP in normal range    BP Readings from Last 1 Encounters:  12/31/20 (!) 112/98         Passed - Valid encounter within last 12 months    Recent Outpatient Visits          1 month ago Cervical adenopathy   [facility] [place], Coralie Keens, NP   1 month ago Cervical lymphadenopathy   [facility] [place], Coralie Keens, NP   1 month ago Cervical adenopathy   [facility] [place], Coralie Keens, NP   2 months ago Cervical radiculitis   [facility] [place], Coralie Keens, NP   3 months ago Acquired hypothyroidism   [facility] [place], Coralie Keens, NP              levothyroxine (SYNTHROID) 25 MCG tablet [Pharmacy Med Name: LEVOTHYROXINE 25 MCG TABLET] 90 tablet 0    Sig: TAKE 1 TABLET BY MOUTH [place]     Endocrinology:  Hypothyroid Agents Failed - 01/22/2021  1:36 AM      Failed - TSH needs to be rechecked within 3 months after an abnormal result. Refill until TSH is due.      Passed - TSH in normal range and within 360 days    TSH  Date Value Ref Range Status  10/02/2020 3.75 mIU/L Final    Comment:              Reference Range .           > or = 20 Years  0.40-4.50 .                Pregnancy Ranges           First trimester    0.26-2.66           Second trimester   0.55-2.73           Third trimester    0.43-2.91          Passed - Valid encounter within last 12 months    Recent Outpatient Visits          1 month ago Cervical adenopathy   [facility] [place], Coralie Keens, NP   1 month ago Cervical lymphadenopathy   [facility] [place], Coralie Keens, NP   1 month ago Cervical adenopathy   [facility] [place], Coralie Keens, NP   2 months ago Cervical radiculitis   [facility] [place], Coralie Keens, NP   3 months ago Acquired hypothyroidism   [facility] [place], Coralie Keens, [place]

## 2021-02-22 ENCOUNTER — Other Ambulatory Visit: Payer: Self-pay | Admitting: Internal Medicine

## 2021-02-22 NOTE — Telephone Encounter (Signed)
Requested medication (s) are due for refill today: yes  Requested medication (s) are on the active medication list: yes  Last refill:  11/27/20 #90  Future visit scheduled: no  Notes to clinic:  Sig was to take med for 14 days- please review and change the Sig if appropriate   Requested Prescriptions  Pending Prescriptions Disp Refills   pantoprazole (PROTONIX) 40 MG tablet [Pharmacy Med Name: PANTOPRAZOLE SOD DR 40 MG TAB] 90 tablet 0    Sig: TAKE 1 TABLET BY MOUTH EVERY DAY FOR 2 WEEKS     Gastroenterology: Proton Pump Inhibitors Passed - 02/22/2021 10:03 AM      Passed - Valid encounter within last 12 months    Recent Outpatient Visits           2 months ago Cervical adenopathy   [facility] [place], Coralie Keens, NP   2 months ago Cervical lymphadenopathy   [facility] [place], Coralie Keens, NP   2 months ago Cervical adenopathy   [facility] [place], Coralie Keens, NP   3 months ago Cervical radiculitis   [facility] [place], Coralie Keens, NP   4 months ago Acquired hypothyroidism   [facility] [place], Coralie Keens, NP

## 2021-03-24 DIAGNOSIS — R079 Chest pain, unspecified: Secondary | ICD-10-CM | POA: Diagnosis not present

## 2021-03-29 ENCOUNTER — Encounter: Payer: Self-pay | Admitting: Emergency Medicine

## 2021-03-29 ENCOUNTER — Other Ambulatory Visit: Payer: Self-pay

## 2021-03-29 ENCOUNTER — Emergency Department
Admission: EM | Admit: 2021-03-29 | Discharge: 2021-03-29 | Disposition: A | Discharge status: Home / Self Care | Payer: BC Managed Care – PPO | Attending: Emergency Medicine | Admitting: Emergency Medicine

## 2021-03-29 ENCOUNTER — Emergency Department: Payer: BC Managed Care – PPO

## 2021-03-29 DIAGNOSIS — E039 Hypothyroidism, unspecified: Secondary | ICD-10-CM | POA: Diagnosis not present

## 2021-03-29 DIAGNOSIS — J329 Chronic sinusitis, unspecified: Secondary | ICD-10-CM

## 2021-03-29 DIAGNOSIS — J40 Bronchitis, not specified as acute or chronic: Secondary | ICD-10-CM | POA: Diagnosis not present

## 2021-03-29 DIAGNOSIS — R0602 Shortness of breath: Secondary | ICD-10-CM | POA: Diagnosis present

## 2021-03-29 DIAGNOSIS — L299 Pruritus, unspecified: Secondary | ICD-10-CM

## 2021-03-29 DIAGNOSIS — Z79899 Other long term (current) drug therapy: Secondary | ICD-10-CM | POA: Insufficient documentation

## 2021-03-29 DIAGNOSIS — Z20822 Contact with and (suspected) exposure to covid-19: Secondary | ICD-10-CM | POA: Insufficient documentation

## 2021-03-29 DIAGNOSIS — R7401 Elevation of levels of liver transaminase levels: Secondary | ICD-10-CM | POA: Insufficient documentation

## 2021-03-29 DIAGNOSIS — Z87891 Personal history of nicotine dependence: Secondary | ICD-10-CM | POA: Insufficient documentation

## 2021-03-29 DIAGNOSIS — I10 Essential (primary) hypertension: Secondary | ICD-10-CM | POA: Insufficient documentation

## 2021-03-29 DIAGNOSIS — Z8616 Personal history of COVID-19: Secondary | ICD-10-CM | POA: Insufficient documentation

## 2021-03-29 LAB — COMPREHENSIVE METABOLIC PANEL
ALT: 16 U/L (ref 0–44)
AST: 16 U/L (ref 15–41)
Albumin: 3.8 g/dL (ref 3.5–5.0)
Alkaline Phosphatase: 97 U/L (ref 38–126)
Anion gap: 10 (ref 5–15)
BUN: 8 mg/dL (ref 6–20)
CO2: 26 mmol/L (ref 22–32)
Calcium: 9.2 mg/dL (ref 8.9–10.3)
Chloride: 107 mmol/L (ref 98–111)
Creatinine, Ser: 0.53 mg/dL (ref 0.44–1.00)
GFR, Estimated: >60 mL/min (ref >60)
Glucose, Bld: 93 mg/dL (ref 70–99)
Potassium: 4 mmol/L (ref 3.5–5.1)
Sodium: 143 mmol/L (ref 135–145)
Total Bilirubin: 0.5 mg/dL (ref 0.3–1.2)
Total Protein: 6.1 g/dL — ABNORMAL LOW (ref 6.5–8.1)

## 2021-03-29 LAB — CBC WITH DIFFERENTIAL/PLATELET
Abs Immature Granulocytes: 0 10*3/uL (ref 0.00–0.07)
Basophils Absolute: 0 10*3/uL (ref 0.0–0.1)
Basophils Relative: 0 %
Eosinophils Absolute: 0.2 10*3/uL (ref 0.0–0.5)
Eosinophils Relative: 3 %
HCT: 41.9 % (ref 36.0–46.0)
Hemoglobin: 14.1 g/dL (ref 12.0–15.0)
Immature Granulocytes: 0 %
Lymphocytes Relative: 58 %
Lymphs Abs: 3.4 10*3/uL (ref 0.7–4.0)
MCH: 30.2 pg (ref 26.0–34.0)
MCHC: 33.7 g/dL (ref 30.0–36.0)
MCV: 89.7 fL (ref 80.0–100.0)
Monocytes Absolute: 0.2 10*3/uL (ref 0.1–1.0)
Monocytes Relative: 4 %
Neutro Abs: 2.1 10*3/uL (ref 1.7–7.7)
Neutrophils Relative %: 35 %
Platelets: 233 10*3/uL (ref 150–400)
RBC: 4.67 MIL/uL (ref 3.87–5.11)
RDW: 12.8 % (ref 11.5–15.5)
WBC: 5.9 10*3/uL (ref 4.0–10.5)
nRBC: 0 % (ref 0.0–0.2)

## 2021-03-29 LAB — RESP PANEL BY RT-PCR (FLU A&B, COVID) ARPGX2
Influenza A by PCR: NEGATIVE
Influenza B by PCR: NEGATIVE
SARS Coronavirus 2 by RT PCR: NEGATIVE

## 2021-03-29 LAB — TROPONIN I (HIGH SENSITIVITY): Troponin I (High Sensitivity): 3 ng/L (ref <18)

## 2021-03-29 MED ORDER — AMOXICILLIN-POT CLAVULANATE 875-125 MG PO TABS
1.0000 | ORAL_TABLET | Freq: Two times a day (BID) | ORAL | 0 refills | Status: AC
Start: 2021-03-29 — End: 2021-04-08

## 2021-03-29 NOTE — Discharge Instructions (Addendum)
You have been seen today in the emergency room and diagnosed with bronchitis/sinusitis.  You have been prescribed an antibiotic that you should take the entire course of. You were also seen for pruritus.  The cause of this has not been determined and you should follow-up with your primary care doctor for further evaluation.

## 2021-03-29 NOTE — ED Notes (Signed)
Discharge instructions and follow-up information provided. Patient verbalized understanding. Patient ambulated out to the waiting room with a steady gait.
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
## 2021-03-29 NOTE — ED Triage Notes (Addendum)
Pt via POV from home. Pt c/o itching all over, states that it has been going on for 2 weeks. States that she thought it may have been from an antibiotic she was on but she has not been on it for a week. Denies any pain. Denies any fever. States benadryl does not help. Pt also endorses some cough and chest congestion. Pt is A&OX4 and NAD.

## 2021-05-11 ENCOUNTER — Encounter: Payer: Self-pay | Admitting: Cardiology

## 2021-05-11 NOTE — Progress Notes (Signed)
Unable to contact patient to schedule, letter sent. Order cancelled

## 2021-09-07 ENCOUNTER — Encounter: Payer: Self-pay | Admitting: Emergency Medicine

## 2021-09-07 ENCOUNTER — Other Ambulatory Visit: Payer: Self-pay

## 2021-09-07 ENCOUNTER — Emergency Department: Payer: Medicaid Other

## 2021-09-07 ENCOUNTER — Emergency Department: Payer: Self-pay

## 2021-09-07 ENCOUNTER — Emergency Department
Admission: EM | Admit: 2021-09-07 | Discharge: 2021-09-07 | Disposition: A | Discharge status: Home / Self Care | Payer: Self-pay | Attending: Emergency Medicine | Admitting: Emergency Medicine

## 2021-09-07 DIAGNOSIS — R0789 Other chest pain: Secondary | ICD-10-CM | POA: Insufficient documentation

## 2021-09-07 DIAGNOSIS — R0602 Shortness of breath: Secondary | ICD-10-CM | POA: Insufficient documentation

## 2021-09-07 DIAGNOSIS — R079 Chest pain, unspecified: Secondary | ICD-10-CM

## 2021-09-07 LAB — CBC
HCT: 42 % (ref 36.0–46.0)
Hemoglobin: 14.2 g/dL (ref 12.0–15.0)
MCH: 30.6 pg (ref 26.0–34.0)
MCHC: 33.8 g/dL (ref 30.0–36.0)
MCV: 90.5 fL (ref 80.0–100.0)
Platelets: 192 10*3/uL (ref 150–400)
RBC: 4.64 MIL/uL (ref 3.87–5.11)
RDW: 12.6 % (ref 11.5–15.5)
WBC: 4.8 10*3/uL (ref 4.0–10.5)
nRBC: 0 % (ref 0.0–0.2)

## 2021-09-07 LAB — BASIC METABOLIC PANEL
Anion gap: 5 (ref 5–15)
BUN: 13 mg/dL (ref 6–20)
CO2: 24 mmol/L (ref 22–32)
Calcium: 9.2 mg/dL (ref 8.9–10.3)
Chloride: 112 mmol/L — ABNORMAL HIGH (ref 98–111)
Creatinine, Ser: 0.68 mg/dL (ref 0.44–1.00)
GFR, Estimated: >60 mL/min (ref >60)
Glucose, Bld: 122 mg/dL — ABNORMAL HIGH (ref 70–99)
Potassium: 3.8 mmol/L (ref 3.5–5.1)
Sodium: 141 mmol/L (ref 135–145)

## 2021-09-07 LAB — POC URINE PREG, ED: Preg Test, Ur: NEGATIVE

## 2021-09-07 LAB — TROPONIN I (HIGH SENSITIVITY): Troponin I (High Sensitivity): <2 ng/L (ref <18)

## 2021-09-07 MED ORDER — IOHEXOL 350 MG/ML SOLN
75.0000 mL | Freq: Once | INTRAVENOUS | Status: AC | PRN
  Administered 2021-09-07: 75 mL via INTRAVENOUS

## 2021-09-07 NOTE — ED Notes (Signed)
Patient called for room with no answer from lobby. Called X-ray and state they attempted to find pt about 30 mins ago and patient did not answer.

## 2021-09-07 NOTE — ED Provider Notes (Signed)
[facility] Provider Note    Event Date/Time   First MD Initiated Contact with Patient 09/07/21 1337     (approximate)  History   Chief Complaint: Chest Pain  HPI  CARMINE YOUNGBERG is a 41 y.o. female with a past medical history of gastric reflux, anxiety, presents to the emergency department for chest and back pain.  According to the patient over the past 3 days she has been experiencing pain in the center of her chest that is now radiating to the back between the shoulder blades.  Patient states the pain is worse if she takes a deep inspiration.  No history of blood clot previously.  No leg pain or swelling.  No estrogen or hormonal use.  Patient does not know of any family cardiac history.  Denies any nausea.  Does states some slight shortness of breath at times.  Physical Exam   Triage Vital Signs: ED Triage Vitals  Enc Vitals Group     BP 09/07/21 1107 119/86     Pulse Rate 09/07/21 1107 (!) 52     Resp 09/07/21 1107 18     Temp --      Temp src --      SpO2 09/07/21 1107 100 %     Weight 09/07/21 1103 150 lb (68 kg)     Height 09/07/21 1103 '5\' 3"'$  (1.6 m)     Head Circumference --      Peak Flow --      Pain Score 09/07/21 1103 8     Pain Loc --      Pain Edu? --      Excl. in [place]? --     Most recent vital signs: Vitals:   09/07/21 1107  BP: 119/86  Pulse: (!) 52  Resp: 18  SpO2: 100%    General: Awake, no distress.  CV:  Good peripheral perfusion.  Regular rate and rhythm  Resp:  Normal effort.  Equal breath sounds bilaterally.  Chest is nontender to palpation Abd:  No distention.  Soft, nontender.  No rebound or guarding. Other:  No lower extremity tenderness or edema.   ED Results / Procedures / Treatments   EKG  EKG viewed and interpreted by myself shows sinus bradycardia 53 bpm with a narrow QRS, left axis deviation, largely normal intervals with nonspecific but no concerning ST changes.  RADIOLOGY  I have personally  seen and interpreted the CT images I do not see any large emboli on my evaluation. Radiology has read the CT scan is negative for PE.   MEDICATIONS ORDERED IN ED: Medications - No data to display   IMPRESSION / MDM / [place] / ED COURSE  I reviewed the triage vital signs and the nursing notes.  Patient's presentation is most consistent with acute presentation with potential threat to life or bodily function.  Patient presents to the emergency department with chest pain x3 days that radiates to her back.  Patient states worse with deep inspiration or certain movements.  It is not reproducible to palpation.  No leg pain or swelling no hormonal therapy.  Vital signs are reassuring including a normal pulse rate and 100% room air saturation.  However given the patient's description of the pain in the center of her chest radiating to the middle of the back we will obtain CTA imaging of the chest to help evaluate for pulmonary embolism or dissection although much lower on the differential.  Differential would include  chest wall discomfort/musculoskeletal pain, pneumonia, pneumothorax.  Lab work is reassuring including a normal CBC, normal chemistry and a negative troponin.  Work-up is reassuring.  Pregnancy test negative.  Chemistry and CBC normal.  Troponin negative.  CTA negative.  We will discharge the patient with PCP follow-up.  FINAL CLINICAL IMPRESSION(S) / ED DIAGNOSES   Chest pain  Note:  This document was prepared using Dragon voice recognition software and may include unintentional dictation errors.   Harvest Dark, MD 09/07/21 1500

## 2021-09-07 NOTE — ED Triage Notes (Signed)
Pt to ED via POV from home. Pt reports centralized CP that radiates between the shoulder blades that started today. Pt also reports bilateral arm tingling x1 week.

## 2021-09-07 NOTE — ED Notes (Signed)
Assumed care, pt reports "catch" in her back that woke her this AM. States also has had Bilateral hand and arm numbness x 3 days. Denies cardiac hx.

## 2021-11-16 ENCOUNTER — Emergency Department
Admission: EM | Admit: 2021-11-16 | Discharge: 2021-11-16 | Disposition: A | Discharge status: Home / Self Care | Payer: Medicaid Other | Attending: Emergency Medicine | Admitting: Emergency Medicine

## 2021-11-16 ENCOUNTER — Emergency Department: Payer: Medicaid Other

## 2021-11-16 ENCOUNTER — Other Ambulatory Visit: Payer: Self-pay

## 2021-11-16 DIAGNOSIS — M5442 Lumbago with sciatica, left side: Secondary | ICD-10-CM | POA: Insufficient documentation

## 2021-11-16 DIAGNOSIS — M5432 Sciatica, left side: Secondary | ICD-10-CM

## 2021-11-16 DIAGNOSIS — U071 COVID-19: Secondary | ICD-10-CM

## 2021-11-16 DIAGNOSIS — E039 Hypothyroidism, unspecified: Secondary | ICD-10-CM | POA: Insufficient documentation

## 2021-11-16 LAB — GROUP A STREP BY PCR: Group A Strep by PCR: NOT DETECTED

## 2021-11-16 LAB — SARS CORONAVIRUS 2 BY RT PCR: SARS Coronavirus 2 by RT PCR: POSITIVE — AB

## 2021-11-16 MED ORDER — METHYLPREDNISOLONE 4 MG PO TBPK: ORAL_TABLET | ORAL | 0 refills | Status: DC

## 2021-11-16 MED ORDER — MAGIC MOUTHWASH
10.0000 mL | Freq: Once | ORAL | Status: DC
  Filled 2021-11-16: qty 10

## 2021-11-16 MED ORDER — MAGIC MOUTHWASH: ORAL | 0 refills | Status: DC

## 2021-11-16 MED ORDER — HYDROCOD POLI-CHLORPHE POLI ER 10-8 MG/5ML PO SUER: 5.0000 mL | Freq: Two times a day (BID) | ORAL | 0 refills | Status: DC | PRN

## 2021-11-16 MED ORDER — HYDROCOD POLI-CHLORPHE POLI ER 10-8 MG/5ML PO SUER: 5.0000 mL | Freq: Once | ORAL | Status: DC

## 2021-11-16 MED ORDER — KETOROLAC TROMETHAMINE 60 MG/2ML IM SOLN: 30.0000 mg | Freq: Once | INTRAMUSCULAR | Status: DC

## 2021-11-16 NOTE — ED Triage Notes (Signed)
Patient reports all over body aches especially lower back and hip area.  Also reports having sore throat.
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
## 2021-11-16 NOTE — Discharge Instructions (Signed)
Take steroid taper as prescribed. You may take medicines as needed for cough and sore throat. Return to the ER for worsening symptoms or other concerns.

## 2022-10-07 ENCOUNTER — Observation Stay
Admission: EM | Admit: 2022-10-07 | Discharge: 2022-10-08 | Disposition: A | Discharge status: Home / Self Care | Payer: Medicare Other | Attending: Internal Medicine | Admitting: Internal Medicine

## 2022-10-07 ENCOUNTER — Emergency Department: Payer: Medicare Other

## 2022-10-07 ENCOUNTER — Encounter: Payer: Self-pay | Admitting: Emergency Medicine

## 2022-10-07 ENCOUNTER — Other Ambulatory Visit: Payer: Self-pay

## 2022-10-07 DIAGNOSIS — E039 Hypothyroidism, unspecified: Secondary | ICD-10-CM | POA: Diagnosis not present

## 2022-10-07 DIAGNOSIS — Z79899 Other long term (current) drug therapy: Secondary | ICD-10-CM | POA: Diagnosis not present

## 2022-10-07 DIAGNOSIS — Z87891 Personal history of nicotine dependence: Secondary | ICD-10-CM | POA: Diagnosis not present

## 2022-10-07 DIAGNOSIS — R55 Syncope and collapse: Secondary | ICD-10-CM | POA: Diagnosis not present

## 2022-10-07 DIAGNOSIS — Z1152 Encounter for screening for COVID-19: Secondary | ICD-10-CM | POA: Insufficient documentation

## 2022-10-07 DIAGNOSIS — J4520 Mild intermittent asthma, uncomplicated: Secondary | ICD-10-CM | POA: Diagnosis not present

## 2022-10-07 DIAGNOSIS — R001 Bradycardia, unspecified: Secondary | ICD-10-CM | POA: Diagnosis present

## 2022-10-07 LAB — URINE DRUG SCREEN, QUALITATIVE (ARMC ONLY)
Amphetamines, Ur Screen: NOT DETECTED
Barbiturates, Ur Screen: NOT DETECTED
Benzodiazepine, Ur Scrn: NOT DETECTED
Cannabinoid 50 Ng, Ur ~~LOC~~: NOT DETECTED
Cocaine Metabolite,Ur ~~LOC~~: NOT DETECTED
MDMA (Ecstasy)Ur Screen: NOT DETECTED
Methadone Scn, Ur: NOT DETECTED
Opiate, Ur Screen: NOT DETECTED
Phencyclidine (PCP) Ur S: NOT DETECTED
Tricyclic, Ur Screen: NOT DETECTED

## 2022-10-07 LAB — URINALYSIS, ROUTINE W REFLEX MICROSCOPIC
Bilirubin Urine: NEGATIVE
Glucose, UA: NEGATIVE mg/dL
Hgb urine dipstick: NEGATIVE
Ketones, ur: NEGATIVE mg/dL
Leukocytes,Ua: NEGATIVE
Nitrite: NEGATIVE
Protein, ur: NEGATIVE mg/dL
Specific Gravity, Urine: 1.015 (ref 1.005–1.030)
pH: 6 (ref 5.0–8.0)

## 2022-10-07 LAB — CBC
HCT: 45.8 % (ref 36.0–46.0)
Hemoglobin: 15.3 g/dL — ABNORMAL HIGH (ref 12.0–15.0)
MCH: 30.6 pg (ref 26.0–34.0)
MCHC: 33.4 g/dL (ref 30.0–36.0)
MCV: 91.6 fL (ref 80.0–100.0)
Platelets: 188 10*3/uL (ref 150–400)
RBC: 5 MIL/uL (ref 3.87–5.11)
RDW: 11.8 % (ref 11.5–15.5)
WBC: 4.1 10*3/uL (ref 4.0–10.5)
nRBC: 0 % (ref 0.0–0.2)

## 2022-10-07 LAB — BASIC METABOLIC PANEL
Anion gap: 7 (ref 5–15)
BUN: 13 mg/dL (ref 6–20)
CO2: 28 mmol/L (ref 22–32)
Calcium: 9.2 mg/dL (ref 8.9–10.3)
Chloride: 106 mmol/L (ref 98–111)
Creatinine, Ser: 0.67 mg/dL (ref 0.44–1.00)
GFR, Estimated: >60 mL/min (ref >60)
Glucose, Bld: 108 mg/dL — ABNORMAL HIGH (ref 70–99)
Potassium: 5 mmol/L (ref 3.5–5.1)
Sodium: 141 mmol/L (ref 135–145)

## 2022-10-07 LAB — TROPONIN I (HIGH SENSITIVITY): Troponin I (High Sensitivity): 12 ng/L (ref <18)

## 2022-10-07 LAB — POC URINE PREG, ED: Preg Test, Ur: NEGATIVE

## 2022-10-07 LAB — SARS CORONAVIRUS 2 BY RT PCR: SARS Coronavirus 2 by RT PCR: NEGATIVE

## 2022-10-07 LAB — TSH: TSH: 2.1 u[IU]/mL (ref 0.350–4.500)

## 2022-10-07 MED ORDER — ENOXAPARIN SODIUM 40 MG/0.4ML IJ SOSY
40.0000 mg | PREFILLED_SYRINGE | INTRAMUSCULAR | Status: DC
  Administered 2022-10-07: 40 mg via SUBCUTANEOUS
  Filled 2022-10-07: qty 0.4

## 2022-10-07 MED ORDER — DULOXETINE HCL 60 MG PO CPEP: 60.0000 mg | ORAL_CAPSULE | Freq: Two times a day (BID) | ORAL | Status: DC

## 2022-10-07 MED ORDER — LEVOTHYROXINE SODIUM 25 MCG PO TABS: 25.0000 ug | ORAL_TABLET | Freq: Every day | ORAL | Status: DC

## 2022-10-07 MED ORDER — MELATONIN 5 MG PO TABS: 10.0000 mg | ORAL_TABLET | Freq: Every evening | ORAL | Status: DC | PRN

## 2022-10-07 MED ORDER — HYDROCOD POLI-CHLORPHE POLI ER 10-8 MG/5ML PO SUER: 5.0000 mL | Freq: Two times a day (BID) | ORAL | Status: DC | PRN

## 2022-10-07 MED ORDER — ROPINIROLE HCL 1 MG PO TABS: 1.0000 mg | ORAL_TABLET | Freq: Every day | ORAL | Status: DC

## 2022-10-07 MED ORDER — BUSPIRONE HCL 5 MG PO TABS: 10.0000 mg | ORAL_TABLET | Freq: Three times a day (TID) | ORAL | Status: DC

## 2022-10-07 MED ORDER — SODIUM CHLORIDE 0.9 % IV BOLUS
1000.0000 mL | Freq: Once | INTRAVENOUS | Status: AC
  Administered 2022-10-07: 1000 mL via INTRAVENOUS

## 2022-10-07 MED ORDER — ONDANSETRON HCL 4 MG/2ML IJ SOLN
4.0000 mg | Freq: Once | INTRAMUSCULAR | Status: AC
  Administered 2022-10-07: 4 mg via INTRAVENOUS
  Filled 2022-10-07: qty 2

## 2022-10-07 MED ORDER — ALBUTEROL SULFATE (2.5 MG/3ML) 0.083% IN NEBU: 3.0000 mL | INHALATION_SOLUTION | Freq: Four times a day (QID) | RESPIRATORY_TRACT | Status: DC | PRN

## 2022-10-07 MED ORDER — SODIUM CHLORIDE 0.9% FLUSH
3.0000 mL | Freq: Two times a day (BID) | INTRAVENOUS | Status: DC
  Administered 2022-10-07 – 2022-10-08 (×2): 3 mL via INTRAVENOUS

## 2022-10-07 MED ORDER — PANTOPRAZOLE SODIUM 40 MG PO TBEC: 40.0000 mg | DELAYED_RELEASE_TABLET | Freq: Every evening | ORAL | Status: DC

## 2022-10-07 MED ORDER — ACETAMINOPHEN 325 MG PO TABS
650.0000 mg | ORAL_TABLET | Freq: Four times a day (QID) | ORAL | Status: DC | PRN
  Administered 2022-10-07: 650 mg via ORAL
  Filled 2022-10-07: qty 2

## 2022-10-07 MED ORDER — KETOROLAC TROMETHAMINE 30 MG/ML IJ SOLN
15.0000 mg | Freq: Once | INTRAMUSCULAR | Status: AC
  Administered 2022-10-07: 15 mg via INTRAVENOUS
  Filled 2022-10-07: qty 1

## 2022-10-07 NOTE — ED Notes (Signed)
Pt reports HA. This RN messaged admitting provider for medication.
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
## 2022-10-07 NOTE — ED Notes (Signed)
Paduchowski, MD, made aware of sinus bradycardia in mid 30s

## 2022-10-07 NOTE — Progress Notes (Addendum)
Ordered overnight sp02 study START at this time. Pt on RA. Sp02 96%, HR 48  10/08/22 0546 - Study completed, file downloaded/saved, report printed and to be placed in patients chart.

## 2022-10-07 NOTE — Discharge Instructions (Signed)
Please drink plenty of fluids and obtain plenty of rest.  Please follow-up with your doctor for recheck/reevaluation.  Return to the emergency department for any episodes of lightheadedness dizziness or any other symptom personally concerning to yourself.

## 2022-10-07 NOTE — ED Notes (Signed)
Pt ambulatory to restroom and back to bed.
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
## 2022-10-07 NOTE — Progress Notes (Deleted)
Attending Note Patient seen and examined, agree with detailed note above,   Patient presentation and plan discussed on rounds.   Cardiology consult placed by Dr.  Lenard Lance For consult: Syncope, bradycardia  EKG lab work, chest x-ray, echocardiogram reviewed independently by myself  Ms. Lindsey Davenport is a 42 year old woman with history of hypothyroidism (now off medicines, normalized), smoker 30 years, history of atypical chest pain, bradycardia, prior drug abuse clean for 6 months, presenting after episode of syncope noted to have sinus bradycardia  Previously evaluated by cardiology in 2017 and in 2022, sinus bradycardia and abnormal EKG Echocardiogram and stress test was ordered but not completed,  no follow-up in clinic since that time  Reports that she had episode of funny vision lasting 3 minutes yesterday while she was in line at [employer].  Was able to put her car in park until symptoms resolved, no further episodes. This morning got up in her usual state of health, was showering at 9 AM when legs felt very weak, tried to steady herself, next thing she remembers she was half in and out of the shower, head scratched left side of her cheek, family member found her after she had syncopal episode  Reports she is not on any medications, no herbs, no over-the-counter, no recent drug use Reports some muscular discomfort upper part of her posterior neck  In the ER EKG and telemetry showing heart rates into the 30s -50s Review of her Apple Watch data shows rates 30-50 today, yesterday heart rates predominantly greater than 50  On examination : alert oriented, no JVD, lungs clear to auscultation bilaterally, heart sounds bradycardic, regular S1-S2 no murmurs appreciated, abdomen soft nontender no significant lower extremity edema.  Musculoskeletal exam with good range of motion, neurologic exam grossly nonfocal     Latest Ref Rng & Units 10/07/2022   10:11 AM 09/07/2021    11:06 AM 03/29/2021    6:02 PM  BMP  Glucose 70 - 99 mg/dL 191  478  93   BUN 6 - 20 mg/dL 13  13  8    Creatinine 0.44 - 1.00 mg/dL 2.95  6.21  3.08   Sodium 135 - 145 mmol/L 141  141  143   Potassium 3.5 - 5.1 mmol/L 5.0  3.8  4.0   Chloride 98 - 111 mmol/L 106  112  107   CO2 22 - 32 mmol/L 28  24  26    Calcium 8.9 - 10.3 mg/dL 9.2  9.2  9.2       Latest Ref Rng & Units 10/07/2022   10:11 AM 09/07/2021   11:06 AM 03/29/2021    6:02 PM  CBC  WBC 4.0 - 10.5 K/uL 4.1  4.8  5.9   Hemoglobin 12.0 - 15.0 g/dL 65.7  84.6  96.2   Hematocrit 36.0 - 46.0 % 45.8  42.0  41.9   Platelets 150 - 400 K/uL 188  192  233    Normal TSH  EKG personally reviewed by myself on todays visit Sinus bradycardia rate 44 bpm no significant ST-T wave changes  A/P: Syncope 9 AM this morning, syncope in the shower, witnessed, initially felt weak in the knees before going down Incidental finding of bradycardia noted in the emergency room TSH normal Echocardiogram pending, Telemetry monitoring overnight For persistent severe bradycardia, would involve EP  Sinus bradycardia Wears a Apple Watch device that has been monitoring heart rate Typically rate is 50 or higher Today rate into the 30s -50 through  much of the day Unclear etiology UDS negative Not on any AV nodal blocking agents Blood pressure low but stable, no indication for temporary pacing or pressors Consider Zio monitor at discharge if rate improves  Hypothyroidism Previously on medications, reports recently has been off medication as she was told TSH was normal Lab work done through the health department TSH here within normal range 2.1  Smoker Cessation recommended   Greater than 50% was spent in counseling and coordination of care with patient Total encounter time 80 minutes or more   Signed: Dossie Arbour  M.D., Ph.D. [facility]

## 2022-10-07 NOTE — ED Notes (Signed)
Patient transported to CT 
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
## 2022-10-08 ENCOUNTER — Ambulatory Visit: Payer: Medicare Other | Attending: Cardiology

## 2022-10-08 ENCOUNTER — Observation Stay (HOSPITAL_BASED_OUTPATIENT_CLINIC_OR_DEPARTMENT_OTHER)
Admit: 2022-10-08 | Discharge: 2022-10-08 | Disposition: A | Discharge status: Home / Self Care | Payer: Medicare Other | Attending: Cardiology | Admitting: Cardiology

## 2022-10-08 DIAGNOSIS — R55 Syncope and collapse: Secondary | ICD-10-CM

## 2022-10-08 DIAGNOSIS — R001 Bradycardia, unspecified: Secondary | ICD-10-CM | POA: Diagnosis not present

## 2022-10-08 LAB — MAGNESIUM: Magnesium: 2.3 mg/dL (ref 1.7–2.4)

## 2022-10-08 LAB — ECHOCARDIOGRAM COMPLETE
AR max vel: 2.11 cm2
AV Area VTI: 2 cm2
AV Area mean vel: 2.05 cm2
AV Mean grad: 4 mmHg
AV Peak grad: 9.2 mmHg
Ao pk vel: 1.52 m/s
Area-P 1/2: 2.94 cm2
Height: 63 in
MV VTI: 1.68 cm2
S' Lateral: 3.7 cm
Weight: 2160 [oz_av]

## 2022-10-08 LAB — PHOSPHORUS: Phosphorus: 3.3 mg/dL (ref 2.5–4.6)

## 2022-10-08 LAB — CBG MONITORING, ED: Glucose-Capillary: 91 mg/dL (ref 70–99)

## 2022-10-08 LAB — HIV ANTIBODY (ROUTINE TESTING W REFLEX): HIV Screen 4th Generation wRfx: NONREACTIVE

## 2022-10-08 MED ORDER — ALBUTEROL SULFATE HFA 108 (90 BASE) MCG/ACT IN AERS: 1.0000 | INHALATION_SPRAY | RESPIRATORY_TRACT | 0 refills | Status: DC | PRN

## 2022-10-08 NOTE — TOC Initial Note (Signed)
Transition of Care [facility]) - Initial/Assessment Note    Patient Details  Name: Lindsey Davenport MRN: [id number] Date of Birth: 1980/08/17  [facility] CM/SW Contact:    Marquita Palms, LCSW Phone Number: 10/08/2022, 9:55 AM  Clinical Narrative:                  CSW met with patient at her bedside. She reports that she lives home alone and she drove herself to the ER. She reports [facility] [place] and use [facility] in [place], when she needs meds.  CSW offered resources for SA. She reports that she is currently working through her AA.  Expected Discharge Plan: Home/Self Care Barriers to Discharge: No Barriers Identified   Patient Goals and CMS Choice            Expected Discharge Plan and Services       Living arrangements for the past 2 months: Single Family Home                                      Prior Living Arrangements/Services Living arrangements for the past 2 months: Single Family Home Lives with:: Self   Do you feel safe going back to the place where you live?: Yes               Activities of Daily Living      Permission Sought/Granted                  Emotional Assessment Appearance:: Disheveled Attitude/Demeanor/Rapport: Gracious Affect (typically observed): Calm Orientation: : Oriented to Self, Oriented to Place, Oriented to  Time, Oriented to Situation      Admission diagnosis:  Syncope [R55] Patient Active Problem List   Diagnosis Date Noted   Syncope 10/07/2022   RLS (restless legs syndrome) 10/02/2020   Prediabetes 10/02/2020   Insomnia 10/02/2020   Overweight with body mass index (BMI) of 28 to 28.9 in adult 10/02/2020   Fibromyalgia 09/23/2020   Mixed hyperlipidemia 02/14/2020   Gastroesophageal reflux disease without esophagitis 02/14/2020   Generalized anxiety disorder 08/10/2018   History of drug abuse (HCC) 11/18/2017   Bradycardia 11/07/2015   Hypothyroidism     Bipolar I disorder, most recent episode depressed (HCC) 05/20/2012   PCP:  System, Provider Not In Pharmacy:   [facility] - [place], [place] - [address], [address] [place] [address] Phone: [phone] Fax: [phone]  [id number] - [place], [place] - [address] [place] [address] Phone: [phone] Fax: [phone]     Social Determinants of Health (SDOH) Social History: SDOH Screenings   Food Insecurity: Food Insecurity Present (04/12/2017)  Transportation Needs: No Transportation Needs (04/12/2017)  Depression (PHQ2-9): Medium Risk (10/02/2020)  Financial Resource Strain: Medium Risk (04/12/2017)  Physical Activity: Inactive (04/12/2017)  Social Connections: Moderately Isolated (04/12/2017)  Stress: Stress Concern Present (04/12/2017)  Tobacco Use: Medium Risk (10/07/2022)   SDOH Interventions:     Readmission Risk Interventions     No data to display

## 2022-10-08 NOTE — Progress Notes (Signed)
Rounding Note    Patient Name: Lindsey Davenport Date of Encounter: 10/08/2022  [facility] Cardiologist: Debbe Odea, MD   Subjective   Patient is overall doing well. She denies chest pain or shortness of breath. HR 40-50s. She reports she is afraid to stand up and fall due to dizziness. No high grade heart block noted. Echo is still pending.   Inpatient Medications    Scheduled Meds:  enoxaparin (LOVENOX) injection  40 mg Subcutaneous Q24H   sodium chloride flush  3 mL Intravenous Q12H   Continuous Infusions:  PRN Meds: acetaminophen, albuterol, melatonin   Vital Signs    Vitals:   10/08/22 0135 10/08/22 0400 10/08/22 0600 10/08/22 0615  BP: (!) 97/54 93/61 96/60    Pulse: (!) 47 (!) 40 63   Resp: 20 13 16    Temp:    98.2 F (36.8 C)  TempSrc:    Oral  SpO2: 99% 98% 100%   Weight:      Height:       No intake or output data in the 24 hours ending 10/08/22 0755    10/07/2022    9:56 AM 11/16/2021    2:29 AM 09/07/2021   11:03 AM  Last 3 Weights  Weight (lbs) 135 lb 150 lb 150 lb  Weight (kg) 61.236 kg 68.04 kg 68.04 kg      Telemetry    SB HR 40-50s - Personally Reviewed  ECG    No new - Personally Reviewed  Physical Exam   GEN: No acute distress.   Neck: No JVD Cardiac: bradycardia, RR, no murmurs, rubs, or gallops.  Respiratory: Clear to auscultation bilaterally. GI: Soft, nontender, non-distended  MS: No edema; No deformity. Neuro:  Nonfocal  Psych: Normal affect   Labs    High Sensitivity Troponin:   Recent Labs  Lab 10/07/22 1011  TROPONINIHS 12     Chemistry Recent Labs  Lab 10/07/22 1011 10/08/22 0614  NA 141  --   K 5.0  --   CL 106  --   CO2 28  --   GLUCOSE 108*  --   BUN 13  --   CREATININE 0.67  --   CALCIUM 9.2  --   MG  --  2.3  GFRNONAA >60  --   ANIONGAP 7  --     Lipids No results for input(s): "CHOL", "TRIG", "HDL", "LABVLDL", "LDLCALC", "CHOLHDL" in the last 168 hours.  Hematology Recent  Labs  Lab 10/07/22 1011  WBC 4.1  RBC 5.00  HGB 15.3*  HCT 45.8  MCV 91.6  MCH 30.6  MCHC 33.4  RDW 11.8  PLT 188   Thyroid  Recent Labs  Lab 10/07/22 1144  TSH 2.100    BNPNo results for input(s): "BNP", "PROBNP" in the last 168 hours.  DDimer No results for input(s): "DDIMER" in the last 168 hours.   Radiology    CT HEAD WO CONTRAST ( )  Result Date: 10/07/2022 CLINICAL DATA:  Provided history: Head trauma, minor, normal mental status. Syncopal episode. Facial abrasions. EXAM: CT HEAD WITHOUT CONTRAST TECHNIQUE: Contiguous axial images were obtained from the base of the skull through the vertex without intravenous contrast. RADIATION DOSE REDUCTION: This exam was performed according to the departmental dose-optimization program which includes automated exposure control, adjustment of the mA and/or kV according to patient size and/or use of iterative reconstruction technique. COMPARISON:  Head CT 11/08/2017.  Brain MRI 02/26/2014. FINDINGS: Brain: Cerebral volume is normal. There is no  acute intracranial hemorrhage. No demarcated cortical infarct. No extra-axial fluid collection. No evidence of an intracranial mass. No midline shift. Vascular: No hyperdense vessel. Skull: No calvarial fracture or aggressive osseous lesion. Sinuses/Orbits: The orbits are only minimally included in the field of view. No significant paranasal sinus disease at the imaged levels. IMPRESSION: No evidence of an acute intracranial abnormality. Electronically Signed   By: Jackey Loge D.O.   On: 10/07/2022 13:42    Cardiac Studies   Myoview lexi 2017  The left ventricular ejection fraction is mildly decreased (45-54%). Nuclear stress EF: 51%. There was no ST segment deviation noted during stress. This is a low risk study.   Normal resting and stress perfusion. No ischemia or infarction EF 51% with possible apical hypokinesis Baseline ECG with ? Anteroapical Infarct and anterolateral Twave inversions  that pseudonormalized with exercise  Patient Profile     42 y.o. female with a hx of Hypothyroidism, current smoker for 30+ years, history of chest pain, and bradycardia, previous history of drug abuse, who is being seen 10/07/2022 for the evaluation of syncope with bradycardia   Assessment & Plan    Syncope Symptomatic bradycardia - patient presented after falling in the shower.  - reports HR in the 40s at home - CT head negative - TSH 2.1 - UDS negative - Echo ordered - patient is not on AV nodal blocking agents - Tele shows SB with HR in the 40-50s and even down to the 30s at times. No high grade heart block - will need heart monitor at d/c - may ultimately need to see EP  Hypothyroidism - TSH 2.1 - per IM  Tobacco use - cessation required  For questions or updates, please contact [facility] Please consult [email or web address] for contact info under        Signed, Hiba Garry David Stall, PA-C  10/08/2022, 7:55 AM

## 2022-10-08 NOTE — Discharge Summary (Signed)
Physician Discharge Summary  Lindsey Davenport:[id number] DOB: 03/24/1980 DOA: 10/07/2022  PCP: System, Provider Not In  Admit date: 10/07/2022 Discharge date: 10/08/2022  Admitted From: Home Disposition:  Home  Recommendations for Outpatient Follow-up:  Follow up with PCP in 1-2 weeks Follow up with cardiology/EP 2-4 weeks  Home Health:No  Equipment/Devices:None   Discharge Condition:Stable  CODE STATUS:FULL  Diet recommendation: Reg  Brief/Interim Summary:  42 y.o. female with medical history significant of sinus bradycardia, mild intermittent asthma, bipolar disorder, GERD, hypothyroidism presented with syncope and fall.   Patient has history of sinus bradycardia used to follow with cardiology 2 years ago, later patient lost follow-up, " I felt better" and her heart rate appears to stabilized in the 40-50 range.  She does wear an [employer], and it shows that in the daytime her heart rate in the 40-50s and at night occasionally her heart rate dropped to mid 30s like 35/37 but she was asymptomatic she was never diagnosed with OSA.  This week however she started to notice frequent episode of bradycardia even at the time.  But on to today, she does not feel any lightheadedness or blurry vision.  This morning, while in shower, without any warning signs, she lost her consciousness and fell.  She woke up and found her face and right wrist has scratches and bleeding but she denies any pain no headache no chest pains.  9/6: Seen in consultation by cardiology and electrophysiology.  Unclear nature for bradycardia and syncopal event.  Possible vasovagal.  Telemetry reviewed, sinus bradycardia.  Echocardiogram reviewed.  Normal ejection fraction, no diastolic dysfunction, no valvulopathy.  Zio patch placed on time of discharge.  Stable for DC home at this time.  Follow-up outpatient cardiology and EP 2 to 4 weeks.   Discharge Diagnoses:  Principal Problem:   Syncope Active Problems:    Bradycardia  Bradycardia Syncopal event Unclear etiology.  Possible vasovagal.  No arrhythmia beyond sinus bradycardia.  Echocardiogram normal.  Zio patch placed.  Stable for discharge.  Follow-up outpatient EP and cardiology 2 to 4 weeks.  Discharge Instructions  Discharge Instructions     Diet - low sodium heart healthy   Complete by: As directed    Increase activity slowly   Complete by: As directed       Allergies as of 10/08/2022       Reactions   Hydroxyzine    Wellbutrin [bupropion] Other (See Comments)   Serotonin   Lamictal [lamotrigine] Rash        Medication List     STOP taking these medications    ascorbic acid 500 MG tablet Commonly known as: VITAMIN C   busPIRone 10 MG tablet Commonly known as: BUSPAR   Calcium-Vitamin D3 600-10 MG-MCG Caps   chlorpheniramine-HYDROcodone 10-8 MG/5ML Commonly known as: TUSSIONEX   cyclobenzaprine 10 MG tablet Commonly known as: FLEXERIL   DULoxetine 60 MG capsule Commonly known as: CYMBALTA   levothyroxine 25 MCG tablet Commonly known as: SYNTHROID   magic mouthwash Soln   methylPREDNISolone 4 MG Tbpk tablet Commonly known as: MEDROL DOSEPAK   multivitamin tablet   pantoprazole 40 MG tablet Commonly known as: PROTONIX   rOPINIRole 1 MG tablet Commonly known as: REQUIP   vitamin B-12 100 MCG tablet Commonly known as: CYANOCOBALAMIN   zinc gluconate 50 MG tablet   ZINC PO       TAKE these medications    albuterol 108 (90 Base) MCG/ACT inhaler Commonly known as: VENTOLIN HFA  Inhale 1 puff into the lungs every 4 (four) hours as needed for wheezing or shortness of breath. What changed: See the new instructions.   Melatonin 10 MG Tabs Take 1 tablet by mouth at bedtime as needed (sleep).   terbinafine 250 MG tablet Commonly known as: LAMISIL Take 250 mg by mouth daily.        Allergies  Allergen Reactions   Hydroxyzine    Wellbutrin [Bupropion] Other (See Comments)    Serotonin    Lamictal [Lamotrigine] Rash    Consultations: Cardiology EP   Procedures/Studies: ECHOCARDIOGRAM COMPLETE  Result Date: 10/08/2022    ECHOCARDIOGRAM REPORT   Patient Name:   Lindsey Davenport Date of Exam: 10/08/2022 Medical Rec #:  [id number]            Height:       63.0 in Accession #:    [id number]           Weight:       135.0 lb Date of Birth:  06/05/1980            BSA:          1.636 m Patient Age:    42 years             BP:           96/60 mmHg Patient Gender: F                    HR:           37 bpm. Exam Location:  [facility] Procedure: 2D Echo, Cardiac Doppler and Color Doppler Indications:     Syncope  History:         Patient has prior history of Echocardiogram examinations, most                  recent 07/04/2009. Arrythmias:Bradycardia,                  Signs/Symptoms:Syncope; Risk Factors:Dyslipidemia.                  Polysubstance abuse.  Sonographer:     Mikki Harbor Referring Phys:  [id number] SHERI HAMMOCK Diagnosing Phys: Debbe Odea MD IMPRESSIONS  1. Left ventricular ejection fraction, by estimation, is 55 to 60%. Left ventricular ejection fraction by PLAX is 55 %. The left ventricle has normal function. The left ventricle has no regional wall motion abnormalities. Left ventricular diastolic parameters were normal.  2. Right ventricular systolic function is normal. The right ventricular size is normal. There is normal pulmonary artery systolic pressure.  3. The mitral valve is normal in structure. Mild mitral valve regurgitation.  4. The aortic valve is tricuspid. Aortic valve regurgitation is not visualized.  5. The inferior vena cava is normal in size with greater than 50% respiratory variability, suggesting right atrial pressure of 3 mmHg. FINDINGS  Left Ventricle: Left ventricular ejection fraction, by estimation, is 55 to 60%. Left ventricular ejection fraction by PLAX is 55 %. The left ventricle has normal function. The left ventricle has no regional wall motion  abnormalities. The left ventricular internal cavity size was normal in size. There is no left ventricular hypertrophy. Left ventricular diastolic parameters were normal. Right Ventricle: The right ventricular size is normal. No increase in right ventricular wall thickness. Right ventricular systolic function is normal. There is normal pulmonary artery systolic pressure. The tricuspid regurgitant velocity is 2.18 m/s, and  with an assumed right atrial pressure of 3 mmHg,  the estimated right ventricular systolic pressure is 22.0 mmHg. Left Atrium: Left atrial size was normal in size. Right Atrium: Right atrial size was normal in size. Pericardium: There is no evidence of pericardial effusion. Mitral Valve: The mitral valve is normal in structure. Mild mitral valve regurgitation. MV peak gradient, 4.0 mmHg. The mean mitral valve gradient is 1.0 mmHg. Tricuspid Valve: The tricuspid valve is normal in structure. Tricuspid valve regurgitation is mild. Aortic Valve: The aortic valve is tricuspid. Aortic valve regurgitation is not visualized. Aortic valve mean gradient measures 4.0 mmHg. Aortic valve peak gradient measures 9.2 mmHg. Aortic valve area, by VTI measures 2.00 cm. Pulmonic Valve: The pulmonic valve was normal in structure. Pulmonic valve regurgitation is not visualized. Aorta: The aortic root is normal in size and structure. Venous: The inferior vena cava is normal in size with greater than 50% respiratory variability, suggesting right atrial pressure of 3 mmHg. IAS/Shunts: No atrial level shunt detected by color flow Doppler.  LEFT VENTRICLE PLAX 2D LV EF:         Left            Diastology                ventricular     LV e' medial:    10.00 cm/s                ejection        LV E/e' medial:  8.8                fraction by     LV e' lateral:   16.10 cm/s                PLAX is 55      LV E/e' lateral: 5.5                %. LVIDd:         5.20 cm LVIDs:         3.70 cm LV PW:         0.70 cm LV IVS:         0.80 cm LVOT diam:     1.90 cm LV SV:         78 LV SV Index:   48 LVOT Area:     2.84 cm  RIGHT VENTRICLE RV Basal diam:  3.60 cm RV Mid diam:    3.00 cm RV S prime:     11.60 cm/s LEFT ATRIUM             Index        RIGHT ATRIUM           Index LA diam:        3.70 cm 2.26 cm/m   RA Area:     15.20 cm LA Vol (A2C):   39.0 ml 23.84 ml/m  RA Volume:   40.30 ml  24.63 ml/m LA Vol (A4C):   35.1 ml 21.45 ml/m LA Biplane Vol: 39.2 ml 23.96 ml/m  AORTIC VALVE                    PULMONIC VALVE AV Area (Vmax):    2.11 cm     PV Vmax:       1.05 m/s AV Area (Vmean):   2.05 cm     PV Peak grad:  4.4 mmHg AV Area (VTI):     2.00 cm AV Vmax:  152.00 cm/s AV Vmean:          93.100 cm/s AV VTI:            0.391 m AV Peak Grad:      9.2 mmHg AV Mean Grad:      4.0 mmHg LVOT Vmax:         113.00 cm/s LVOT Vmean:        67.400 cm/s LVOT VTI:          0.276 m LVOT/AV VTI ratio: 0.71  AORTA Ao Root diam: 3.00 cm MITRAL VALVE               TRICUSPID VALVE MV Area (PHT): 2.94 cm    TR Peak grad:   19.0 mmHg MV Area VTI:   1.68 cm    TR Vmax:        218.00 cm/s MV Peak grad:  4.0 mmHg MV Mean grad:  1.0 mmHg    SHUNTS MV Vmax:       1.00 m/s    Systemic VTI:  0.28 m MV Vmean:      38.8 cm/s   Systemic Diam: 1.90 cm MV Decel Time: 258 msec MV E velocity: 88.00 cm/s MV A velocity: 72.30 cm/s MV E/A ratio:  1.22 Debbe Odea MD Electronically signed by Debbe Odea MD Signature Date/Time: 10/08/2022/2:10:59 PM    Final    CT HEAD WO CONTRAST ( )  Result Date: 10/07/2022 CLINICAL DATA:  Provided history: Head trauma, minor, normal mental status. Syncopal episode. Facial abrasions. EXAM: CT HEAD WITHOUT CONTRAST TECHNIQUE: Contiguous axial images were obtained from the base of the skull through the vertex without intravenous contrast. RADIATION DOSE REDUCTION: This exam was performed according to the departmental dose-optimization program which includes automated exposure control, adjustment of the mA  and/or kV according to patient size and/or use of iterative reconstruction technique. COMPARISON:  Head CT 11/08/2017.  Brain MRI 02/26/2014. FINDINGS: Brain: Cerebral volume is normal. There is no acute intracranial hemorrhage. No demarcated cortical infarct. No extra-axial fluid collection. No evidence of an intracranial mass. No midline shift. Vascular: No hyperdense vessel. Skull: No calvarial fracture or aggressive osseous lesion. Sinuses/Orbits: The orbits are only minimally included in the field of view. No significant paranasal sinus disease at the imaged levels. IMPRESSION: No evidence of an acute intracranial abnormality. Electronically Signed   By: Jackey Loge D.O.   On: 10/07/2022 13:42      Subjective: Seen and examined on the day of discharge.  Stable no distress.  Appropriate for discharge home.  Discharge Exam: Vitals:   10/08/22 0615 10/08/22 1000  BP:  107/67  Pulse:  (!) 47  Resp:  15  Temp: 98.2 F (36.8 C)   SpO2:  98%   Vitals:   10/08/22 0400 10/08/22 0600 10/08/22 0615 10/08/22 1000  BP: 93/61 96/60  107/67  Pulse: (!) 40 63  (!) 47  Resp: 13 16  15   Temp:   98.2 F (36.8 C)   TempSrc:   Oral   SpO2: 98% 100%  98%  Weight:      Height:        General: Pt is alert, awake, not in acute distress Cardiovascular: RRR, S1/S2 +, no rubs, no gallops Respiratory: CTA bilaterally, no wheezing, no rhonchi Abdominal: Soft, NT, ND, bowel sounds + Extremities: no edema, no cyanosis    The results of significant diagnostics from this hospitalization (including imaging, microbiology, ancillary and laboratory) are listed below for reference.  Microbiology: Recent Results (from the past 240 hour(s))  SARS Coronavirus 2 by RT PCR (hospital order, performed in [facility] hospital lab) *cepheid single result test* Anterior Nasal Swab     Status: None   Collection Time: 10/07/22 11:58 AM   Specimen: Anterior Nasal Swab  Result Value Ref Range Status   SARS  Coronavirus 2 by RT PCR NEGATIVE NEGATIVE Final    Comment: (NOTE) SARS-CoV-2 target nucleic acids are NOT DETECTED.  The SARS-CoV-2 RNA is generally detectable in upper and lower respiratory specimens during the acute phase of infection. The lowest concentration of SARS-CoV-2 viral copies this assay can detect is 250 copies / mL. A negative result does not preclude SARS-CoV-2 infection and should not be used as the sole basis for treatment or other patient management decisions.  A negative result may occur with improper specimen collection / handling, submission of specimen other than nasopharyngeal swab, presence of viral mutation(s) within the areas targeted by this assay, and inadequate number of viral copies (<250 copies / mL). A negative result must be combined with clinical observations, patient history, and epidemiological information.  Fact Sheet for Patients:   RoadLapTop.co.za  Fact Sheet for Healthcare Providers: http://kim-miller.com/  This test is not yet approved or  cleared by the [place] FDA and has been authorized for detection and/or diagnosis of SARS-CoV-2 by FDA under an Emergency Use Authorization (EUA).  This EUA will remain in effect (meaning this test can be used) for the duration of the COVID-19 declaration under Section 564(b)(1) of the Act, 21 U.S.C. section 360bbb-3(b)(1), unless the authorization is terminated or revoked sooner.  Performed at [facility], [address] [place], [place] [address]      Labs: BNP (last 3 results) No results for input(s): "BNP" in the last 8760 hours. Basic Metabolic Panel: Recent Labs  Lab 10/07/22 1011 10/08/22 0614  NA 141  --   K 5.0  --   CL 106  --   CO2 28  --   GLUCOSE 108*  --   BUN 13  --   CREATININE 0.67  --   CALCIUM 9.2  --   MG  --  2.3  PHOS  --  3.3   Liver Function Tests: No results for input(s): "AST", "ALT", "ALKPHOS",  "BILITOT", "PROT", "ALBUMIN" in the last 168 hours. No results for input(s): "LIPASE", "AMYLASE" in the last 168 hours. No results for input(s): "AMMONIA" in the last 168 hours. CBC: Recent Labs  Lab 10/07/22 1011  WBC 4.1  HGB 15.3*  HCT 45.8  MCV 91.6  PLT 188   Cardiac Enzymes: No results for input(s): "CKTOTAL", "CKMB", "CKMBINDEX", "TROPONINI" in the last 168 hours. BNP: Invalid input(s): "POCBNP" CBG: Recent Labs  Lab 10/08/22 0613  GLUCAP 91   D-Dimer No results for input(s): "DDIMER" in the last 72 hours. Hgb A1c No results for input(s): "HGBA1C" in the last 72 hours. Lipid Profile No results for input(s): "CHOL", "HDL", "LDLCALC", "TRIG", "CHOLHDL", "LDLDIRECT" in the last 72 hours. Thyroid function studies Recent Labs    10/07/22 1144  TSH 2.100   Anemia work up No results for input(s): "VITAMINB12", "FOLATE", "FERRITIN", "TIBC", "IRON", "RETICCTPCT" in the last 72 hours. Urinalysis    Component Value Date/Time   COLORURINE YELLOW (A) 10/07/2022 1011   APPEARANCEUR HAZY (A) 10/07/2022 1011   LABSPEC 1.015 10/07/2022 1011   PHURINE 6.0 10/07/2022 1011   GLUCOSEU NEGATIVE 10/07/2022 1011   HGBUR NEGATIVE 10/07/2022 1011   BILIRUBINUR  NEGATIVE 10/07/2022 1011   BILIRUBINUR neg 11/28/2018 1103   KETONESUR NEGATIVE 10/07/2022 1011   PROTEINUR NEGATIVE 10/07/2022 1011   UROBILINOGEN 0.2 11/28/2018 1103   UROBILINOGEN 1.0 07/03/2009 1229   NITRITE NEGATIVE 10/07/2022 1011   LEUKOCYTESUR NEGATIVE 10/07/2022 1011   Sepsis Labs Recent Labs  Lab 10/07/22 1011  WBC 4.1   Microbiology Recent Results (from the past 240 hour(s))  SARS Coronavirus 2 by RT PCR (hospital order, performed in [facility] hospital lab) *cepheid single result test* Anterior Nasal Swab     Status: None   Collection Time: 10/07/22 11:58 AM   Specimen: Anterior Nasal Swab  Result Value Ref Range Status   SARS Coronavirus 2 by RT PCR NEGATIVE NEGATIVE Final    Comment:  (NOTE) SARS-CoV-2 target nucleic acids are NOT DETECTED.  The SARS-CoV-2 RNA is generally detectable in upper and lower respiratory specimens during the acute phase of infection. The lowest concentration of SARS-CoV-2 viral copies this assay can detect is 250 copies / mL. A negative result does not preclude SARS-CoV-2 infection and should not be used as the sole basis for treatment or other patient management decisions.  A negative result may occur with improper specimen collection / handling, submission of specimen other than nasopharyngeal swab, presence of viral mutation(s) within the areas targeted by this assay, and inadequate number of viral copies (<250 copies / mL). A negative result must be combined with clinical observations, patient history, and epidemiological information.  Fact Sheet for Patients:   RoadLapTop.co.za  Fact Sheet for Healthcare Providers: http://kim-miller.com/  This test is not yet approved or  cleared by the [place] FDA and has been authorized for detection and/or diagnosis of SARS-CoV-2 by FDA under an Emergency Use Authorization (EUA).  This EUA will remain in effect (meaning this test can be used) for the duration of the COVID-19 declaration under Section 564(b)(1) of the Act, 21 U.S.C. section 360bbb-3(b)(1), unless the authorization is terminated or revoked sooner.  Performed at [facility], [address], [place], [place] [address]      Time coordinating discharge: Over 30 minutes  SIGNED:   Tresa Moore, MD  [facility] 10/08/2022, 3:28 PM Pager   If 7PM-7AM, please contact night-coverage

## 2022-10-08 NOTE — ED Notes (Signed)
Pt refusing to stay for updated vitals.
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
## 2022-10-08 NOTE — Progress Notes (Signed)
*  PRELIMINARY RESULTS* Echocardiogram 2D Echocardiogram has been performed.  Carolyne Fiscal 10/08/2022, 2:04 PM

## 2022-10-11 NOTE — Group Note (Deleted)

## 2022-10-20 ENCOUNTER — Encounter: Payer: Self-pay | Admitting: Internal Medicine

## 2022-10-20 ENCOUNTER — Ambulatory Visit (INDEPENDENT_AMBULATORY_CARE_PROVIDER_SITE_OTHER): Payer: Medicare Other | Admitting: Internal Medicine

## 2022-10-20 VITALS — BP 108/66 | HR 52 | Temp 95.4°F | Wt 132.0 lb

## 2022-10-20 DIAGNOSIS — R001 Bradycardia, unspecified: Secondary | ICD-10-CM

## 2022-10-20 DIAGNOSIS — F411 Generalized anxiety disorder: Secondary | ICD-10-CM

## 2022-10-20 DIAGNOSIS — M797 Fibromyalgia: Secondary | ICD-10-CM

## 2022-10-20 DIAGNOSIS — R55 Syncope and collapse: Secondary | ICD-10-CM | POA: Diagnosis not present

## 2022-10-20 DIAGNOSIS — E782 Mixed hyperlipidemia: Secondary | ICD-10-CM

## 2022-10-20 DIAGNOSIS — K219 Gastro-esophageal reflux disease without esophagitis: Secondary | ICD-10-CM

## 2022-10-20 DIAGNOSIS — F3130 Bipolar disorder, current episode depressed, mild or moderate severity, unspecified: Secondary | ICD-10-CM

## 2022-10-20 DIAGNOSIS — R7303 Prediabetes: Secondary | ICD-10-CM | POA: Diagnosis not present

## 2022-10-20 DIAGNOSIS — G2581 Restless legs syndrome: Secondary | ICD-10-CM

## 2022-10-20 DIAGNOSIS — E039 Hypothyroidism, unspecified: Secondary | ICD-10-CM

## 2022-10-20 DIAGNOSIS — F1911 Other psychoactive substance abuse, in remission: Secondary | ICD-10-CM

## 2022-10-20 NOTE — Assessment & Plan Note (Signed)
Not medicated We will monitor

## 2022-10-20 NOTE — Assessment & Plan Note (Signed)
TSH and free T4 today

## 2022-10-20 NOTE — Assessment & Plan Note (Signed)
She reports she has been sober for 7 months

## 2022-10-20 NOTE — Assessment & Plan Note (Signed)
C-Met and lipid profile today Encouraged her to consume a low-fat diet

## 2022-10-20 NOTE — Assessment & Plan Note (Signed)
A1c today Encourage low-carb diet and exercise for weight loss

## 2022-10-20 NOTE — Assessment & Plan Note (Signed)
Encouraged regular stretching and muscle toning

## 2022-10-20 NOTE — Assessment & Plan Note (Signed)
Doing well off medications Support offered

## 2022-10-20 NOTE — Patient Instructions (Signed)
Bradycardia, Adult Bradycardia is a slower-than-normal heartbeat. A normal resting heart rate for an adult ranges from 60 to 100 beats per minute. With bradycardia, the resting heart rate is less than 60 beats per minute. Bradycardia can prevent enough oxygen from reaching certain areas of your body when you are active. It can be serious if it keeps enough oxygen from reaching your brain and other parts of your body. Bradycardia is not a problem for everyone. For some healthy adults, a slow resting heart rate is normal. What are the causes? This condition may be caused by: A problem with the heart, including: A problem with the heart's electrical system, such as a heart block. With a heart block, electrical signals between the chambers of the heart are partially or completely blocked, so they are not able to work as they should. A problem with the heart's natural pacemaker (sinus node). Heart disease. A heart attack. Heart damage. Lyme disease. A heart infection. A heart condition that is present at birth (congenital heart defect). Certain medicines that treat heart conditions. Certain conditions, such as hypothyroidism and obstructive sleep apnea. Problems with the balance of chemicals and other substances, like potassium, in the blood. Trauma. Radiation therapy. What increases the risk? You are more likely to develop this condition if you: Are age 72 or older. Have high blood pressure (hypertension), high cholesterol (hyperlipidemia), or diabetes. Drink heavily, use tobacco or nicotine products, or use drugs. What are the signs or symptoms? Symptoms of this condition include: Light-headedness. Feeling faint or fainting. Fatigue and weakness. Trouble with activity or exercise. Shortness of breath. Chest pain (angina). Drowsiness. Confusion. Dizziness. How is this diagnosed? This condition may be diagnosed based on: Your symptoms. Your medical history. A physical exam. During  the exam, your health care provider will listen to your heartbeat and check your pulse. To confirm the diagnosis, your health care provider may order tests, such as: Blood tests. An electrocardiogram (ECG). This test records the heart's electrical activity. The test can show how fast your heart is beating and whether the heartbeat is steady. A test in which you wear a portable device (event recorder or Holter monitor) to record your heart's electrical activity while you go about your day. An exercise test. How is this treated? Treatment for this condition depends on the cause of the condition and how severe your symptoms are. Treatment may involve: Treatment of the underlying condition. Changing your medicines or how much medicine you take. Having a small, battery-operated device called a pacemaker implanted under the skin. When bradycardia occurs, this device can be used to increase your heart rate and help your heart beat in a regular rhythm. Follow these instructions at home: Lifestyle Manage any health conditions that contribute to bradycardia as told by your health care provider. Follow a heart-healthy diet. A nutrition specialist (dietitian) can help educate you about healthy food options and changes. Follow an exercise program that is approved by your health care provider. Maintain a healthy weight. Try to reduce or manage your stress, such as with yoga or meditation. If you need help reducing stress, ask your health care provider. Do not use any products that contain nicotine or tobacco. These products include cigarettes, chewing tobacco, and vaping devices, such as e-cigarettes. If you need help quitting, ask your health care provider. Do not use illegal drugs. Alcohol use If you drink alcohol: Limit how much you have to: 0-1 drink a day for women who are not pregnant. 0-2 drinks a day  for men. Know how much alcohol is in a drink. In the [place] one drink equals one 12 oz bottle of  beer (355 mL), one 5 oz glass of wine (148 mL), or one 1 oz glass of hard liquor (44 mL). General instructions Take over-the-counter and prescription medicines only as told by your health care provider. Keep all follow-up visits. This is important. How is this prevented? In some cases, bradycardia may be prevented by: Treating underlying medical problems. Stopping behaviors or medicines that can trigger the condition. Contact a health care provider if: You feel light-headed or dizzy. You almost faint. You feel weak or are easily fatigued during physical activity. You experience confusion or have memory problems. Get help right away if: You faint. You have chest pains or an irregular heartbeat (palpitations). You have trouble breathing. These symptoms may represent a serious problem that is an emergency. Do not wait to see if the symptoms will go away. Get medical help right away. Call your local emergency services (911 in the [place] Do not drive yourself to the hospital. Summary Bradycardia is a slower-than-normal heartbeat. With bradycardia, the resting heart rate is less than 60 beats per minute. Treatment for this condition depends on the cause. Manage any health conditions that contribute to bradycardia as told by your health care provider. Do not use any products that contain nicotine or tobacco. These products include cigarettes, chewing tobacco, and vaping devices, such as e-cigarettes. Keep all follow-up visits. This is important. This information is not intended to replace advice given to you by your health care provider. Make sure you discuss any questions you have with your health care provider. Document Revised: 05/11/2020 Document Reviewed: 05/11/2020 Elsevier Patient Education  2024 [employer].

## 2022-10-20 NOTE — Assessment & Plan Note (Signed)
Doing well off meds Support offered
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
## 2022-10-20 NOTE — Assessment & Plan Note (Signed)
Avoid foods that trigger reflux Okay to take Tums OTC as needed

## 2022-10-21 LAB — LIPID PANEL
Cholesterol: 201 mg/dL — ABNORMAL HIGH (ref <200)
HDL: 55 mg/dL (ref >=50)
LDL Cholesterol (Calc): 125 mg/dL (calc) — ABNORMAL HIGH
Non-HDL Cholesterol (Calc): 146 mg/dL (calc) — ABNORMAL HIGH (ref <130)
Total CHOL/HDL Ratio: 3.7 (calc) (ref <5.0)
Triglycerides: 103 mg/dL (ref <150)

## 2022-10-21 LAB — HEMOGLOBIN A1C
Hgb A1c MFr Bld: 5.8 % of total Hgb — ABNORMAL HIGH (ref <5.7)
Mean Plasma Glucose: 120 mg/dL
eAG (mmol/L): 6.6 mmol/L

## 2022-10-21 LAB — T4, FREE: Free T4: 1 ng/dL (ref 0.8–1.8)

## 2022-10-21 LAB — TSH: TSH: 1.05 mIU/L

## 2022-11-01 ENCOUNTER — Encounter: Payer: Self-pay | Admitting: Internal Medicine

## 2022-11-01 ENCOUNTER — Ambulatory Visit (INDEPENDENT_AMBULATORY_CARE_PROVIDER_SITE_OTHER): Payer: Medicare Other | Admitting: Internal Medicine

## 2022-11-01 VITALS — BP 106/62 | HR 61 | Temp 96.8°F | Wt 134.0 lb

## 2022-11-01 DIAGNOSIS — R300 Dysuria: Secondary | ICD-10-CM | POA: Diagnosis not present

## 2022-11-01 DIAGNOSIS — J0100 Acute maxillary sinusitis, unspecified: Secondary | ICD-10-CM

## 2022-11-01 LAB — POCT URINALYSIS DIPSTICK
Bilirubin, UA: NEGATIVE
Blood, UA: NEGATIVE
Glucose, UA: NEGATIVE
Ketones, UA: NEGATIVE
Leukocytes, UA: NEGATIVE
Nitrite, UA: NEGATIVE
Protein, UA: NEGATIVE
Spec Grav, UA: <=1.005 — AB (ref 1.010–1.025)
Urobilinogen, UA: 0.2 U/dL
pH, UA: 5 (ref 5.0–8.0)

## 2022-11-01 MED ORDER — AMOXICILLIN-POT CLAVULANATE 875-125 MG PO TABS: 1.0000 | ORAL_TABLET | Freq: Two times a day (BID) | ORAL | 0 refills | Status: DC
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
## 2022-11-01 NOTE — Patient Instructions (Signed)
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
## 2022-11-10 ENCOUNTER — Ambulatory Visit: Payer: Medicare Other | Attending: Cardiology | Admitting: Cardiology

## 2022-11-10 ENCOUNTER — Encounter: Payer: Self-pay | Admitting: Cardiology

## 2022-11-10 VITALS — BP 118/70 | HR 49 | Ht 63.0 in | Wt 133.2 lb

## 2022-11-10 DIAGNOSIS — R55 Syncope and collapse: Secondary | ICD-10-CM | POA: Diagnosis present

## 2022-11-10 DIAGNOSIS — R001 Bradycardia, unspecified: Secondary | ICD-10-CM | POA: Diagnosis not present

## 2022-11-10 NOTE — Patient Instructions (Signed)
Medication Instructions:  The current medical regimen is effective;  continue present plan and medications as directed. Please refer to the Current Medication list given to you today.   *If you need a refill on your cardiac medications before your next appointment, please call your pharmacy*   Follow-Up: At [facility], you and your health needs are our priority.  As part of our continuing mission to provide you with exceptional heart care, we have created designated Provider Care Teams.  These Care Teams include your primary Cardiologist (physician) and Advanced Practice Providers (APPs -  Physician Assistants and Nurse Practitioners) who all work together to provide you with the care you need, when you need it.  We recommend signing up for the patient portal called "MyChart".  Sign up information is provided on this After Visit Summary.  MyChart is used to connect with patients for Virtual Visits (Telemedicine).  Patients are able to view lab/test results, encounter notes, upcoming appointments, etc.  Non-urgent messages can be sent to your provider as well.   To learn more about what you can do with MyChart, go to [email or web address].    Your next appointment:   3-4 month(s)  Provider:   Sherie Don, NP

## 2022-11-17 ENCOUNTER — Emergency Department
Admission: EM | Admit: 2022-11-17 | Discharge: 2022-11-17 | Disposition: A | Discharge status: Home / Self Care | Payer: No Typology Code available for payment source | Attending: Emergency Medicine | Admitting: Emergency Medicine

## 2022-11-17 ENCOUNTER — Other Ambulatory Visit: Payer: Self-pay

## 2022-11-17 DIAGNOSIS — Y92410 Unspecified street and highway as the place of occurrence of the external cause: Secondary | ICD-10-CM | POA: Diagnosis not present

## 2022-11-17 DIAGNOSIS — M545 Low back pain, unspecified: Secondary | ICD-10-CM | POA: Insufficient documentation

## 2022-11-17 MED ORDER — LIDOCAINE 5 % EX PTCH
1.0000 | MEDICATED_PATCH | CUTANEOUS | Status: DC
  Administered 2022-11-17: 1 via TRANSDERMAL
  Filled 2022-11-17: qty 1

## 2022-11-17 MED ORDER — CYCLOBENZAPRINE HCL 5 MG PO TABS: 5.0000 mg | ORAL_TABLET | Freq: Three times a day (TID) | ORAL | 0 refills | Status: DC | PRN
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
## 2023-02-14 ENCOUNTER — Other Ambulatory Visit: Payer: Self-pay

## 2023-02-15 ENCOUNTER — Ambulatory Visit: Payer: Medicare Other | Admitting: Cardiology

## 2023-02-15 ENCOUNTER — Ambulatory Visit (INDEPENDENT_AMBULATORY_CARE_PROVIDER_SITE_OTHER): Payer: Medicare Other | Admitting: Physician Assistant

## 2023-02-15 ENCOUNTER — Encounter: Payer: Self-pay | Admitting: Physician Assistant

## 2023-02-15 VITALS — BP 120/79 | HR 60 | Temp 97.7°F | Ht 63.0 in | Wt 134.6 lb

## 2023-02-15 DIAGNOSIS — R1013 Epigastric pain: Secondary | ICD-10-CM | POA: Diagnosis not present

## 2023-02-15 DIAGNOSIS — K582 Mixed irritable bowel syndrome: Secondary | ICD-10-CM

## 2023-02-15 DIAGNOSIS — R1032 Left lower quadrant pain: Secondary | ICD-10-CM

## 2023-02-15 DIAGNOSIS — K219 Gastro-esophageal reflux disease without esophagitis: Secondary | ICD-10-CM | POA: Diagnosis not present

## 2023-02-15 DIAGNOSIS — R1012 Left upper quadrant pain: Secondary | ICD-10-CM | POA: Diagnosis not present

## 2023-02-15 DIAGNOSIS — R1084 Generalized abdominal pain: Secondary | ICD-10-CM

## 2023-02-15 DIAGNOSIS — K625 Hemorrhage of anus and rectum: Secondary | ICD-10-CM

## 2023-02-15 MED ORDER — OMEPRAZOLE 20 MG PO CPDR: 20.0000 mg | DELAYED_RELEASE_CAPSULE | Freq: Every day | ORAL | 5 refills | Status: DC

## 2023-02-15 NOTE — Progress Notes (Signed)
Cardiology Office Note Date:  02/18/2023  Patient ID:  Lindsey, Davenport May 05, 1980, MRN [id number] PCP:  Lorre Munroe, NP  Cardiologist:  Debbe Odea, MD Electrophysiologist: Regan Lemming, MD     Chief Complaint: bradycardia  History of Present Illness: Lindsey Davenport is a 43 y.o. female with PMH notable for hypothyroid, tobacco use, substance abuse, bradycardia; seen today for Will Jorja Loa, MD for routine follow-up.   She was seen by myself and virtually Dr. Elberta Fortis 10/2022 after she syncopized while showering. Prior to syncope episode, she had been having intermittent dizziness for ~1 month, sometimes while sitting or standing or with position changes. A two week monitor at discharge showed one SVT episode of 11 beats, symptom trigger corresponded to sinus +/- PVCs.  On follow-up today, she has noticed increase stress and anxiety in her life and has been having some increased palpitations during these episodes. She has had no further syncope. She is working with primary care and mental health services offered through work to manage stress and anxiety. .     ROS:  Please see the history of present illness. All other systems are reviewed and otherwise negative.   PHYSICAL EXAM:  VS:  BP 106/76   Pulse 65   Ht 5\' 3"  (1.6 m)   Wt 131 lb (59.4 kg)   SpO2 100%   BMI 23.21 kg/m  BMI: Body mass index is 23.21 kg/m.  Wt Readings from Last 3 Encounters:  02/18/23 131 lb (59.4 kg)  02/15/23 134 lb 9.6 oz (61.1 kg)  11/17/22 130 lb (59 kg)    GEN- The patient is well appearing, alert and oriented x 3 today.   Lungs- Clear to ausculation bilaterally, normal work of breathing.  Heart- Regular, bradycardic rate and rhythm, no murmurs, rubs or gallops Extremities- No peripheral edema, warm, dry   EKG is ordered. Personal review of EKG from today shows:    EKG Interpretation Date/Time:  Friday February 18 2023 11:08:09 EST Ventricular Rate:  65 PR  Interval:  152 QRS Duration:  90 QT Interval:  374 QTC Calculation: 388 R Axis:   -59  Text Interpretation: Normal sinus rhythm Left axis deviation Confirmed by Sherie Don [id number]) on 02/18/2023 11:13:06 AM     Additional studies reviewed include: Previous EP, cardiology notes.   Long term monitor, 10/28/2022 HR 35 - 136, average 57. 1 SVT lasting 11 beats. Rare supraventricular and ventricular ectopy. No sustained arrhythmias. No atrial fibrillation. Symptom trigger episodes correspond to sinus rhythm +/- PVC's.  TTE, 10/08/2022  1. Left ventricular ejection fraction, by estimation, is 55 to 60%. Left ventricular ejection fraction by PLAX is 55 %. The left ventricle has normal function. The left ventricle has no regional wall motion abnormalities. Left ventricular diastolic parameters were normal.   2. Right ventricular systolic function is normal. The right ventricular size is normal. There is normal pulmonary artery systolic pressure.   3. The mitral valve is normal in structure. Mild mitral valve regurgitation.   4. The aortic valve is tricuspid. Aortic valve regurgitation is not visualized.   5. The inferior vena cava is normal in size with greater than 50% respiratory variability, suggesting right atrial pressure of 3 mmHg.    ASSESSMENT AND PLAN:  #) Bradycardia #) syncope  #) PVCs No further syncopal episodes Do not favor adding BB for PVCs with bradycardia Recommend to continue to work with PCP and mental health providers for stress and anxiety mgmt.  Current medicines are reviewed at length with the patient today.   The patient does not have concerns regarding her medicines.  The following changes were made today:  none  Labs/ tests ordered today include:  Orders Placed This Encounter  Procedures   EKG 12-Lead     Disposition: Follow up with EP APP in 6 months   Signed, Sherie Don, NP  02/18/23  12:20 PM  Electrophysiology [facility]
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
## 2023-02-15 NOTE — Patient Instructions (Signed)
   Start OTC Benefiber Powder. Mix 1 - 2 Tablespoons in 6 - 8 ounces of a Drink Once Daily. Drink 64 ounces of water / fluids Daily.

## 2023-02-16 LAB — COMPREHENSIVE METABOLIC PANEL
ALT: 14 [IU]/L (ref 0–32)
AST: 17 [IU]/L (ref 0–40)
Albumin: 4.7 g/dL (ref 3.9–4.9)
Alkaline Phosphatase: 104 [IU]/L (ref 44–121)
BUN/Creatinine Ratio: 13 (ref 9–23)
BUN: 9 mg/dL (ref 6–24)
Bilirubin Total: 0.3 mg/dL (ref 0.0–1.2)
CO2: 23 mmol/L (ref 20–29)
Calcium: 9.7 mg/dL (ref 8.7–10.2)
Chloride: 105 mmol/L (ref 96–106)
Creatinine, Ser: 0.68 mg/dL (ref 0.57–1.00)
Globulin, Total: 2.2 g/dL (ref 1.5–4.5)
Glucose: 90 mg/dL (ref 70–99)
Potassium: 4.6 mmol/L (ref 3.5–5.2)
Sodium: 143 mmol/L (ref 134–144)
Total Protein: 6.9 g/dL (ref 6.0–8.5)
eGFR: 110 mL/min/{1.73_m2} (ref >59)

## 2023-02-16 LAB — CBC WITH DIFFERENTIAL/PLATELET
Basophils Absolute: 0 10*3/uL (ref 0.0–0.2)
Basos: 1 %
EOS (ABSOLUTE): 0.3 10*3/uL (ref 0.0–0.4)
Eos: 5 %
Hematocrit: 44.7 % (ref 34.0–46.6)
Hemoglobin: 15.1 g/dL (ref 11.1–15.9)
Immature Grans (Abs): 0 10*3/uL (ref 0.0–0.1)
Immature Granulocytes: 0 %
Lymphocytes Absolute: 3.6 10*3/uL — ABNORMAL HIGH (ref 0.7–3.1)
Lymphs: 58 %
MCH: 31.3 pg (ref 26.6–33.0)
MCHC: 33.8 g/dL (ref 31.5–35.7)
MCV: 93 fL (ref 79–97)
Monocytes Absolute: 0.3 10*3/uL (ref 0.1–0.9)
Monocytes: 5 %
Neutrophils Absolute: 1.9 10*3/uL (ref 1.4–7.0)
Neutrophils: 31 %
Platelets: 247 10*3/uL (ref 150–450)
RBC: 4.82 x10E6/uL (ref 3.77–5.28)
RDW: 12.4 % (ref 11.7–15.4)
WBC: 6.1 10*3/uL (ref 3.4–10.8)

## 2023-02-16 LAB — LIPASE: Lipase: 66 U/L (ref 14–72)

## 2023-02-16 LAB — C-REACTIVE PROTEIN: CRP: <1 mg/L (ref 0–10)

## 2023-02-18 ENCOUNTER — Ambulatory Visit: Payer: Medicare Other | Attending: Cardiology | Admitting: Cardiology

## 2023-02-18 ENCOUNTER — Encounter: Payer: Self-pay | Admitting: Cardiology

## 2023-02-18 VITALS — BP 106/76 | HR 65 | Ht 63.0 in | Wt 131.0 lb

## 2023-02-18 DIAGNOSIS — R55 Syncope and collapse: Secondary | ICD-10-CM | POA: Diagnosis present

## 2023-02-18 DIAGNOSIS — R001 Bradycardia, unspecified: Secondary | ICD-10-CM | POA: Diagnosis present

## 2023-02-18 NOTE — Patient Instructions (Signed)
Medication Instructions:  - Your physician recommends that you continue on your current medications as directed. Please refer to the Current Medication list given to you today.   *If you need a refill on your cardiac medications before your next appointment, please call your pharmacy*   Lab Work: - none ordered  If you have labs (blood work) drawn today and your tests are completely normal, you will receive your results only by: MyChart Message (if you have MyChart) OR A paper copy in the mail If you have any lab test that is abnormal or we need to change your treatment, we will call you to review the results.   Testing/Procedures: - none ordered   Follow-Up: At [facility], you and your health needs are our priority.  As part of our continuing mission to provide you with exceptional heart care, we have created designated Provider Care Teams.  These Care Teams include your primary Cardiologist (physician) and Advanced Practice Providers (APPs -  Physician Assistants and Nurse Practitioners) who all work together to provide you with the care you need, when you need it.  We recommend signing up for the patient portal called "MyChart".  Sign up information is provided on this After Visit Summary.  MyChart is used to connect with patients for Virtual Visits (Telemedicine).  Patients are able to view lab/test results, encounter notes, upcoming appointments, etc.  Non-urgent messages can be sent to your provider as well.   To learn more about what you can do with MyChart, go to [email or web address].    Your next appointment:   6 month(s)  Provider:   You may see Will Jorja Loa, MD or one of the following Advanced Practice Providers on your designated Care Team:    Sherie Don, NP    Other Instructions N/a

## 2023-02-19 ENCOUNTER — Encounter: Payer: Self-pay | Admitting: Physician Assistant

## 2023-02-21 ENCOUNTER — Other Ambulatory Visit: Payer: Self-pay | Admitting: Physician Assistant

## 2023-02-21 DIAGNOSIS — K625 Hemorrhage of anus and rectum: Secondary | ICD-10-CM

## 2023-02-21 MED ORDER — HYDROCORTISONE ACETATE 25 MG RE SUPP: 25.0000 mg | Freq: Every day | RECTAL | 1 refills | Status: DC

## 2023-02-21 NOTE — Progress Notes (Signed)
Hi Mrs. Hofland, your absolute lymphocytes is barely elevated and not clinically significant.  Not worrisome.  I am sending prescription for hydrocortisone suppository to your pharmacy to treat possible internal hemorrhoids and to help with rectal bleeding.  Insert 1 suppository into the rectum at bedtime once daily for 12 days.  If rectal bleeding does not improve, then we will schedule repeat colonoscopy. Celso Amy, PA-C

## 2023-03-22 ENCOUNTER — Other Ambulatory Visit: Payer: Self-pay

## 2023-03-24 ENCOUNTER — Ambulatory Visit: Payer: Medicare Other | Admitting: Physician Assistant

## 2023-04-04 ENCOUNTER — Other Ambulatory Visit: Payer: Self-pay
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
## 2023-04-05 ENCOUNTER — Encounter: Payer: Self-pay | Admitting: Physician Assistant

## 2023-04-05 ENCOUNTER — Telehealth: Payer: Self-pay

## 2023-04-05 ENCOUNTER — Ambulatory Visit (INDEPENDENT_AMBULATORY_CARE_PROVIDER_SITE_OTHER): Payer: Medicare Other | Admitting: Physician Assistant

## 2023-04-05 ENCOUNTER — Other Ambulatory Visit: Payer: Self-pay | Admitting: Physician Assistant

## 2023-04-05 VITALS — BP 115/68 | HR 76 | Temp 97.8°F | Ht 63.0 in | Wt 136.8 lb

## 2023-04-05 DIAGNOSIS — K582 Mixed irritable bowel syndrome: Secondary | ICD-10-CM

## 2023-04-05 DIAGNOSIS — K644 Residual hemorrhoidal skin tags: Secondary | ICD-10-CM | POA: Diagnosis not present

## 2023-04-05 DIAGNOSIS — K649 Unspecified hemorrhoids: Secondary | ICD-10-CM

## 2023-04-05 DIAGNOSIS — K625 Hemorrhage of anus and rectum: Secondary | ICD-10-CM

## 2023-04-05 DIAGNOSIS — R1013 Epigastric pain: Secondary | ICD-10-CM

## 2023-04-05 DIAGNOSIS — K219 Gastro-esophageal reflux disease without esophagitis: Secondary | ICD-10-CM

## 2023-04-05 DIAGNOSIS — K5904 Chronic idiopathic constipation: Secondary | ICD-10-CM

## 2023-04-05 DIAGNOSIS — K2970 Gastritis, unspecified, without bleeding: Secondary | ICD-10-CM

## 2023-04-05 MED ORDER — NA SULFATE-K SULFATE-MG SULF 17.5-3.13-1.6 GM/177ML PO SOLN
1.0000 | Freq: Once | ORAL | 0 refills | Status: AC
Start: 2023-04-05 — End: 2023-04-05

## 2023-04-05 MED ORDER — PEG 3350-KCL-NA BICARB-NACL 420 G PO SOLR
4000.0000 mL | Freq: Once | ORAL | 0 refills | Status: AC
Start: 2023-04-05 — End: 2023-04-05

## 2023-04-05 NOTE — Patient Instructions (Signed)
 To Treat External Hemorrhoids: Warm water sitz bath with epsom salt for flare up of external hemorrhoids. Use OTC Preparation H, Tucks Pads, and Witch Hazel wipes as needed.

## 2023-04-05 NOTE — Telephone Encounter (Signed)
Suprep sent to pharmacy 

## 2023-04-21 ENCOUNTER — Ambulatory Visit (INDEPENDENT_AMBULATORY_CARE_PROVIDER_SITE_OTHER): Payer: Medicare Other | Admitting: Internal Medicine

## 2023-04-21 ENCOUNTER — Other Ambulatory Visit: Payer: Self-pay | Admitting: Internal Medicine

## 2023-04-21 ENCOUNTER — Encounter: Payer: Self-pay | Admitting: Internal Medicine

## 2023-04-21 VITALS — BP 102/68 | Ht 63.0 in | Wt 133.4 lb

## 2023-04-21 DIAGNOSIS — E782 Mixed hyperlipidemia: Secondary | ICD-10-CM

## 2023-04-21 DIAGNOSIS — L2082 Flexural eczema: Secondary | ICD-10-CM

## 2023-04-21 DIAGNOSIS — F319 Bipolar disorder, unspecified: Secondary | ICD-10-CM

## 2023-04-21 DIAGNOSIS — E039 Hypothyroidism, unspecified: Secondary | ICD-10-CM | POA: Diagnosis not present

## 2023-04-21 DIAGNOSIS — F1911 Other psychoactive substance abuse, in remission: Secondary | ICD-10-CM | POA: Diagnosis not present

## 2023-04-21 DIAGNOSIS — F4310 Post-traumatic stress disorder, unspecified: Secondary | ICD-10-CM

## 2023-04-21 DIAGNOSIS — Z1231 Encounter for screening mammogram for malignant neoplasm of breast: Secondary | ICD-10-CM

## 2023-04-21 DIAGNOSIS — F411 Generalized anxiety disorder: Secondary | ICD-10-CM

## 2023-04-21 DIAGNOSIS — K219 Gastro-esophageal reflux disease without esophagitis: Secondary | ICD-10-CM

## 2023-04-21 DIAGNOSIS — G2581 Restless legs syndrome: Secondary | ICD-10-CM

## 2023-04-21 DIAGNOSIS — R7303 Prediabetes: Secondary | ICD-10-CM | POA: Diagnosis not present

## 2023-04-21 DIAGNOSIS — M797 Fibromyalgia: Secondary | ICD-10-CM

## 2023-04-21 DIAGNOSIS — L309 Dermatitis, unspecified: Secondary | ICD-10-CM | POA: Insufficient documentation

## 2023-04-21 MED ORDER — TRIAMCINOLONE ACETONIDE 0.025 % EX OINT: 1.0000 | TOPICAL_OINTMENT | Freq: Two times a day (BID) | CUTANEOUS | 0 refills | Status: DC

## 2023-04-21 MED ORDER — CETIRIZINE HCL 10 MG PO TABS
10.0000 mg | ORAL_TABLET | Freq: Every day | ORAL | 1 refills | Status: DC
Start: 2023-04-21 — End: 2023-10-24

## 2023-04-21 NOTE — Assessment & Plan Note (Signed)
 C-Met and lipid profile today Encouraged her to consume a low-fat diet

## 2023-04-21 NOTE — Assessment & Plan Note (Signed)
 She has an upcoming endoscopy scheduled Continue Meprazole

## 2023-04-21 NOTE — Assessment & Plan Note (Signed)
 Rx for triamcinolone cream 0.125% twice daily.

## 2023-04-21 NOTE — Assessment & Plan Note (Signed)
 A1c today Encourage low-carb diet and exercise for weight loss

## 2023-04-21 NOTE — Assessment & Plan Note (Signed)
Doing well off meds Support offered

## 2023-04-21 NOTE — Patient Instructions (Signed)

## 2023-04-21 NOTE — Assessment & Plan Note (Signed)
 Not medicated We will monitor
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
## 2023-04-21 NOTE — Assessment & Plan Note (Signed)
 Encouraged regular stretching and muscle toning Continue Tylenol or ibuprofen OTC as needed

## 2023-04-21 NOTE — Assessment & Plan Note (Signed)
 TSH today

## 2023-04-21 NOTE — Assessment & Plan Note (Signed)
In remission at this time

## 2023-04-21 NOTE — Assessment & Plan Note (Signed)
 She is currently meeting with a therapist weekly

## 2023-04-22 ENCOUNTER — Encounter: Payer: Self-pay | Admitting: Internal Medicine

## 2023-04-22 LAB — CBC
HCT: 43 % (ref 35.0–45.0)
Hemoglobin: 14.6 g/dL (ref 11.7–15.5)
MCH: 30.8 pg (ref 27.0–33.0)
MCHC: 34 g/dL (ref 32.0–36.0)
MCV: 90.7 fL (ref 80.0–100.0)
MPV: 11.5 fL (ref 7.5–12.5)
Platelets: 213 10*3/uL (ref 140–400)
RBC: 4.74 10*6/uL (ref 3.80–5.10)
RDW: 11.8 % (ref 11.0–15.0)
WBC: 5 10*3/uL (ref 3.8–10.8)

## 2023-04-22 LAB — COMPLETE METABOLIC PANEL WITH GFR
AG Ratio: 2.1 (calc) (ref 1.0–2.5)
ALT: 11 U/L (ref 6–29)
AST: 14 U/L (ref 10–30)
Albumin: 4.4 g/dL (ref 3.6–5.1)
Alkaline phosphatase (APISO): 67 U/L (ref 31–125)
BUN: 8 mg/dL (ref 7–25)
CO2: 30 mmol/L (ref 20–32)
Calcium: 9.5 mg/dL (ref 8.6–10.2)
Chloride: 107 mmol/L (ref 98–110)
Creat: 0.66 mg/dL (ref 0.50–0.99)
Globulin: 2.1 g/dL (ref 1.9–3.7)
Glucose, Bld: 75 mg/dL (ref 65–139)
Potassium: 3.9 mmol/L (ref 3.5–5.3)
Sodium: 144 mmol/L (ref 135–146)
Total Bilirubin: 0.5 mg/dL (ref 0.2–1.2)
Total Protein: 6.5 g/dL (ref 6.1–8.1)

## 2023-04-22 LAB — LIPID PANEL
Cholesterol: 192 mg/dL (ref <200)
HDL: 54 mg/dL (ref >=50)
LDL Cholesterol (Calc): 114 mg/dL — ABNORMAL HIGH
Non-HDL Cholesterol (Calc): 138 mg/dL — ABNORMAL HIGH (ref <130)
Total CHOL/HDL Ratio: 3.6 (calc) (ref <5.0)
Triglycerides: 128 mg/dL (ref <150)

## 2023-04-22 LAB — HEMOGLOBIN A1C
Hgb A1c MFr Bld: 5.5 %{Hb} (ref <5.7)
Mean Plasma Glucose: 111 mg/dL
eAG (mmol/L): 6.2 mmol/L

## 2023-04-22 LAB — TSH: TSH: 0.78 m[IU]/L

## 2023-04-27 ENCOUNTER — Encounter: Payer: Self-pay | Admitting: Gastroenterology

## 2023-04-27 ENCOUNTER — Ambulatory Visit: Admitting: Anesthesiology

## 2023-04-27 ENCOUNTER — Ambulatory Visit
Admission: RE | Admit: 2023-04-27 | Discharge: 2023-04-27 | Disposition: A | Discharge status: Home / Self Care | Source: Ambulatory Visit | Attending: Gastroenterology | Admitting: Gastroenterology

## 2023-04-27 ENCOUNTER — Encounter
Admission: RE | Disposition: A | Discharge status: Home / Self Care | Payer: Self-pay | Source: Ambulatory Visit | Attending: Gastroenterology

## 2023-04-27 DIAGNOSIS — D126 Benign neoplasm of colon, unspecified: Secondary | ICD-10-CM

## 2023-04-27 DIAGNOSIS — D122 Benign neoplasm of ascending colon: Secondary | ICD-10-CM | POA: Insufficient documentation

## 2023-04-27 DIAGNOSIS — E039 Hypothyroidism, unspecified: Secondary | ICD-10-CM | POA: Diagnosis not present

## 2023-04-27 DIAGNOSIS — Z5986 Financial insecurity: Secondary | ICD-10-CM | POA: Insufficient documentation

## 2023-04-27 DIAGNOSIS — K640 First degree hemorrhoids: Secondary | ICD-10-CM | POA: Insufficient documentation

## 2023-04-27 DIAGNOSIS — K625 Hemorrhage of anus and rectum: Secondary | ICD-10-CM | POA: Diagnosis present

## 2023-04-27 DIAGNOSIS — F319 Bipolar disorder, unspecified: Secondary | ICD-10-CM | POA: Insufficient documentation

## 2023-04-27 DIAGNOSIS — K219 Gastro-esophageal reflux disease without esophagitis: Secondary | ICD-10-CM | POA: Diagnosis not present

## 2023-04-27 DIAGNOSIS — K2970 Gastritis, unspecified, without bleeding: Secondary | ICD-10-CM | POA: Insufficient documentation

## 2023-04-27 DIAGNOSIS — K635 Polyp of colon: Secondary | ICD-10-CM

## 2023-04-27 DIAGNOSIS — Z87891 Personal history of nicotine dependence: Secondary | ICD-10-CM | POA: Diagnosis not present

## 2023-04-27 DIAGNOSIS — D125 Benign neoplasm of sigmoid colon: Secondary | ICD-10-CM | POA: Insufficient documentation

## 2023-04-27 DIAGNOSIS — D124 Benign neoplasm of descending colon: Secondary | ICD-10-CM | POA: Insufficient documentation

## 2023-04-27 DIAGNOSIS — F419 Anxiety disorder, unspecified: Secondary | ICD-10-CM | POA: Diagnosis not present

## 2023-04-27 DIAGNOSIS — R1013 Epigastric pain: Secondary | ICD-10-CM | POA: Diagnosis present

## 2023-04-27 HISTORY — PX: ESOPHAGOGASTRODUODENOSCOPY: SHX5428

## 2023-04-27 HISTORY — PX: POLYPECTOMY: SHX5525

## 2023-04-27 HISTORY — PX: COLONOSCOPY: SHX5424

## 2023-04-27 LAB — POCT PREGNANCY, URINE: Preg Test, Ur: NEGATIVE

## 2023-04-27 SURGERY — COLONOSCOPY
Anesthesia: General

## 2023-04-27 MED ORDER — LIDOCAINE HCL (CARDIAC) PF 100 MG/5ML IV SOSY
PREFILLED_SYRINGE | INTRAVENOUS | Status: DC | PRN
  Administered 2023-04-27: 50 mg via INTRAVENOUS

## 2023-04-27 MED ORDER — GLYCOPYRROLATE 0.2 MG/ML IJ SOLN
INTRAMUSCULAR | Status: DC | PRN
  Administered 2023-04-27: .2 mg via INTRAVENOUS

## 2023-04-27 MED ORDER — PROPOFOL 10 MG/ML IV BOLUS
INTRAVENOUS | Status: DC | PRN
  Administered 2023-04-27: 80 mg via INTRAVENOUS

## 2023-04-27 MED ORDER — DEXMEDETOMIDINE HCL IN NACL 80 MCG/20ML IV SOLN
INTRAVENOUS | Status: DC | PRN
Start: 2023-04-27 — End: 2023-04-27
  Administered 2023-04-27: 10 ug via INTRAVENOUS

## 2023-04-27 MED ORDER — SODIUM CHLORIDE 0.9 % IV SOLN: INTRAVENOUS | Status: DC

## 2023-04-27 MED ORDER — PROPOFOL 500 MG/50ML IV EMUL
INTRAVENOUS | Status: DC | PRN
Start: 2023-04-27 — End: 2023-04-27
  Administered 2023-04-27: 150 ug/kg/min via INTRAVENOUS
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
## 2023-04-27 NOTE — Anesthesia Procedure Notes (Signed)
 Procedure Name: MAC Date/Time: 04/27/2023 12:00 PM  Performed by: Cheral Bay, CRNAPre-anesthesia Checklist: Patient identified, Emergency Drugs available, Suction available, Patient being monitored and Timeout performed Patient Re-evaluated:Patient Re-evaluated prior to induction Oxygen Delivery Method: Nasal cannula Induction Type: IV induction Placement Confirmation: positive ETCO2 and CO2 detector

## 2023-04-27 NOTE — Transfer of Care (Signed)
 Immediate Anesthesia Transfer of Care Note  Patient: Lindsey Davenport  Procedure(s) Performed: COLONOSCOPY EGD (ESOPHAGOGASTRODUODENOSCOPY) POLYPECTOMY  Patient Location: PACU and Endoscopy Unit  Anesthesia Type:General  Level of Consciousness: drowsy  Airway & Oxygen Therapy: Patient Spontanous Breathing  Post-op Assessment: Report given to RN and Post -op Vital signs reviewed and stable  Post vital signs: Reviewed and stable  Last Vitals:  Vitals Value Taken Time  BP    Temp    Pulse    Resp    SpO2      Last Pain:  Vitals:   04/27/23 1103  TempSrc: Temporal  PainSc: 1          Complications: No notable events documented.

## 2023-04-27 NOTE — Op Note (Signed)
 [facility] Patient Name: Leylah Tarnow Procedure Date: 04/27/2023 11:02 AM MRN: [id number] Account #: [id number] Date of Birth: 1980-02-28 Admit Type: Outpatient Age: 43 Room: [facility] ENDO ROOM 3 Gender: Female Note Status: Finalized Instrument Name: Prentice Docker [id number] Procedure:             Colonoscopy Indications:           Rectal bleeding Providers:             Wyline Mood MD, MD Medicines:             Monitored Anesthesia Care Complications:         No immediate complications. Procedure:             Pre-Anesthesia Assessment:                        - Prior to the procedure, a History and Physical was                         performed, and patient medications, allergies and                         sensitivities were reviewed. The patient's tolerance                         of previous anesthesia was reviewed.                        - The risks and benefits of the procedure and the                         sedation options and risks were discussed with the                         patient. All questions were answered and informed                         consent was obtained.                        - ASA Grade Assessment: II - A patient with mild                         systemic disease.                        After obtaining informed consent, the colonoscope was                         passed under direct vision. Throughout the procedure,                         the patient's blood pressure, pulse, and oxygen                         saturations were monitored continuously. The                         Colonoscope was introduced through the anus and  advanced to the the cecum, identified by the                         appendiceal orifice. The colonoscopy was performed                         with ease. The patient tolerated the procedure well.                         The quality of the bowel preparation was good. The                          ileocecal valve, appendiceal orifice, and rectum were                         photographed. Findings:      The perianal and digital rectal examinations were normal.      Non-bleeding internal hemorrhoids were found during retroflexion. The       hemorrhoids were large and Grade I (internal hemorrhoids that do not       prolapse).      Three sessile polyps were found in the ascending colon. The polyps were       4 to 5 mm in size. These polyps were removed with a cold snare.       Resection and retrieval were complete.      An 8 mm polyp was found in the descending colon. The polyp was       semi-pedunculated. The polyp was removed with a cold snare. Resection       and retrieval were complete. To prevent bleeding after the polypectomy,       one hemostatic clip was successfully placed. Clip manufacturer: [employer]. There was no bleeding at the end of the procedure.      A 5 mm polyp was found in the sigmoid colon. The polyp was sessile. The       polyp was removed with a cold snare. Resection and retrieval were       complete.      The exam was otherwise without abnormality on direct and retroflexion       views. Impression:            - Non-bleeding internal hemorrhoids.                        - Three 4 to 5 mm polyps in the ascending colon,                         removed with a cold snare. Resected and retrieved.                        - One 8 mm polyp in the descending colon, removed with                         a cold snare. Resected and retrieved. Clip was placed.                         Clip manufacturer: [employer].                        -  One 5 mm polyp in the sigmoid colon, removed with a                         cold snare. Resected and retrieved.                        - The examination was otherwise normal on direct and                         retroflexion views. Recommendation:        - Discharge patient to home (with escort).                         - Resume previous diet.                        - Continue present medications.                        - Await pathology results.                        - Repeat colonoscopy date to be determined after                         pending pathology results are reviewed for                         surveillance. Procedure Code(s):     --- Professional ---                        [id number], Colonoscopy, flexible; with removal of                         tumor(s), polyp(s), or other lesion(s) by snare                         technique Diagnosis Code(s):     --- Professional ---                        D12.2, Benign neoplasm of ascending colon                        D12.4, Benign neoplasm of descending colon                        D12.5, Benign neoplasm of sigmoid colon                        K64.0, First degree hemorrhoids                        K62.5, Hemorrhage of anus and rectum CPT copyright 2022 American Medical Association. All rights reserved. The codes documented in this report are preliminary and upon coder review may  be revised to meet current compliance requirements. Wyline Mood, MD Wyline Mood MD, MD 04/27/2023 12:32:35 PM This report has been signed electronically. Number of Addenda: 0 Note Initiated On: 04/27/2023 11:02 AM Scope Withdrawal Time: 0 hours 10 minutes 37 seconds  Total Procedure Duration: 0 hours 19 minutes  43 seconds  Estimated Blood Loss:  Estimated blood loss: none.      [facility]

## 2023-04-27 NOTE — Anesthesia Postprocedure Evaluation (Signed)
 Anesthesia Post Note  Patient: Lindsey Davenport  Procedure(s) Performed: COLONOSCOPY EGD (ESOPHAGOGASTRODUODENOSCOPY) POLYPECTOMY  Patient location during evaluation: Endoscopy Anesthesia Type: General Level of consciousness: awake and alert Pain management: pain level controlled Vital Signs Assessment: post-procedure vital signs reviewed and stable Respiratory status: spontaneous breathing, nonlabored ventilation, respiratory function stable and patient connected to nasal cannula oxygen Cardiovascular status: blood pressure returned to baseline and stable Postop Assessment: no apparent nausea or vomiting Anesthetic complications: no   No notable events documented.   Last Vitals:  Vitals:   04/27/23 1244 04/27/23 1254  BP: (!) 84/57 95/82  Pulse: (!) 59 62  Resp: 18 16  Temp:    SpO2: 100% 100%    Last Pain:  Vitals:   04/27/23 1244  TempSrc:   PainSc: 0-No pain                 Louie Boston
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
## 2023-04-27 NOTE — Op Note (Signed)
 [facility] Gastroenterology Patient Name: Lindsey Davenport Procedure Date: 04/27/2023 11:02 AM MRN: [id number] Account #: [id number] Date of Birth: 06/18/1980 Admit Type: Outpatient Age: 43 Room: [facility] ENDO ROOM 3 Gender: Female Note Status: Finalized Instrument Name: Patton Salles Endoscope [id number] Procedure:             Upper GI endoscopy Indications:           Abdominal pain Providers:             Wyline Mood MD, MD Medicines:             Monitored Anesthesia Care Complications:         No immediate complications. Procedure:             Pre-Anesthesia Assessment:                        - Prior to the procedure, a History and Physical was                         performed, and patient medications, allergies and                         sensitivities were reviewed. The patient's tolerance                         of previous anesthesia was reviewed.                        - The risks and benefits of the procedure and the                         sedation options and risks were discussed with the                         patient. All questions were answered and informed                         consent was obtained.                        - ASA Grade Assessment: II - A patient with mild                         systemic disease.                        After obtaining informed consent, the endoscope was                         passed under direct vision. Throughout the procedure,                         the patient's blood pressure, pulse, and oxygen                         saturations were monitored continuously. The Endoscope                         was introduced through the mouth, and advanced to the  third part of duodenum. The upper GI endoscopy was                         accomplished with ease. The patient tolerated the                         procedure well. Findings:      The examined duodenum was normal.      The esophagus was normal.       The cardia and gastric fundus were normal on retroflexion.      Localized mild inflammation characterized by congestion (edema) and       erythema was found in the gastric antrum. Biopsies were taken with a       cold forceps for histology. Impression:            - Normal examined duodenum.                        - Normal esophagus.                        - Gastritis. Biopsied. Recommendation:        - Await pathology results. Procedure Code(s):     --- Professional ---                        [phone], Esophagogastroduodenoscopy, flexible,                         transoral; with biopsy, single or multiple Diagnosis Code(s):     --- Professional ---                        K29.70, Gastritis, unspecified, without bleeding                        R10.9, Unspecified abdominal pain CPT copyright 2022 American Medical Association. All rights reserved. The codes documented in this report are preliminary and upon coder review may  be revised to meet current compliance requirements. Wyline Mood, MD Wyline Mood MD, MD 04/27/2023 60:45:40 PM This report has been signed electronically. Number of Addenda: 0 Note Initiated On: 04/27/2023 11:02 AM Estimated Blood Loss:  Estimated blood loss: none.      [facility]

## 2023-04-28 ENCOUNTER — Encounter: Payer: Self-pay | Admitting: Gastroenterology

## 2023-04-29 LAB — SURGICAL PATHOLOGY

## 2023-05-02 ENCOUNTER — Encounter: Payer: Self-pay | Admitting: Physician Assistant

## 2023-05-03 ENCOUNTER — Ambulatory Visit
Admission: RE | Admit: 2023-05-03 | Discharge: 2023-05-03 | Disposition: A | Discharge status: Home / Self Care | Payer: Medicare (Managed Care) | Source: Ambulatory Visit | Attending: Internal Medicine | Admitting: Internal Medicine

## 2023-05-03 ENCOUNTER — Encounter: Payer: Self-pay | Admitting: Gastroenterology

## 2023-05-03 ENCOUNTER — Telehealth: Payer: Self-pay

## 2023-05-03 ENCOUNTER — Ambulatory Visit

## 2023-05-03 DIAGNOSIS — Z1231 Encounter for screening mammogram for malignant neoplasm of breast: Secondary | ICD-10-CM | POA: Diagnosis present

## 2023-05-03 MED ORDER — OMEPRAZOLE 40 MG PO CPDR: 40.0000 mg | DELAYED_RELEASE_CAPSULE | Freq: Every day | ORAL | 1 refills | Status: DC

## 2023-05-03 NOTE — Addendum Note (Signed)
 Addended by: Martyn Ehrich on: 05/03/2023 01:01 PM   Modules accepted: Orders

## 2023-05-03 NOTE — Telephone Encounter (Signed)
 Left message for patient to return call to office.   Wyline Mood, MD  Martyn Ehrich, CMA Inform 1.  Biopsies in the stomach showed gastritis avoid NSAIDs if having symptoms can take a short course of Prilosec 40 mg for 4 to 6 weeks and then stop 2.  There were 3 sets of polyps taken out for some reason once it could not be retrieved meaning it probably was tiny and got suctioned out during the retrieval process and went away with the fluid.  The other 2 sets of polyps were all benign.  My endoscopy assessment of these polyps were that there were benign and were not concerning.  They were taken out completely.  They were not large in size hence I would recommend a repeat colonoscopy in 5 years

## 2023-05-06 ENCOUNTER — Encounter: Payer: Self-pay | Admitting: Internal Medicine

## 2023-05-06 MED ORDER — FLUTICASONE PROPIONATE 50 MCG/ACT NA SUSP: 2.0000 | Freq: Every day | NASAL | 3 refills | Status: DC

## 2023-05-16 ENCOUNTER — Emergency Department
Admission: EM | Admit: 2023-05-16 | Discharge: 2023-05-16 | Disposition: A | Discharge status: Home / Self Care | Payer: Medicare (Managed Care) | Attending: Emergency Medicine | Admitting: Emergency Medicine

## 2023-05-16 ENCOUNTER — Emergency Department: Payer: Medicare (Managed Care)

## 2023-05-16 ENCOUNTER — Other Ambulatory Visit: Payer: Self-pay

## 2023-05-16 DIAGNOSIS — R109 Unspecified abdominal pain: Secondary | ICD-10-CM | POA: Insufficient documentation

## 2023-05-16 LAB — COMPREHENSIVE METABOLIC PANEL WITH GFR
ALT: 21 U/L (ref 0–44)
AST: 21 U/L (ref 15–41)
Albumin: 3.5 g/dL (ref 3.5–5.0)
Alkaline Phosphatase: 48 U/L (ref 38–126)
Anion gap: 6 (ref 5–15)
BUN: 11 mg/dL (ref 6–20)
CO2: 29 mmol/L (ref 22–32)
Calcium: 9.2 mg/dL (ref 8.9–10.3)
Chloride: 105 mmol/L (ref 98–111)
Creatinine, Ser: 1 mg/dL (ref 0.44–1.00)
GFR, Estimated: >60 mL/min (ref >60)
Glucose, Bld: 99 mg/dL (ref 70–99)
Potassium: 4.1 mmol/L (ref 3.5–5.1)
Sodium: 140 mmol/L (ref 135–145)
Total Bilirubin: 0.3 mg/dL (ref 0.0–1.2)
Total Protein: 5.7 g/dL — ABNORMAL LOW (ref 6.5–8.1)

## 2023-05-16 LAB — URINALYSIS, ROUTINE W REFLEX MICROSCOPIC
Bilirubin Urine: NEGATIVE
Glucose, UA: NEGATIVE mg/dL
Hgb urine dipstick: NEGATIVE
Ketones, ur: NEGATIVE mg/dL
Leukocytes,Ua: NEGATIVE
Nitrite: NEGATIVE
Protein, ur: NEGATIVE mg/dL
Specific Gravity, Urine: 1.002 — ABNORMAL LOW (ref 1.005–1.030)
pH: 6 (ref 5.0–8.0)

## 2023-05-16 LAB — CBC WITH DIFFERENTIAL/PLATELET
Abs Immature Granulocytes: 0.01 10*3/uL (ref 0.00–0.07)
Basophils Absolute: 0 10*3/uL (ref 0.0–0.1)
Basophils Relative: 1 %
Eosinophils Absolute: 0.3 10*3/uL (ref 0.0–0.5)
Eosinophils Relative: 4 %
HCT: 42.8 % (ref 36.0–46.0)
Hemoglobin: 14.2 g/dL (ref 12.0–15.0)
Immature Granulocytes: 0 %
Lymphocytes Relative: 46 %
Lymphs Abs: 3.9 10*3/uL (ref 0.7–4.0)
MCH: 31 pg (ref 26.0–34.0)
MCHC: 33.2 g/dL (ref 30.0–36.0)
MCV: 93.4 fL (ref 80.0–100.0)
Monocytes Absolute: 0.4 10*3/uL (ref 0.1–1.0)
Monocytes Relative: 4 %
Neutro Abs: 3.8 10*3/uL (ref 1.7–7.7)
Neutrophils Relative %: 45 %
Platelets: 276 10*3/uL (ref 150–400)
RBC: 4.58 MIL/uL (ref 3.87–5.11)
RDW: 12.4 % (ref 11.5–15.5)
WBC: 8.4 10*3/uL (ref 4.0–10.5)
nRBC: 0 % (ref 0.0–0.2)

## 2023-05-16 LAB — LIPASE, BLOOD: Lipase: 157 U/L — ABNORMAL HIGH (ref 11–51)

## 2023-05-16 LAB — PREGNANCY, URINE: Preg Test, Ur: NEGATIVE

## 2023-05-16 NOTE — ED Provider Triage Note (Signed)
 Emergency Medicine Provider Triage Evaluation Note  Lindsey Davenport , a 43 y.o. female  was evaluated in triage.  Pt complains of right sided flank pain. Went to urgent care and told her to come here.  Review of Systems  Positive: Flank pain, right side Negative: Urinary symptoms, nausea, vomiting  Physical Exam  There were no vitals taken for this visit. Gen:   Awake, no distress   Resp:  Normal effort  MSK:   Moves extremities without difficulty  Other:    Medical Decision Making  Medically screening exam initiated at 6:05 PM.  Appropriate orders placed.  Drea H Gazda was informed that the remainder of the evaluation will be completed by another provider, this initial triage assessment does not replace that evaluation, and the importance of remaining in the ED until their evaluation is complete.     Phyliss Breen, PA-C 05/16/23 1806

## 2023-05-16 NOTE — ED Provider Notes (Signed)
 [facility] Provider Note    Event Date/Time   First MD Initiated Contact with Patient 05/16/23 1927     (approximate)   History   Flank Pain   HPI  Lindsey Davenport is a 43 y.o. female with PMH of bipolar 1 disorder, GERD, and seizure disorder among other things listed in patient's chart who presents for evaluation of flank pain. Describes it as a sharp, stabbing, internal pain. Patient reports that the pain began on Saturday she was evaluated at urgent care and recommended to come to the emergency department.  Denies urinary symptoms. No abdominal pain.       Physical Exam   Triage Vital Signs: ED Triage Vitals  Encounter Vitals Group     BP 05/16/23 1805 120/86     Systolic BP Percentile --      Diastolic BP Percentile --      Pulse Rate 05/16/23 1805 (!) 56     Resp 05/16/23 1805 17     Temp 05/16/23 1805 98 F (36.7 C)     Temp Source 05/16/23 1805 Oral     SpO2 05/16/23 1805 100 %     Weight 05/16/23 1806 135 lb (61.2 kg)     Height 05/16/23 1806 5\' 3"  (1.6 m)     Head Circumference --      Peak Flow --      Pain Score 05/16/23 1806 10     Pain Loc --      Pain Education --      Exclude from Growth Chart --     Most recent vital signs: Vitals:   05/16/23 1805  BP: 120/86  Pulse: (!) 56  Resp: 17  Temp: 98 F (36.7 C)  SpO2: 100%   General: Awake, no distress.  CV:  Good peripheral perfusion. RRR. Resp:  Normal effort. CTAB. Abd:  No distention. No CVA tenderness.   ED Results / Procedures / Treatments   Labs (all labs ordered are listed, but only abnormal results are displayed) Labs Reviewed  URINALYSIS, ROUTINE W REFLEX MICROSCOPIC - Abnormal; Notable for the following components:      Result Value   Color, Urine STRAW (*)    APPearance CLEAR (*)    Specific Gravity, Urine 1.002 (*)    All other components within normal limits  COMPREHENSIVE METABOLIC PANEL WITH GFR - Abnormal; Notable for the following  components:   Total Protein 5.7 (*)    All other components within normal limits  LIPASE, BLOOD - Abnormal; Notable for the following components:   Lipase 157 (*)    All other components within normal limits  CBC WITH DIFFERENTIAL/PLATELET  PREGNANCY, URINE    RADIOLOGY  CT abdomen pelvis obtained, I interpreted the images as well as reviewed the radiologist report.  Images were negative for any acute abnormalities.  No evidence of nephrolithiasis.   PROCEDURES:  Critical Care performed: No  Procedures   MEDICATIONS ORDERED IN ED: Medications - No data to display   IMPRESSION / MDM / ASSESSMENT AND PLAN / ED COURSE  I reviewed the triage vital signs and the nursing notes.                             43 year old female presents for evaluation of right sided flank pain. VSS aside from low heart rate. Patient NAD on exam.  Differential diagnosis includes, but is not limited to, nephrolithiasis,  pyelonephritis, muscle strain, UTI, biliary colic, pancreatitis.  Patient's presentation is most consistent with acute complicated illness / injury requiring diagnostic workup.  UA is negative for infection. Pregnancy test is negative.   CBC and CMP unremarkable. Lipase is elevated.   CT Abdomen Pelvis is negative for any acute abnormalities, specifically no nephrolithiasis or evidence of pancreatitis.  I am not sure why patient's lipase is elevated.  She denies history of alcohol use.  She does not take any medications that would cause this.  She is not having any epigastric pain.  No recent pancreatic instrumentation.  No history of pancreatitis. Recommended she follow up with primary care in a couple of weeks for a lipase re-check.   As far as her flank pain goes, I recommended patient take Tylenol and ibuprofen as well as topical pain relievers.  She voiced understanding, all questions were answered and she is stable at discharge.      FINAL CLINICAL IMPRESSION(S) / ED DIAGNOSES    Final diagnoses:  Right flank pain     Rx / DC Orders   ED Discharge Orders     None        Note:  This document was prepared using Dragon voice recognition software and may include unintentional dictation errors.   Phyliss Breen, PA-C 05/16/23 2126    Twilla Galea, MD 05/16/23 [phone]

## 2023-05-16 NOTE — ED Notes (Signed)
 Pt is CAOx4, breathing normally, and normal in color. Pt is relaxing in bed and denies any needs. Pain assessed and pt advised she does not have any pain at this moment. I followed up and asked if pain was at a zero, and pt replied "no but I'm okay". PT given a warm blanket for comfort.
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
## 2023-05-16 NOTE — Discharge Instructions (Signed)
 You can take 650 mg of Tylenol and 600 mg of ibuprofen every 6 hours as needed for pain. You can use ice, heat, muscle creams and other topical pain relievers as well.  Please schedule a follow up appointment with your primary care provider for a repeat lipase in a couple weeks.

## 2023-05-25 ENCOUNTER — Other Ambulatory Visit: Payer: Self-pay | Admitting: Gastroenterology

## 2023-05-27 ENCOUNTER — Inpatient Hospital Stay: Payer: Medicare (Managed Care) | Admitting: Internal Medicine

## 2023-07-13 ENCOUNTER — Encounter: Payer: Self-pay | Admitting: Internal Medicine

## 2023-07-25 ENCOUNTER — Ambulatory Visit: Payer: Self-pay | Admitting: Internal Medicine

## 2023-07-25 ENCOUNTER — Ambulatory Visit (INDEPENDENT_AMBULATORY_CARE_PROVIDER_SITE_OTHER): Payer: Medicare (Managed Care) | Admitting: Internal Medicine

## 2023-07-25 ENCOUNTER — Encounter: Payer: Self-pay | Admitting: Internal Medicine

## 2023-07-25 VITALS — BP 130/78 | Ht 63.0 in | Wt 148.2 lb

## 2023-07-25 DIAGNOSIS — R109 Unspecified abdominal pain: Secondary | ICD-10-CM

## 2023-07-25 DIAGNOSIS — M797 Fibromyalgia: Secondary | ICD-10-CM

## 2023-07-25 DIAGNOSIS — L739 Follicular disorder, unspecified: Secondary | ICD-10-CM | POA: Diagnosis not present

## 2023-07-25 LAB — POCT URINE DIPSTICK
Bilirubin, UA: NEGATIVE
Blood, UA: NEGATIVE
Glucose, UA: NEGATIVE mg/dL
Ketones, POC UA: NEGATIVE mg/dL
Leukocytes, UA: NEGATIVE
Nitrite, UA: NEGATIVE
POC PROTEIN,UA: NEGATIVE
Spec Grav, UA: 1.015 (ref 1.010–1.025)
Urobilinogen, UA: 0.2 U/dL
pH, UA: 6 (ref 5.0–8.0)

## 2023-07-25 NOTE — Patient Instructions (Signed)
Folliculitis  Folliculitis occurs when hair follicles become inflamed. A hair follicle is a tiny opening in your skin where your hair grows from. This condition often occurs on the scalp, thighs, legs, back, and buttocks but can happen anywhere on the body. What are the causes? A common cause of this condition is an infection from bacteria. The type of folliculitis caused by bacteria can last a long time or go away and come back. The bacteria can live anywhere on your skin. They are often found in the nostrils. Other causes may include: An infection from a fungus. An infection from a virus. Your skin touching some chemicals, such as oils and tars. Shaving or waxing. Greasy ointments or creams put on the skin. What increases the risk? You are more likely to develop this condition if: Your body has a weak disease-fighting system (immune system). You have diabetes. You are obese. What are the signs or symptoms? Symptoms of this condition include: Redness. Soreness. Swelling. Itching. Small white or yellow, itchy spots filled with pus (pustules) that appear over a red area. If the infection goes deep into the follicle, these may turn into a boil (furuncle). A group of boils (carbuncle). These tend to form in hairy, sweaty areas of the body. How is this diagnosed? This condition is diagnosed with a skin exam. Your health care provider may take a sample of one of the pustules or boils to test in a lab. How is this treated? This condition may be treated by: Putting a warm, wet cloth (warm compress) on the affected areas. Taking antibiotics or applying them to the skin. Applying or bathing with a solution that kills germs (antiseptic). Taking an over-the-counter medicine. This can help with itching. Having a procedure to drain pustules or boils. This may be done if a pustule or boil contains a lot of pus or fluid. Having laser hair removal. This may be done when the condition lasts for a  long time. Follow these instructions at home: Managing pain and swelling  If directed, apply heat to the affected area as often as told by your health care provider. Use the heat source that your health care provider recommends, such as a moist heat pack or a heating pad. Place a towel between your skin and the heat source. Leave the heat on for 20-30 minutes. If your skin turns bright red, remove the heat right away to prevent burns. The risk of burns is higher if you cannot feel pain, heat, or cold. General instructions Take over-the-counter and prescription medicines only as told by your health care provider. If you were prescribed antibiotics, take or apply them as told by your health care provider. Do not stop using the antibiotic even if you start to feel better. Check your irritated area every day for signs of infection. Check for: More redness, swelling, or pain. Fluid or blood. Warmth. Pus or a bad smell. Do not shave irritated skin. Keep all follow-up visits. Your health care provider will check if the treatments are helping. Contact a health care provider if: You have a fever. You have any signs of infection. Red streaks are spreading from the affected area. This information is not intended to replace advice given to you by your health care provider. Make sure you discuss any questions you have with your health care provider. Document Revised: 06/23/2021 Document Reviewed: 06/23/2021 Elsevier Patient Education  2024 [employer].
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
## 2023-08-08 ENCOUNTER — Encounter: Payer: Self-pay | Admitting: Internal Medicine
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
## 2023-08-08 NOTE — Patient Instructions (Signed)
 Preventive Care 16-44 Years Old, Female  Preventive care refers to lifestyle choices and visits with your health care provider that can promote health and wellness. Preventive care visits are also called wellness exams.  What can I expect for my preventive care visit?  Counseling  Your health care provider may ask you questions about your:  Medical history, including:  Past medical problems.  Family medical history.  Pregnancy history.  Current health, including:  Menstrual cycle.  Method of birth control.  Emotional well-being.  Home life and relationship well-being.  Sexual activity and sexual health.  Lifestyle, including:  Alcohol, nicotine or tobacco, and drug use.  Access to firearms.  Diet, exercise, and sleep habits.  Work and work Astronomer.  Sunscreen use.  Safety issues such as seatbelt and bike helmet use.  Physical exam  Your health care provider will check your:  Height and weight. These may be used to calculate your BMI (body mass index). BMI is a measurement that tells if you are at a healthy weight.  Waist circumference. This measures the distance around your waistline. This measurement also tells if you are at a healthy weight and may help predict your risk of certain diseases, such as type 2 diabetes and high blood pressure.  Heart rate and blood pressure.  Body temperature.  Skin for abnormal spots.  What immunizations do I need?    Vaccines are usually given at various ages, according to a schedule. Your health care provider will recommend vaccines for you based on your age, medical history, and lifestyle or other factors, such as travel or where you work.  What tests do I need?  Screening  Your health care provider may recommend screening tests for certain conditions. This may include:  Lipid and cholesterol levels.  Diabetes screening. This is done by checking your blood sugar (glucose) after you have not eaten for a while (fasting).  Pelvic exam and Pap test.  Hepatitis B test.  Hepatitis C  test.  HIV (human immunodeficiency virus) test.  STI (sexually transmitted infection) testing, if you are at risk.  Lung cancer screening.  Colorectal cancer screening.  Mammogram. Talk with your health care provider about when you should start having regular mammograms. This may depend on whether you have a family history of breast cancer.  BRCA-related cancer screening. This may be done if you have a family history of breast, ovarian, tubal, or peritoneal cancers.  Bone density scan. This is done to screen for osteoporosis.  Talk with your health care provider about your test results, treatment options, and if necessary, the need for more tests.  Follow these instructions at home:  Eating and drinking    Eat a diet that includes fresh fruits and vegetables, whole grains, lean protein, and low-fat dairy products.  Take vitamin and mineral supplements as recommended by your health care provider.  Do not drink alcohol if:  Your health care provider tells you not to drink.  You are pregnant, may be pregnant, or are planning to become pregnant.  If you drink alcohol:  Limit how much you have to 0-1 drink a day.  Know how much alcohol is in your drink. In the [place] one drink equals one 12 oz bottle of beer (355 mL), one 5 oz glass of wine (148 mL), or one 1 oz glass of hard liquor (44 mL).  Lifestyle  Brush your teeth every morning and night with fluoride toothpaste. Floss one time each day.  Exercise for at least  30 minutes 5 or more days each week.  Do not use any products that contain nicotine or tobacco. These products include cigarettes, chewing tobacco, and vaping devices, such as e-cigarettes. If you need help quitting, ask your health care provider.  Do not use drugs.  If you are sexually active, practice safe sex. Use a condom or other form of protection to prevent STIs.  If you do not wish to become pregnant, use a form of birth control. If you plan to become pregnant, see your health care provider for a  prepregnancy visit.  Take aspirin only as told by your health care provider. Make sure that you understand how much to take and what form to take. Work with your health care provider to find out whether it is safe and beneficial for you to take aspirin daily.  Find healthy ways to manage stress, such as:  Meditation, yoga, or listening to music.  Journaling.  Talking to a trusted person.  Spending time with friends and family.  Minimize exposure to UV radiation to reduce your risk of skin cancer.  Safety  Always wear your seat belt while driving or riding in a vehicle.  Do not drive:  If you have been drinking alcohol. Do not ride with someone who has been drinking.  When you are tired or distracted.  While texting.  If you have been using any mind-altering substances or drugs.  Wear a helmet and other protective equipment during sports activities.  If you have firearms in your house, make sure you follow all gun safety procedures.  Seek help if you have been physically or sexually abused.  What's next?  Visit your health care provider once a year for an annual wellness visit.  Ask your health care provider how often you should have your eyes and teeth checked.  Stay up to date on all vaccines.  This information is not intended to replace advice given to you by your health care provider. Make sure you discuss any questions you have with your health care provider.  Document Revised: 07/16/2020 Document Reviewed: 07/16/2020  Elsevier Patient Education  2024 [employer].

## 2023-08-10 ENCOUNTER — Other Ambulatory Visit (HOSPITAL_COMMUNITY)
Admission: RE | Admit: 2023-08-10 | Discharge: 2023-08-10 | Disposition: A | Discharge status: Home / Self Care | Payer: Medicare (Managed Care) | Source: Ambulatory Visit | Attending: Obstetrics | Admitting: Obstetrics

## 2023-08-10 ENCOUNTER — Ambulatory Visit: Payer: Medicare (Managed Care) | Admitting: Obstetrics

## 2023-08-10 ENCOUNTER — Encounter: Payer: Self-pay | Admitting: Obstetrics

## 2023-08-10 VITALS — BP 114/86 | HR 54 | Ht 63.0 in | Wt 146.2 lb

## 2023-08-10 DIAGNOSIS — Z1331 Encounter for screening for depression: Secondary | ICD-10-CM

## 2023-08-10 DIAGNOSIS — Z1272 Encounter for screening for malignant neoplasm of vagina: Secondary | ICD-10-CM | POA: Diagnosis not present

## 2023-08-10 DIAGNOSIS — Z01419 Encounter for gynecological examination (general) (routine) without abnormal findings: Secondary | ICD-10-CM

## 2023-08-10 DIAGNOSIS — Z113 Encounter for screening for infections with a predominantly sexual mode of transmission: Secondary | ICD-10-CM

## 2023-08-10 DIAGNOSIS — Z124 Encounter for screening for malignant neoplasm of cervix: Secondary | ICD-10-CM | POA: Diagnosis present

## 2023-08-10 DIAGNOSIS — Z1151 Encounter for screening for human papillomavirus (HPV): Secondary | ICD-10-CM | POA: Insufficient documentation

## 2023-08-11 LAB — HEP, RPR, HIV PANEL
HIV Screen 4th Generation wRfx: NONREACTIVE
Hepatitis B Surface Ag: NEGATIVE
RPR Ser Ql: NONREACTIVE

## 2023-08-15 ENCOUNTER — Encounter: Payer: Self-pay | Admitting: Obstetrics

## 2023-08-18 LAB — CYTOLOGY - PAP
Adequacy: ABSENT
Chlamydia: NEGATIVE
Comment: NEGATIVE
Comment: NEGATIVE
Comment: NEGATIVE
Comment: NORMAL
Diagnosis: REACTIVE
High risk HPV: NEGATIVE
Neisseria Gonorrhea: NEGATIVE
Trichomonas: NEGATIVE

## 2023-08-22 ENCOUNTER — Ambulatory Visit: Payer: Self-pay | Admitting: Obstetrics

## 2023-09-14 ENCOUNTER — Other Ambulatory Visit: Payer: Self-pay | Admitting: Internal Medicine

## 2023-09-16 NOTE — Telephone Encounter (Signed)
 Requested Prescriptions  Pending Prescriptions Disp Refills   fluticasone  (FLONASE ) 50 MCG/ACT nasal spray [Pharmacy Med Name: FLUTICASONE  PROP 50 MCG SPRAY] 48 mL 1    Sig: PLACE 2 SPRAYS INTO BOTH NOSTRILS DAILY. USE FOR 4-6 WEEKS THEN STOP AND USE SEASONALLY OR AS NEEDED.     Ear, Nose, and Throat: Nasal Preparations - Corticosteroids Passed - 09/16/2023 12:47 PM      Passed - Valid encounter within last 12 months    Recent Outpatient Visits           1 month ago Right flank pain   [place] [facility] [place], Angeline ORN, NP   4 months ago Prediabetes   [facility] [place], Angeline ORN, [place]

## 2023-09-23 ENCOUNTER — Ambulatory Visit (INDEPENDENT_AMBULATORY_CARE_PROVIDER_SITE_OTHER): Payer: Medicare (Managed Care) | Admitting: Internal Medicine

## 2023-09-23 ENCOUNTER — Encounter: Payer: Self-pay | Admitting: Internal Medicine

## 2023-09-23 VITALS — BP 128/74 | Ht 63.0 in | Wt 145.4 lb

## 2023-09-23 DIAGNOSIS — E782 Mixed hyperlipidemia: Secondary | ICD-10-CM | POA: Diagnosis not present

## 2023-09-23 DIAGNOSIS — R7303 Prediabetes: Secondary | ICD-10-CM | POA: Diagnosis not present

## 2023-09-23 DIAGNOSIS — Z0001 Encounter for general adult medical examination with abnormal findings: Secondary | ICD-10-CM | POA: Diagnosis not present

## 2023-09-23 DIAGNOSIS — E039 Hypothyroidism, unspecified: Secondary | ICD-10-CM

## 2023-09-23 DIAGNOSIS — Z6825 Body mass index (BMI) 25.0-25.9, adult: Secondary | ICD-10-CM

## 2023-09-23 DIAGNOSIS — E663 Overweight: Secondary | ICD-10-CM

## 2023-09-23 NOTE — Patient Instructions (Signed)
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
## 2023-09-23 NOTE — Assessment & Plan Note (Signed)
 Encouraged diet and exercise for weight loss ?

## 2023-09-24 LAB — CBC
HCT: 43.8 % (ref 35.0–45.0)
Hemoglobin: 14.6 g/dL (ref 11.7–15.5)
MCH: 31.6 pg (ref 27.0–33.0)
MCHC: 33.3 g/dL (ref 32.0–36.0)
MCV: 94.8 fL (ref 80.0–100.0)
MPV: 11.4 fL (ref 7.5–12.5)
Platelets: 176 Thousand/uL (ref 140–400)
RBC: 4.62 Million/uL (ref 3.80–5.10)
RDW: 13 % (ref 11.0–15.0)
WBC: 3.9 Thousand/uL (ref 3.8–10.8)

## 2023-09-24 LAB — COMPREHENSIVE METABOLIC PANEL WITH GFR
AG Ratio: 2.2 (calc) (ref 1.0–2.5)
ALT: 18 U/L (ref 6–29)
AST: 19 U/L (ref 10–30)
Albumin: 4.7 g/dL (ref 3.6–5.1)
Alkaline phosphatase (APISO): 68 U/L (ref 31–125)
BUN: 14 mg/dL (ref 7–25)
CO2: 28 mmol/L (ref 20–32)
Calcium: 9.9 mg/dL (ref 8.6–10.2)
Chloride: 107 mmol/L (ref 98–110)
Creat: 0.78 mg/dL (ref 0.50–0.99)
Globulin: 2.1 g/dL (ref 1.9–3.7)
Glucose, Bld: 85 mg/dL (ref 65–99)
Potassium: 4 mmol/L (ref 3.5–5.3)
Sodium: 142 mmol/L (ref 135–146)
Total Bilirubin: 0.9 mg/dL (ref 0.2–1.2)
Total Protein: 6.8 g/dL (ref 6.1–8.1)
eGFR: 96 mL/min/1.73m2 (ref >=60)

## 2023-09-24 LAB — HEMOGLOBIN A1C
Hgb A1c MFr Bld: 5.7 % — ABNORMAL HIGH (ref <5.7)
Mean Plasma Glucose: 117 mg/dL
eAG (mmol/L): 6.5 mmol/L

## 2023-09-24 LAB — T4, FREE: Free T4: 1 ng/dL (ref 0.8–1.8)

## 2023-09-24 LAB — LIPID PANEL
Cholesterol: 174 mg/dL (ref <200)
HDL: 59 mg/dL (ref >=50)
LDL Cholesterol (Calc): 98 mg/dL
Non-HDL Cholesterol (Calc): 115 mg/dL (ref <130)
Total CHOL/HDL Ratio: 2.9 (calc) (ref <5.0)
Triglycerides: 77 mg/dL (ref <150)

## 2023-09-24 LAB — TSH: TSH: 19.09 m[IU]/L — ABNORMAL HIGH

## 2023-09-26 ENCOUNTER — Ambulatory Visit: Payer: Self-pay | Admitting: Internal Medicine

## 2023-09-26 DIAGNOSIS — R7989 Other specified abnormal findings of blood chemistry: Secondary | ICD-10-CM

## 2023-10-04 ENCOUNTER — Ambulatory Visit (INDEPENDENT_AMBULATORY_CARE_PROVIDER_SITE_OTHER): Payer: Medicare (Managed Care) | Admitting: Internal Medicine

## 2023-10-04 ENCOUNTER — Encounter: Payer: Self-pay | Admitting: Internal Medicine

## 2023-10-04 VITALS — BP 124/74 | Ht 63.0 in | Wt 143.0 lb

## 2023-10-04 DIAGNOSIS — L308 Other specified dermatitis: Secondary | ICD-10-CM | POA: Diagnosis not present

## 2023-10-04 DIAGNOSIS — R21 Rash and other nonspecific skin eruption: Secondary | ICD-10-CM | POA: Diagnosis not present

## 2023-10-04 MED ORDER — TRIAMCINOLONE ACETONIDE 0.025 % EX OINT: 1.0000 | TOPICAL_OINTMENT | Freq: Two times a day (BID) | CUTANEOUS | 0 refills | Status: AC
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
## 2023-10-04 NOTE — Patient Instructions (Signed)
 Itchy, Irritated Skin (Eczema): What to Know Eczema is a group of skin conditions that cause rough and inflamed skin. There are different types of eczema. They each have different triggers, symptoms, and treatments. Eczema is not contagious, so it doesn't spread from person to person. It can appear on different parts of your body at different times. It doesn't look the same on everyone. What are the causes? The exact cause of eczema isn't known. Things that can make it worse includes: Irritants. Allergens. What are the signs or symptoms? Symptoms depend on the type of eczema you have. They can range from mild to very bad. Symptoms often include: Itchiness. Dry skin. Rash or skin bumps. Swelling. Crusty, flaky, or scaly patches. Thick patches of skin. Oozing skin or blisters. How is this diagnosed? This condition may be diagnosed based on: Symptoms. Physical exam. Medical history. Skin patch tests that use allergen patches on your back to check for allergic reactions. You may need to see a skin specialist called a dermatologist. This specialist can help diagnose and treat this condition. How is this treated? There's no cure for eczema, but you can manage your symptoms. Treatment depends on the type of eczema you have. Options may include: Medicines to lessen itching (antihistamines). Medicine to put on your skin to lessen swelling and irritation (corticosteroid creams or ointments). Light therapy, also called phototherapy. The affected skin is put under ultraviolet (UV) light. Medicines may be prescribed or purchased at the store. This will depend on the strength that's needed. Follow these instructions at home: Skin Care Use skin creams or lotions as told. Do not scratch your skin. This can make your rash worse. Keep fingernails short to avoid scratching open the skin. General instructions Take or apply your medicines as told. Avoid triggers and allergens. Treat symptoms fast if  you have a flare-up. Keep all follow-up visits to make sure your treatment plan is working. Where to find more information American Academy of Dermatology: MarketingSheets.si National Eczema Association: nationaleczema.org The Society for Pediatric Dermatology: pedsderm.net Contact a health care provider if: You have very bad itching even with treatment. You often scratch your skin until it bleeds. Your rash looks different than normal. You have a fever. You have symptoms that don't go away with treatment. You have more redness, pain, or swelling over the affected skin. You have warmth or pus coming from the affected skin. This information is not intended to replace advice given to you by your health care provider. Make sure you discuss any questions you have with your health care provider. Document Revised: 06/22/2022 Document Reviewed: 06/22/2022 Elsevier Patient Education  2024 [employer].

## 2023-10-10 ENCOUNTER — Ambulatory Visit (INDEPENDENT_AMBULATORY_CARE_PROVIDER_SITE_OTHER): Payer: Medicare (Managed Care) | Admitting: Dermatology

## 2023-10-10 ENCOUNTER — Encounter: Payer: Self-pay | Admitting: Dermatology

## 2023-10-10 DIAGNOSIS — L209 Atopic dermatitis, unspecified: Secondary | ICD-10-CM | POA: Diagnosis not present

## 2023-10-10 DIAGNOSIS — L720 Epidermal cyst: Secondary | ICD-10-CM | POA: Diagnosis not present

## 2023-10-10 MED ORDER — CLOBETASOL PROPIONATE 0.05 % EX CREA: TOPICAL_CREAM | CUTANEOUS | 2 refills | Status: AC

## 2023-10-10 NOTE — Patient Instructions (Addendum)
 Start Clobetasol  cream twice daily to affected areas until resolved then discontinue, resume as directed as needed for flares. Avoid applying to face, groin, and axilla. Use as directed. Long-term use can cause thinning of the skin.  Topical steroids (such as triamcinolone , fluocinolone, fluocinonide, mometasone, clobetasol , halobetasol, betamethasone, hydrocortisone ) can cause thinning and lightening of the skin if they are used for too long in the same area. Your physician has selected the right strength medicine for your problem and area affected on the body. Please use your medication only as directed by your physician to prevent side effects.    Recommend OTC adapalene 0.1% gel pea sized amount to entire face nightly as tolerated.  This can be used to treat acne (whiteheads, blackheads) and milia (tiny firm white cysts).  It may cause dry irritated skin with initial use, and to minimize this, we recommend applying a light moisturizer to face before applying adapalene and/or applying it less frequently.  OTC brands include Differin 0.1% gel (Galderma), Adapalene 0.1% gel (Neutrogena), and Effaclar gel ( La Roche Posay).  They are found in the acne section on the pharmacy.  Twice a week for a month, three times a week for a month, 4 times a week for a month; gradually increase to every night as tolerated.     Gentle Skin Care Guide  1. Bathe no more than once a day.  2. Avoid bathing in hot water  3. Use a mild soap like Dove, Vanicream, Cetaphil, CeraVe. Can use Lever 2000 or Cetaphil antibacterial soap  4. Use soap only where you need it. On most days, use it under your arms, between your legs, and on your feet. Let the water rinse other areas unless visibly dirty.  5. When you get out of the bath/shower, use a towel to gently blot your skin dry, don't rub it.  6. While your skin is still a little damp, apply a moisturizing cream such as Vanicream, CeraVe, Cetaphil, Eucerin, Sarna lotion  or plain Vaseline Jelly. For hands apply Neutrogena [place] Hand Cream or Excipial Hand Cream.  7. Reapply moisturizer any time you start to itch or feel dry.  8. Sometimes using free and clear laundry detergents can be helpful. Fabric softener sheets should be avoided. Downy Free & Gentle liquid, or any liquid fabric softener that is free of dyes and perfumes, it acceptable to use  9. If your doctor has given you prescription creams you may apply moisturizers over them      Recommend daily broad spectrum sunscreen SPF 30+ to sun-exposed areas, reapply every 2 hours as needed. Call for new or changing lesions.  Staying in the shade or wearing long sleeves, sun glasses (UVA+UVB protection) and wide brim hats (4-inch brim around the entire circumference of the hat) are also recommended for sun protection.     Due to recent changes in healthcare laws, you may see results of your pathology and/or laboratory studies on MyChart before the doctors have had a chance to review them. We understand that in some cases there may be results that are confusing or concerning to you. Please understand that not all results are received at the same time and often the doctors may need to interpret multiple results in order to provide you with the best plan of care or course of treatment. Therefore, we ask that you please give us  2 business days to thoroughly review all your results before contacting the office for clarification. Should we see a critical lab result,  you will be contacted sooner.   If You Need Anything After Your Visit  If you have any questions or concerns for your doctor, please call our main line at [phone] and press option 4 to reach your doctor's medical assistant. If no one answers, please leave a voicemail as directed and we will return your call as soon as possible. Messages left after 4 pm will be answered the following business day.   You may also send us  a message via MyChart. We  typically respond to MyChart messages within 1-2 business days.  For prescription refills, please ask your pharmacy to contact our office. Our fax number is [phone].  If you have an urgent issue when the clinic is closed that cannot wait until the next business day, you can page your doctor at the number below.    Please note that while we do our best to be available for urgent issues outside of office hours, we are not available 24/7.   If you have an urgent issue and are unable to reach us , you may choose to seek medical care at your doctor's office, retail clinic, urgent care center, or emergency room.  If you have a medical emergency, please immediately call 911 or go to the emergency department.  Pager Numbers  - Dr. Hester: [phone]  - Dr. Jackquline: [phone]  - Dr. Claudene: [phone]   - Dr. Raymund: [phone]  In the event of inclement weather, please call our main line at [phone] for an update on the status of any delays or closures.  Dermatology Medication Tips: Please keep the boxes that topical medications come in in order to help keep track of the instructions about where and how to use these. Pharmacies typically print the medication instructions only on the boxes and not directly on the medication tubes.   If your medication is too expensive, please contact our office at [phone] option 4 or send us  a message through MyChart.   We are unable to tell what your co-pay for medications will be in advance as this is different depending on your insurance coverage. However, we may be able to find a substitute medication at lower cost or fill out paperwork to get insurance to cover a needed medication.   If a prior authorization is required to get your medication covered by your insurance company, please allow us  1-2 business days to complete this process.  Drug prices often vary depending on where the prescription is filled and some pharmacies may  offer cheaper prices.  The website www.goodrx.com contains coupons for medications through different pharmacies. The prices here do not account for what the cost may be with help from insurance (it may be cheaper with your insurance), but the website can give you the price if you did not use any insurance.  - You can print the associated coupon and take it with your prescription to the pharmacy.  - You may also stop by our office during regular business hours and pick up a GoodRx coupon card.  - If you need your prescription sent electronically to a different pharmacy, notify our office through [facility] or by phone at [phone] option 4.     Si Usted Necesita Algo Despus de Su Visita  Tambin puede enviarnos un mensaje a travs de Clinical cytogeneticist. Por lo general respondemos a los mensajes de MyChart en el transcurso de 1 a 2 das hbiles.  Para renovar recetas, por favor pida a su farmacia que se ponga en  contacto con nuestra oficina. Randi lakes de fax es [place] [phone].  Si tiene un asunto urgente cuando la clnica est cerrada y que no puede esperar hasta el siguiente da hbil, puede llamar/localizar a su doctor(a) al nmero que aparece a continuacin.   Por favor, tenga en cuenta que aunque hacemos todo lo posible para estar disponibles para asuntos urgentes fuera del horario de [place], no estamos disponibles las 24 horas del da, los 7 [address]  [address] de la [place].   Si tiene un problema urgente y no puede comunicarse con nosotros, puede optar por buscar atencin mdica  en el consultorio de su doctor(a), en una clnica privada, en un centro de atencin urgente o en una sala de emergencias.  Si tiene Engineer, drilling, por favor llame inmediatamente al 911 o vaya a la sala de emergencias.  Nmeros de bper  - Dr. Hester: [phone]  - Dra. Jackquline: [phone]  - Dr. Claudene: [phone]  - Dra. Kitts: [phone]  En caso de inclemencias del [place], por favor llame  a nuestra lnea principal al [phone] para una actualizacin sobre el estado de cualquier retraso o cierre.  Consejos para la medicacin en dermatologa: Por favor, guarde las cajas en las que vienen los medicamentos de uso tpico para ayudarle a seguir las instrucciones sobre dnde y cmo usarlos. Las farmacias generalmente imprimen las instrucciones del medicamento slo en las cajas y no directamente en los tubos del [place].   Si su medicamento es muy caro, por favor, pngase en contacto con landry rieger llamando al [phone] y presione la opcin 4 o envenos un mensaje a travs de Clinical cytogeneticist.   No podemos decirle cul ser su copago por los medicamentos por adelantado ya que esto es diferente dependiendo de la cobertura de su seguro. Sin embargo, es posible que podamos encontrar un medicamento sustituto a Audiological scientist un formulario para que el seguro cubra el medicamento que se considera necesario.   Si se requiere una autorizacin previa para que su compaa de seguros malta su medicamento, por favor permtanos de 1 a 2 das hbiles para completar este proceso.  Los precios de los medicamentos varan con frecuencia dependiendo del Environmental consultant de dnde se surte la receta y alguna farmacias pueden ofrecer precios ms baratos.  El sitio web www.goodrx.com tiene cupones para medicamentos de Health and safety inspector. Los precios aqu no tienen en cuenta lo que podra costar con la ayuda del seguro (puede ser ms barato con su seguro), pero el sitio web puede darle el precio si no utiliz Tourist information centre manager.  - Puede imprimir el cupn correspondiente y llevarlo con su receta a la farmacia.  - Tambin puede pasar por nuestra oficina durante el horario de atencin regular y Education officer, museum una tarjeta de cupones de GoodRx.  - Si necesita que su receta se enve electrnicamente a una farmacia diferente, informe a nuestra oficina a travs de MyChart de [place] o por telfono llamando al [phone] y  presione la opcin 4.

## 2023-10-10 NOTE — Progress Notes (Signed)
   New Patient Visit   Subjective  Lindsey Davenport is a 43 y.o. female who presents for the following: Rash on back and right thumb. Improves then flares again. PCP prescribed Triamcinolone  0.025% ointment. Helps but never goes away. Dur: 2 weeks. No changes prior to this. Does in home health care.   C/O bumps on forehead. No Tx.    The following portions of the chart were reviewed this encounter and updated as appropriate: medications, allergies, medical history  Review of Systems:  No other skin or systemic complaints except as noted in HPI or Assessment and Plan.  Objective  Well appearing patient in no apparent distress; mood and affect are within normal limits.  A focused examination was performed of the following areas: Face, back, hands  Relevant exam findings are noted in the Assessment and Plan.         Assessment & Plan   ATOPIC DERMATITIS, UNSPECIFIED TYPE   MILIA    Atopic Dermatitis, chronic flaring not at goal  Exam: Hypopigmented scaly patches at right thumb over MCP and right upper back near axilla  Atopic dermatitis (eczema) is a chronic, relapsing, pruritic condition that can significantly affect quality of life. It is often associated with allergic rhinitis and/or asthma and can require treatment with topical medications, phototherapy, or in severe cases biologic injectable medication (Dupixent; Adbry) or Oral JAK inhibitors.  Treatment Plan:   Start Clobetasol  cream twice daily to affected areas until resolved then discontinue, resume as directed as needed for flares. Avoid applying to face, groin, and axilla. Use as directed. Long-term use can cause thinning of the skin.  Topical steroids (such as triamcinolone , fluocinolone, fluocinonide, mometasone, clobetasol , halobetasol, betamethasone, hydrocortisone ) can cause thinning and lightening of the skin if they are used for too long in the same area. Your physician has selected the right  strength medicine for your problem and area affected on the body. Please use your medication only as directed by your physician to prevent side effects.   Milia - tiny firm white papules forehead L zygoma - type of cyst - benign - sometimes these will clear with nightly OTC adapalene/Differin 0.1% gel or retinol.  - may be extracted if symptomatic - observe  Start adapalene Twice a week for a month, three times a week for a month, 4 times a week for a month; gradually increase to every night as tolerated. It may cause dry irritated skin with initial use, and to minimize this, we recommend applying a light moisturizer to face before applying adapalene and/or applying it less frequently.  OTC brands include Differin 0.1% gel (Galderma), Adapalene 0.1% gel (Neutrogena), and Effaclar gel ( La Roche Posay).  They are found in the acne section on the pharmacy.   Return if symptoms worsen or fail to improve.  I, Jill Parcell, CMA, am acting as scribe for Boneta Sharps, MD.   Documentation: I have reviewed the above documentation for accuracy and completeness, and I agree with the above.  Boneta Sharps, MD

## 2023-10-14 ENCOUNTER — Other Ambulatory Visit: Payer: Medicare (Managed Care)

## 2023-10-14 DIAGNOSIS — R7989 Other specified abnormal findings of blood chemistry: Secondary | ICD-10-CM

## 2023-10-17 ENCOUNTER — Ambulatory Visit: Payer: Self-pay | Admitting: Internal Medicine

## 2023-10-17 DIAGNOSIS — E039 Hypothyroidism, unspecified: Secondary | ICD-10-CM

## 2023-10-17 DIAGNOSIS — R768 Other specified abnormal immunological findings in serum: Secondary | ICD-10-CM

## 2023-10-17 LAB — THYROID PEROXIDASE ANTIBODIES (TPO) (REFL): Thyroperoxidase Ab SerPl-aCnc: 36 [IU]/mL — ABNORMAL HIGH (ref <9)

## 2023-10-17 LAB — TSH: TSH: 7.3 m[IU]/L — ABNORMAL HIGH

## 2023-10-17 LAB — T3: T3, Total: 95 ng/dL (ref 76–181)

## 2023-10-17 NOTE — Addendum Note (Signed)
 Addended by: ANTONETTE ANGELINE ORN on: 10/17/2023 01:40 PM   Modules accepted: Orders

## 2023-10-21 ENCOUNTER — Ambulatory Visit: Payer: Medicare (Managed Care) | Admitting: Internal Medicine

## 2023-10-22 ENCOUNTER — Other Ambulatory Visit: Payer: Self-pay | Admitting: Internal Medicine

## 2023-10-24 NOTE — Telephone Encounter (Signed)
 Requested Prescriptions  Pending Prescriptions Disp Refills   cetirizine  (ZYRTEC ) 10 MG tablet [Pharmacy Med Name: CETIRIZINE  HCL 10 MG TABLET] 90 tablet 3    Sig: TAKE 1 TABLET BY MOUTH EVERY DAY     Ear, Nose, and Throat:  Antihistamines 2 Passed - 10/24/2023  1:35 PM      Passed - Cr in normal range and within 360 days    Creat  Date Value Ref Range Status  09/23/2023 0.78 0.50 - 0.99 mg/dL Final         Passed - Valid encounter within last 12 months    Recent Outpatient Visits           2 weeks ago Rash of face   [place] [facility] [place], Angeline ORN, NP   1 month ago Encounter for general adult medical examination with abnormal findings   [place] [facility] [place], Angeline ORN, NP   3 months ago Right flank pain   [place] [facility] [place], Angeline ORN, NP   6 months ago Prediabetes   [facility] [place], Angeline ORN, [place]

## 2023-11-07 ENCOUNTER — Other Ambulatory Visit: Payer: Medicare (Managed Care)

## 2023-11-29 ENCOUNTER — Other Ambulatory Visit: Payer: Self-pay | Admitting: Medical Genetics

## 2024-03-23 ENCOUNTER — Ambulatory Visit: Payer: Medicare (Managed Care) | Admitting: Internal Medicine
